# Patient Record
Sex: Female | Born: 1970
Health system: Southern US, Community
[De-identification: ages and names within clinical notes are randomized; demographics above are authoritative.]

## PROBLEM LIST (undated history)

## (undated) ENCOUNTER — Emergency Department (HOSPITAL_COMMUNITY): Payer: PRIVATE HEALTH INSURANCE

## (undated) DIAGNOSIS — Z8489 Family history of other specified conditions: Secondary | ICD-10-CM

## (undated) DIAGNOSIS — M545 Low back pain, unspecified: Secondary | ICD-10-CM

## (undated) DIAGNOSIS — Z9889 Other specified postprocedural states: Secondary | ICD-10-CM

## (undated) DIAGNOSIS — N3010 Interstitial cystitis (chronic) without hematuria: Secondary | ICD-10-CM

## (undated) DIAGNOSIS — G8929 Other chronic pain: Secondary | ICD-10-CM

## (undated) DIAGNOSIS — R002 Palpitations: Secondary | ICD-10-CM

## (undated) DIAGNOSIS — Z5189 Encounter for other specified aftercare: Secondary | ICD-10-CM

## (undated) DIAGNOSIS — L0390 Cellulitis, unspecified: Secondary | ICD-10-CM

## (undated) DIAGNOSIS — T8859XA Other complications of anesthesia, initial encounter: Secondary | ICD-10-CM

## (undated) DIAGNOSIS — B958 Unspecified staphylococcus as the cause of diseases classified elsewhere: Secondary | ICD-10-CM

## (undated) DIAGNOSIS — D649 Anemia, unspecified: Secondary | ICD-10-CM

## (undated) DIAGNOSIS — D689 Coagulation defect, unspecified: Secondary | ICD-10-CM

## (undated) DIAGNOSIS — F909 Attention-deficit hyperactivity disorder, unspecified type: Secondary | ICD-10-CM

## (undated) DIAGNOSIS — R112 Nausea with vomiting, unspecified: Secondary | ICD-10-CM

## (undated) DIAGNOSIS — A419 Sepsis, unspecified organism: Secondary | ICD-10-CM

## (undated) DIAGNOSIS — R51 Headache: Secondary | ICD-10-CM

## (undated) DIAGNOSIS — I2699 Other pulmonary embolism without acute cor pulmonale: Secondary | ICD-10-CM

## (undated) DIAGNOSIS — J189 Pneumonia, unspecified organism: Secondary | ICD-10-CM

## (undated) DIAGNOSIS — I499 Cardiac arrhythmia, unspecified: Secondary | ICD-10-CM

## (undated) DIAGNOSIS — R519 Headache, unspecified: Secondary | ICD-10-CM

## (undated) HISTORY — DX: Other pulmonary embolism without acute cor pulmonale: I26.99

## (undated) HISTORY — DX: Encounter for other specified aftercare: Z51.89

## (undated) HISTORY — DX: Interstitial cystitis (chronic) without hematuria: N30.10

## (undated) HISTORY — DX: Headache, unspecified: R51.9

## (undated) HISTORY — PX: REDUCTION MAMMAPLASTY: SUR839

## (undated) HISTORY — DX: Low back pain, unspecified: M54.50

## (undated) HISTORY — PX: EXPLORATORY LAPAROTOMY: SUR591

## (undated) HISTORY — DX: Anemia, unspecified: D64.9

## (undated) HISTORY — DX: Palpitations: R00.2

## (undated) HISTORY — DX: Headache: R51

## (undated) HISTORY — DX: Coagulation defect, unspecified: D68.9

## (undated) HISTORY — PX: DILATION AND CURETTAGE OF UTERUS: SHX78

## (undated) HISTORY — DX: Cellulitis, unspecified: L03.90

## (undated) HISTORY — PX: UPPER GASTROINTESTINAL ENDOSCOPY: SHX188

## (undated) HISTORY — DX: Other chronic pain: G89.29

## (undated) HISTORY — DX: Sepsis, unspecified organism: A41.9

## (undated) HISTORY — PX: TUBAL LIGATION: SHX77

## (undated) HISTORY — DX: Low back pain: M54.5

## (undated) HISTORY — DX: Headache, unspecified: G89.29

## (undated) HISTORY — DX: Unspecified staphylococcus as the cause of diseases classified elsewhere: B95.8

## (undated) HISTORY — PX: BREAST REDUCTION SURGERY: SHX8

## (undated) HISTORY — DX: Cardiac arrhythmia, unspecified: I49.9

## (undated) HISTORY — PX: RADIOFREQUENCY ABLATION NERVES: SUR1070

---

## 1998-03-06 DIAGNOSIS — I2699 Other pulmonary embolism without acute cor pulmonale: Secondary | ICD-10-CM

## 1998-03-06 HISTORY — DX: Other pulmonary embolism without acute cor pulmonale: I26.99

## 1998-07-20 ENCOUNTER — Inpatient Hospital Stay (HOSPITAL_COMMUNITY): Admission: EM | Admit: 1998-07-20 | Discharge: 1998-08-03 | Payer: Self-pay | Admitting: Emergency Medicine

## 1998-07-20 ENCOUNTER — Encounter: Payer: Self-pay | Admitting: Emergency Medicine

## 1998-07-21 ENCOUNTER — Encounter: Payer: Self-pay | Admitting: Pulmonary Disease

## 1998-07-23 ENCOUNTER — Encounter: Payer: Self-pay | Admitting: Infectious Diseases

## 1998-07-26 ENCOUNTER — Encounter: Payer: Self-pay | Admitting: Pulmonary Disease

## 1998-07-27 ENCOUNTER — Encounter: Payer: Self-pay | Admitting: Pulmonary Disease

## 1998-07-30 ENCOUNTER — Encounter: Payer: Self-pay | Admitting: Pulmonary Disease

## 1998-08-19 ENCOUNTER — Encounter: Payer: Self-pay | Admitting: Emergency Medicine

## 1998-08-19 ENCOUNTER — Emergency Department (HOSPITAL_COMMUNITY): Admission: EM | Admit: 1998-08-19 | Discharge: 1998-08-19 | Payer: Self-pay | Admitting: Emergency Medicine

## 1998-09-01 ENCOUNTER — Encounter: Admission: RE | Admit: 1998-09-01 | Discharge: 1998-09-01 | Payer: Self-pay | Admitting: Infectious Diseases

## 1998-09-29 ENCOUNTER — Encounter: Admission: RE | Admit: 1998-09-29 | Discharge: 1998-09-29 | Payer: Self-pay | Admitting: Infectious Diseases

## 1998-10-18 ENCOUNTER — Ambulatory Visit (HOSPITAL_COMMUNITY): Admission: RE | Admit: 1998-10-18 | Discharge: 1998-10-18 | Payer: Self-pay | Admitting: Pulmonary Disease

## 2001-08-02 ENCOUNTER — Encounter: Admission: RE | Admit: 2001-08-02 | Discharge: 2001-08-02 | Payer: Self-pay | Admitting: *Deleted

## 2002-02-17 ENCOUNTER — Observation Stay (HOSPITAL_COMMUNITY): Admission: AD | Admit: 2002-02-17 | Discharge: 2002-02-18 | Payer: Self-pay | Admitting: Obstetrics and Gynecology

## 2002-02-17 ENCOUNTER — Encounter: Payer: Self-pay | Admitting: Obstetrics and Gynecology

## 2002-07-03 ENCOUNTER — Inpatient Hospital Stay (HOSPITAL_COMMUNITY): Admission: AD | Admit: 2002-07-03 | Discharge: 2002-07-03 | Payer: Self-pay | Admitting: Obstetrics and Gynecology

## 2002-08-06 ENCOUNTER — Encounter (INDEPENDENT_AMBULATORY_CARE_PROVIDER_SITE_OTHER): Payer: Self-pay

## 2002-08-06 ENCOUNTER — Inpatient Hospital Stay (HOSPITAL_COMMUNITY): Admission: AD | Admit: 2002-08-06 | Discharge: 2002-08-08 | Payer: Self-pay | Admitting: Obstetrics & Gynecology

## 2002-08-09 ENCOUNTER — Encounter: Admission: RE | Admit: 2002-08-09 | Discharge: 2002-09-08 | Payer: Self-pay | Admitting: Obstetrics & Gynecology

## 2002-10-09 ENCOUNTER — Encounter: Admission: RE | Admit: 2002-10-09 | Discharge: 2002-11-08 | Payer: Self-pay | Admitting: Obstetrics & Gynecology

## 2002-10-23 ENCOUNTER — Other Ambulatory Visit: Admission: RE | Admit: 2002-10-23 | Discharge: 2002-10-23 | Payer: Self-pay | Admitting: Obstetrics & Gynecology

## 2002-11-09 ENCOUNTER — Encounter: Admission: RE | Admit: 2002-11-09 | Discharge: 2002-12-09 | Payer: Self-pay | Admitting: Obstetrics & Gynecology

## 2005-05-04 ENCOUNTER — Ambulatory Visit: Payer: Self-pay | Admitting: Family Medicine

## 2005-05-05 ENCOUNTER — Ambulatory Visit: Payer: Self-pay | Admitting: Family Medicine

## 2005-07-03 ENCOUNTER — Ambulatory Visit: Payer: Self-pay | Admitting: Family Medicine

## 2006-03-22 ENCOUNTER — Encounter (INDEPENDENT_AMBULATORY_CARE_PROVIDER_SITE_OTHER): Payer: Self-pay | Admitting: *Deleted

## 2006-03-22 ENCOUNTER — Ambulatory Visit (HOSPITAL_BASED_OUTPATIENT_CLINIC_OR_DEPARTMENT_OTHER): Admission: RE | Admit: 2006-03-22 | Discharge: 2006-03-22 | Payer: Self-pay | Admitting: Urology

## 2006-04-26 ENCOUNTER — Ambulatory Visit: Payer: Self-pay | Admitting: Family Medicine

## 2006-04-26 LAB — CONVERTED CEMR LAB
ALT: 11 units/L (ref 0–40)
Bilirubin, Direct: 0.1 mg/dL (ref 0.0–0.3)
CO2: 30 meq/L (ref 19–32)
Cholesterol: 164 mg/dL (ref 0–200)
GFR calc Af Amer: 124 mL/min
GFR calc non Af Amer: 102 mL/min
Glucose, Bld: 92 mg/dL (ref 70–99)
HDL: 49.3 mg/dL (ref >39.0)
LDL Cholesterol: 101 mg/dL — ABNORMAL HIGH (ref 0–99)
Sodium: 142 meq/L (ref 135–145)
TSH: 0.99 microintl units/mL (ref 0.35–5.50)
Total CHOL/HDL Ratio: 3.3
Total Protein: 7.1 g/dL (ref 6.0–8.3)
Triglycerides: 71 mg/dL (ref 0–149)

## 2006-05-08 ENCOUNTER — Ambulatory Visit: Payer: Self-pay | Admitting: Family Medicine

## 2006-08-01 ENCOUNTER — Ambulatory Visit: Payer: Self-pay | Admitting: Family Medicine

## 2006-08-02 LAB — CONVERTED CEMR LAB
AST: 18 units/L (ref 0–37)
Albumin: 3.8 g/dL (ref 3.5–5.2)
Alkaline Phosphatase: 58 units/L (ref 39–117)
BUN: 7 mg/dL (ref 6–23)
Basophils Absolute: 0 10*3/uL (ref 0.0–0.1)
Basophils Relative: 0.4 % (ref 0.0–1.0)
CO2: 29 meq/L (ref 19–32)
Chloride: 106 meq/L (ref 96–112)
Creatinine, Ser: 0.7 mg/dL (ref 0.4–1.2)
HCT: 42.5 % (ref 36.0–46.0)
Hemoglobin: 14.5 g/dL (ref 12.0–15.0)
MCHC: 34.1 g/dL (ref 30.0–36.0)
Monocytes Absolute: 0.4 10*3/uL (ref 0.2–0.7)
Monocytes Relative: 6.9 % (ref 3.0–11.0)
Neutrophils Relative %: 78.8 % — ABNORMAL HIGH (ref 43.0–77.0)
RBC: 4.79 M/uL (ref 3.87–5.11)
RDW: 12.2 % (ref 11.5–14.6)
Total Bilirubin: 0.6 mg/dL (ref 0.3–1.2)
Total Protein: 6.8 g/dL (ref 6.0–8.3)

## 2006-09-04 ENCOUNTER — Emergency Department (HOSPITAL_COMMUNITY): Admission: EM | Admit: 2006-09-04 | Discharge: 2006-09-04 | Payer: Self-pay | Admitting: Emergency Medicine

## 2006-09-05 ENCOUNTER — Ambulatory Visit: Payer: Self-pay | Admitting: Family Medicine

## 2006-10-19 DIAGNOSIS — F329 Major depressive disorder, single episode, unspecified: Secondary | ICD-10-CM

## 2006-10-19 DIAGNOSIS — I10 Essential (primary) hypertension: Secondary | ICD-10-CM

## 2006-10-19 DIAGNOSIS — R51 Headache: Secondary | ICD-10-CM

## 2006-10-19 DIAGNOSIS — R519 Headache, unspecified: Secondary | ICD-10-CM | POA: Insufficient documentation

## 2006-12-13 ENCOUNTER — Telehealth: Payer: Self-pay | Admitting: Family Medicine

## 2006-12-18 ENCOUNTER — Telehealth: Payer: Self-pay | Admitting: Family Medicine

## 2007-03-21 ENCOUNTER — Telehealth: Payer: Self-pay | Admitting: Family Medicine

## 2007-04-09 ENCOUNTER — Encounter: Admission: RE | Admit: 2007-04-09 | Discharge: 2007-04-09 | Payer: Self-pay | Admitting: Obstetrics & Gynecology

## 2007-07-25 ENCOUNTER — Ambulatory Visit: Payer: Self-pay | Admitting: Family Medicine

## 2007-07-25 DIAGNOSIS — H8110 Benign paroxysmal vertigo, unspecified ear: Secondary | ICD-10-CM

## 2007-09-04 ENCOUNTER — Ambulatory Visit: Payer: Self-pay | Admitting: Family Medicine

## 2007-09-05 LAB — CONVERTED CEMR LAB
AST: 15 units/L (ref 0–37)
Albumin: 4.1 g/dL (ref 3.5–5.2)
Alkaline Phosphatase: 46 units/L (ref 39–117)
BUN: 13 mg/dL (ref 6–23)
CO2: 32 meq/L (ref 19–32)
Chloride: 104 meq/L (ref 96–112)
Eosinophils Absolute: 0.1 10*3/uL (ref 0.0–0.7)
Eosinophils Relative: 1.9 % (ref 0.0–5.0)
GFR calc non Af Amer: 102 mL/min
Hemoglobin, Urine: NEGATIVE
LDL Cholesterol: 96 mg/dL (ref 0–99)
Leukocytes, UA: NEGATIVE
Lymphocytes Relative: 39.5 % (ref 12.0–46.0)
MCV: 91.8 fL (ref 78.0–100.0)
Neutrophils Relative %: 49.4 % (ref 43.0–77.0)
Nitrite: NEGATIVE
Platelets: 292 10*3/uL (ref 150–400)
Potassium: 4.1 meq/L (ref 3.5–5.1)
Specific Gravity, Urine: 1.015 (ref 1.000–1.03)
Total Bilirubin: 0.8 mg/dL (ref 0.3–1.2)
Total CHOL/HDL Ratio: 2.7
Urine Glucose: NEGATIVE mg/dL
Urobilinogen, UA: 0.2 (ref 0.0–1.0)
VLDL: 6 mg/dL (ref 0–40)
WBC: 4 10*3/uL — ABNORMAL LOW (ref 4.5–10.5)

## 2007-09-09 ENCOUNTER — Ambulatory Visit: Payer: Self-pay | Admitting: Family Medicine

## 2007-09-09 DIAGNOSIS — G4700 Insomnia, unspecified: Secondary | ICD-10-CM | POA: Insufficient documentation

## 2007-09-09 DIAGNOSIS — H5710 Ocular pain, unspecified eye: Secondary | ICD-10-CM

## 2007-09-10 ENCOUNTER — Encounter: Payer: Self-pay | Admitting: Family Medicine

## 2007-10-28 ENCOUNTER — Telehealth: Payer: Self-pay | Admitting: Family Medicine

## 2007-12-31 ENCOUNTER — Telehealth: Payer: Self-pay | Admitting: Family Medicine

## 2008-01-01 ENCOUNTER — Ambulatory Visit: Payer: Self-pay | Admitting: Family Medicine

## 2008-01-01 DIAGNOSIS — R42 Dizziness and giddiness: Secondary | ICD-10-CM | POA: Insufficient documentation

## 2008-01-03 ENCOUNTER — Encounter: Admission: RE | Admit: 2008-01-03 | Discharge: 2008-01-03 | Payer: Self-pay | Admitting: Family Medicine

## 2008-01-07 ENCOUNTER — Telehealth: Payer: Self-pay | Admitting: Family Medicine

## 2008-01-08 ENCOUNTER — Ambulatory Visit: Payer: Self-pay | Admitting: Family Medicine

## 2008-01-08 DIAGNOSIS — J209 Acute bronchitis, unspecified: Secondary | ICD-10-CM | POA: Insufficient documentation

## 2008-02-05 ENCOUNTER — Encounter: Payer: Self-pay | Admitting: Family Medicine

## 2008-02-21 ENCOUNTER — Telehealth: Payer: Self-pay | Admitting: Family Medicine

## 2008-03-20 ENCOUNTER — Ambulatory Visit: Payer: Self-pay | Admitting: Family Medicine

## 2008-03-20 DIAGNOSIS — J029 Acute pharyngitis, unspecified: Secondary | ICD-10-CM | POA: Insufficient documentation

## 2008-04-22 ENCOUNTER — Ambulatory Visit: Payer: Self-pay | Admitting: Family Medicine

## 2008-07-10 ENCOUNTER — Telehealth: Payer: Self-pay | Admitting: Family Medicine

## 2009-05-14 ENCOUNTER — Ambulatory Visit: Payer: Self-pay | Admitting: Family Medicine

## 2009-05-14 DIAGNOSIS — F411 Generalized anxiety disorder: Secondary | ICD-10-CM | POA: Insufficient documentation

## 2009-05-14 DIAGNOSIS — F909 Attention-deficit hyperactivity disorder, unspecified type: Secondary | ICD-10-CM | POA: Insufficient documentation

## 2009-05-28 ENCOUNTER — Telehealth: Payer: Self-pay | Admitting: Family Medicine

## 2009-08-04 ENCOUNTER — Telehealth: Payer: Self-pay | Admitting: Family Medicine

## 2009-08-05 ENCOUNTER — Ambulatory Visit: Payer: Self-pay | Admitting: Family Medicine

## 2009-08-05 DIAGNOSIS — R609 Edema, unspecified: Secondary | ICD-10-CM

## 2010-04-05 NOTE — Assessment & Plan Note (Signed)
Summary: bilateral leg swelling/dm   Vital Signs:  Patient profile:   40 year old female Weight:      205 pounds Temp:     98 degrees F oral BP sitting:   130 / 82  (right arm) Cuff size:   large  Vitals Entered By: Sid Falcon LPN (August 05, 452 2:34 PM) CC: Bil leg swelling   History of Present Illness: Last Friday severe sunburn, esp legs.  2 days later edema legs.  No pain. Has tried ice and Celebrex without relief.  Denies dyspnea, fatigue, generalized edeama. Edema slightly worse late in day.     Allergies (verified): No Known Drug Allergies  Past History:  Past Medical History: Last updated: 09/09/2007 Hypertension Insomnia ADHD, hx of Sinusitis Depression Headache sees Dr. Seymour Bars for gyn exams interstitial cystitis, sees Dr. Heloise Purpura  Review of Systems  The patient denies anorexia, fever, weight loss, weight gain, chest pain, syncope, and dyspnea on exertion.    Physical Exam  General:  Well-developed,well-nourished,in no acute distress; alert,appropriate and cooperative throughout examination Neck:  No deformities, masses, or tenderness noted. Lungs:  Normal respiratory effort, chest expands symmetrically. Lungs are clear to auscultation, no crackles or wheezes. Heart:  normal rate, regular rhythm, and no murmur.  normal rate, regular rhythm, and no murmur.   Extremities:  sunburn lower legs bil.  No vesicles.  Trace pitting edema.  Good distal foot pulses. Neurologic:  strength normal in all extremities and sensation intact to light touch.  strength normal in all extremities and sensation intact to light touch.     Impression & Recommendations:  Problem # 1:  LEG EDEMA (ICD-782.3) secondary to sunburn.  Avoid Celebrex to avoid further fluid retention.  Elevate and f/u with primary 2 weeks if no better.  Complete Medication List: 1)  Concerta 54 Mg Tbcr (Methylphenidate hcl) .... Once daily 2)  Alprazolam 0.5 Mg Tabs (Alprazolam) .... Three  times a day as needed  for anxiety 3)  Flexeril 10 Mg Tabs (Cyclobenzaprine hcl) .... Three times a day as needed spasm  Patient Instructions: 1)  Elevate legs frequently. 2)  Stop Celebrex. 3)  Avoid further sun exposure until  this has healed. 4)  Follow up with Dr Clent Ridges if edema not resolved in 2 weeks.

## 2010-04-05 NOTE — Assessment & Plan Note (Signed)
Summary: fu on meds/njr   Vital Signs:  Patient profile:   40 year old female Height:      65.5 inches Weight:      206 pounds BMI:     33.88 Temp:     98.7 degrees F oral BP sitting:   122 / 82  (left arm) Cuff size:   large  Vitals Entered By: Alfred Levins, CMA (May 14, 2009 12:12 PM) CC: renew meds   History of Present Illness: Here for follow up on ADHD, insomnia, and anxiety. Lest year when she took Concerta and Seroquel every day, she felt the best she has felt for years. However she cannot afford to take both of these. Tried Temazepam, but still can't sleep all that well. Now tha tshe is off Concerta, she is very stressed all the time, can't get anything done at home or on the job.   Current Medications (verified): 1)  Concerta 54 Mg  Tbcr (Methylphenidate Hcl) .... Once Daily, May Fill On 09-09-08  Allergies (verified): No Known Drug Allergies  Past History:  Past Medical History: Reviewed history from 09/09/2007 and no changes required. Hypertension Insomnia ADHD, hx of Sinusitis Depression Headache sees Dr. Seymour Bars for gyn exams interstitial cystitis, sees Dr. Heloise Purpura  Review of Systems  The patient denies anorexia, fever, weight loss, weight gain, vision loss, decreased hearing, hoarseness, chest pain, syncope, dyspnea on exertion, peripheral edema, prolonged cough, headaches, hemoptysis, abdominal pain, melena, hematochezia, severe indigestion/heartburn, hematuria, incontinence, genital sores, muscle weakness, suspicious skin lesions, transient blindness, difficulty walking, depression, unusual weight change, abnormal bleeding, enlarged lymph nodes, angioedema, breast masses, and testicular masses.    Physical Exam  General:  Well-developed,well-nourished,in no acute distress; alert,appropriate and cooperative throughout examination Neurologic:  No cranial nerve deficits noted. Station and gait are normal. Plantar reflexes are down-going bilaterally.  DTRs are symmetrical throughout. Sensory, motor and coordinative functions appear intact. Psych:  Oriented X3, memory intact for recent and remote, normally interactive, good eye contact, and moderately anxious.     Impression & Recommendations:  Problem # 1:  INSOMNIA (ICD-780.52)  The following medications were removed from the medication list:    Temazepam 30 Mg Caps (Temazepam) .Marland Kitchen... At bedtime  Problem # 2:  HYPERTENSION (ICD-401.9)  Problem # 3:  ADHD (ICD-314.01)  Problem # 4:  ANXIETY (ICD-300.00)  Her updated medication list for this problem includes:    Alprazolam 0.5 Mg Tabs (Alprazolam) .Marland Kitchen... Three times a day as needed  for anxiety  Complete Medication List: 1)  Concerta 54 Mg Tbcr (Methylphenidate hcl) .... Once daily, may fill on 07-14-09 2)  Alprazolam 0.5 Mg Tabs (Alprazolam) .... Three times a day as needed  for anxiety 3)  Flexeril 10 Mg Tabs (Cyclobenzaprine hcl) .... Three times a day as needed spasm  Patient Instructions: 1)  Get back on Concerta. Try Flexeril for tension HAs. Try Xanax for sleep and for anxiety.  2)  Please schedule a follow-up appointment as needed .  Prescriptions: FLEXERIL 10 MG TABS (CYCLOBENZAPRINE HCL) three times a day as needed spasm  #90 x 5   Entered and Authorized by:   Nelwyn Salisbury MD   Signed by:   Nelwyn Salisbury MD on 05/14/2009   Method used:   Print then Give to Patient   RxID:   [id number] ALPRAZOLAM 0.5 MG TABS (ALPRAZOLAM) three times a day as needed  for anxiety  #90 x 5   Entered and Authorized by:   Tera Mater  Clent Ridges MD   Signed by:   Nelwyn Salisbury MD on 05/14/2009   Method used:   Print then Give to Patient   RxID:   [id number] CONCERTA 54 MG  TBCR (METHYLPHENIDATE HCL) once daily, may fill on 07-14-09  #30 x 0   Entered and Authorized by:   Nelwyn Salisbury MD   Signed by:   Nelwyn Salisbury MD on 05/14/2009   Method used:   Print then Give to Patient   RxID:   [id number] CONCERTA 54 MG  TBCR  (METHYLPHENIDATE HCL) once daily, may fill on 06-14-09  #30 x 0   Entered and Authorized by:   Nelwyn Salisbury MD   Signed by:   Nelwyn Salisbury MD on 05/14/2009   Method used:   Print then Give to Patient   RxID:   [id number] CONCERTA 54 MG  TBCR (METHYLPHENIDATE HCL) once daily  #30 x 0   Entered and Authorized by:   Nelwyn Salisbury MD   Signed by:   Nelwyn Salisbury MD on 05/14/2009   Method used:   Print then Give to Patient   RxID:   [id number]

## 2010-04-05 NOTE — Progress Notes (Signed)
Summary: REQ FOR MED / RX  Phone Note Call from Patient   Caller: Patient  [phone] Reason for Call: Refill Medication, Talk to Nurse, Talk to Doctor Summary of Call: Pt called to adv that she sent in her 3 prescriptions (for 3 mths / 90-days) for med:  Concerta 54 Mg Tbcr (Methylphenidate hcl)  to [facility].... [facility] filled the first script for 30-days but disregarded the other 2 prescriptions (pt was told that they would have been thrown away because they don't keep Rx's on file?) [facility] told pt that the prescription date was the same on all 3 scripts....[facility] sent her 1 bottle of 1 (30 days worth), w/ no refills..... Pt adv that [facility] told her that they will have to have a 90-day / 3 mth prescription for the med instead of 3 separate because they don't keep Rx that are being not being filled??.... Pt states that she tried to explain to them that the med Rx is given in 3 separate scripts for reasons of it being a controlled substance but [facility] would not acknowledge the pts statement.... Pt now needs a Rx for April / May before her medication runs out in 2 wks.  Pt can be reached [phone] with any questions or concerns.  Initial call taken by: Debbra Riding,  May 28, 2009 8:13 AM  Follow-up for Phone Call        no problem. Wrote a rx for the whole 90 on one script Follow-up by: Nelwyn Salisbury MD,  May 28, 2009 1:47 PM  Additional Follow-up for Phone Call Additional follow up Details #1::        Phone Call Completed Additional Follow-up by: Raechel Ache, RN,  May 28, 2009 1:51 PM    New/Updated Medications: CONCERTA 54 MG  TBCR (METHYLPHENIDATE HCL) once daily Prescriptions: CONCERTA 54 MG  TBCR (METHYLPHENIDATE HCL) once daily  #90 x 0   Entered and Authorized by:   Nelwyn Salisbury MD   Signed by:   Nelwyn Salisbury MD on 05/28/2009   Method used:   Print then Give to Patient   RxID:   [id number]

## 2010-04-05 NOTE — Progress Notes (Signed)
Summary: bilateral leg swelling  Phone Note Call from Patient   Caller: Patient Call For: Nelwyn Salisbury MD Summary of Call: Pt got sunburned this weekend,  but has swelling in calves and legs with no apparent bad burn.  No SOB.  Left ear has decreased hearing..... No chest pain.  Appt scheduled tomorrow. Initial call taken by: Lynann Beaver CMA,  August 04, 2009 4:43 PM  Follow-up for Phone Call        noted Follow-up by: Nelwyn Salisbury MD,  August 04, 2009 5:31 PM

## 2010-04-27 ENCOUNTER — Other Ambulatory Visit: Payer: Self-pay | Admitting: Family Medicine

## 2010-04-27 MED ORDER — METHYLPHENIDATE HCL ER (OSM) 54 MG PO TBCR: 54.0000 mg | EXTENDED_RELEASE_TABLET | Freq: Every day | ORAL | Status: AC

## 2010-04-27 MED ORDER — CYCLOBENZAPRINE HCL 10 MG PO TABS: 10.0000 mg | ORAL_TABLET | Freq: Three times a day (TID) | ORAL | Status: AC | PRN

## 2010-04-27 MED ORDER — ALPRAZOLAM 0.5 MG PO TABS
0.5000 mg | ORAL_TABLET | Freq: Three times a day (TID) | ORAL | Status: AC | PRN
Start: 2010-04-27 — End: 2011-04-27

## 2010-04-27 NOTE — Telephone Encounter (Signed)
Pt needs to pick up a written script for meds: flexeril, alprazolam, concerta..No data on file. Med for  Flexeril and alprazolam..... Please write for 90-day supply so she can send same to [facility].Marland KitchenMarland KitchenMarland Kitchen# [id number].

## 2010-04-27 NOTE — Telephone Encounter (Signed)
Last ov when you saw her was March 2011

## 2010-04-27 NOTE — Telephone Encounter (Signed)
Pt notified rx ready for pick up for flexeril concerta and alprazolam

## 2010-04-27 NOTE — Telephone Encounter (Signed)
All written and in your box

## 2010-07-22 NOTE — Op Note (Signed)
[name], [name]             ACCOUNT NO.:  [id number]   MEDICAL RECORD NO.:  [id number]          PATIENT TYPE:  HAMB   LOCATION:                               FACILITY:  [facility]   PHYSICIAN:  Heloise Purpura, MD      DATE OF BIRTH:  1971-02-04   DATE OF PROCEDURE:  03/22/2006  DATE OF DISCHARGE:                               OPERATIVE REPORT   PREOPERATIVE DIAGNOSIS:  Pelvic pain.   POSTOPERATIVE DIAGNOSIS:  Pelvic pain.   PROCEDURE:  1. Cystoscopy.  2. Saline bladder washing.  3. Hydrodistention.   SURGEON:  Crecencio Mc, M.D.   ANESTHESIA:  General.   COMPLICATIONS:  None.   INDICATION:  [name] is a 40 year old female with a long history of  pelvic and suprapubic pain.  She also has significant urinary urgency  and frequency.  The patient has been felt, in the past by other  physicians, to possibly have interstitial cystitis.  However, she has  not proceeded with further evaluation up until recently.  After  discussing options with the patient, the patient elected to proceed with  cystoscopy and hydrodistention for both therapeutic and diagnostic  purposes.  Potential risks and benefits were discussed with the patient  and she consented.   DESCRIPTION OF PROCEDURE:  The patient was taken to the operating room  and a general anesthetic was administered.  She was given preoperative  antibiotics, placed in the dorsal lithotomy position, and prepped and  draped in the usual sterile fashion.   Next cystourethroscopy was performed.  This demonstrated a normal  urethra.  The ureteral orifices were identified in their normal anatomic  position, and effluxing clear urine.  The bladder was carefully  examined.  There was no evidence of any bladder tumors, stones, or other  mucosal pathology.   At this point the bladder was then allowed to fill passively at 80 cm of  water pressure.  The bladder was then emptied; and the bladder capacity  was measured to be 800 mL.   Reinspection of the bladder revealed  multiple glomerulations throughout the bladder.  There was specifically,  no evidence of ulceration.  A saline bladder washing also had been  obtained on the initial cystoscopy.  At this point the bladder was  redistended; and left distended for 8  minutes.  Following this time, the bladder was emptied; and the  procedure was ended.  The patient appeared to tolerate the procedure  well and without complications.  She was able to be awakened and  transferred to the recovery unit in satisfactory condition.           ______________________________  Heloise Purpura, MD  Electronically Signed     LB/MEDQ  D:  03/22/2006  T:  03/22/2006  Job:  [id number]

## 2010-07-22 NOTE — Op Note (Signed)
   [name], [name]                       ACCOUNT NO.:  [id number]   MEDICAL RECORD NO.:  [id number]                   PATIENT TYPE:  INP   LOCATION:  9132                                 FACILITY:  [facility]   PHYSICIAN:  Genia Del, M.D.             DATE OF BIRTH:  Mar 28, 1970   DATE OF PROCEDURE:  08/06/2002  DATE OF DISCHARGE:                                 OPERATIVE REPORT   PREOPERATIVE DIAGNOSIS:  Desire for postpartum bilateral tubal  sterilization.   POSTOPERATIVE DIAGNOSIS:  Desire for postpartum bilateral tubal  sterilization.   PROCEDURE:  Postpartum bilateral tubal sterilization by mini-laparotomy with  modified Pomeroy technique.   SURGEON:  Genia Del, M.D.   ANESTHESIOLOGIST:  Belva Agee, M.D.   DESCRIPTION OF PROCEDURE:  Under epidural anesthesia, the patient was in the  dorsal position.  She was prepped with Hibiclens on the abdominal area and  draped as usual.  An infraumbilical incision is done with the scalpel over 2  cm.  The aponeurosis is opened transversely over 2 cm with the Mayo scissors  and the parietal peritoneum is opened with blunt dissection.  We put the  retractors in place and moved the incision toward the left tube.  The left  tube is grasped with a Babcock and we identified the tube by seeing the  fimbriae distally.  A window is opened at the level of the mesosalpinx.  We  then passed a plain 0 and attached the distal part of the tube at about 3 cm  from the cornua.  A second suture  is passed around the proximal cut of the  tube at about 2 cm from the cornua and attached.  We then transect the tube  between the two sutures with the Metzenbaum scissors and a portion of the  tube is sent to pathology.  Both cut extremities are cauterized with  electrocautery.  Hemostasis is adequate at both levels.  The sutures are  cut.  We proceeded exactly the same way on the right tube.  Hemostasis is  adequate.  We then closed the  aponeurosis with a running suture of 0 Vicryl.  We had complete hemostasis at the level of the subcutaneous tissue with the  electrocautery and we closed the skin with a subcuticular 4-0 Monocryl.  Hemostasis is adequate.  The estimated blood loss was minimal.  No  complications occurred.  The patient was transferred to the recovery room in  good condition.                                               Genia Del, M.D.    ML/MEDQ  D:  08/06/2002  T:  08/06/2002  Job:  [id number]

## 2010-07-22 NOTE — Assessment & Plan Note (Signed)
[facility]                            [facility] OFFICE NOTE   [name]                    MRN:          [id number]  DATE:04/26/2006                            DOB:          15-Jun-1970    This is a 40 year old woman here for a nongynecological physical  examination.  It has been several years since she has had a physical of  this nature.  She has quite a few questions today, and a lot of medical  issues to discuss.  First off, for 10 days she has had headaches, sinus  pressure, postnasal drainage, nonproductive cough, and a low-grade  fever.  She is using over-the-counter agents like Mucinex and drinking  fluids.  Secondly, she has chronic difficulties with sleep.  She has  used Ambien and Lunesta in the past.  Not only did these not work well,  but they made her feel strange with side effects during the day.  She is  interested in trying something else.  She has also tried Valium and  Xanax in the past, and they were not effective either.  Additionally,  she has history of adult ADHD.  We had given her Strattera for about 2  to 2-1/2 years.  She thought this helped slightly, but really did not  help her ADHD that much.  She thought is caused some sedation and made  her feel strange during the day as well.  It certainly did not help her  sleep.  Lastly, she has a history of interstitial cystitis.  She was  recently transferred to Dr. Heloise Purpura for urologic care.  About 3  weeks ago she had cystoscopy and hydrodistention performed on her  bladder.  She thinks this may have helped.  Also, she has a history of  hypertension and of some type of heart arrhythmia.  I am not exactly  clear what this entailed, but she was on medications for a while for  this. She has been off them for some time now.  Over the past 6 months,  she describes episodes occurring once or twice a month of a mild  squeezing type of pressure in her left chest.  This  is accompanied with  shortness of breath, sweats, and occasionally some nausea without  vomiting.  This is not associated with exertion.  It lasts anywhere from  5 minutes to 1 hour at a time, then it goes away.  She does not feel any  skipping, palpitations or racing of heart during these episodes.  She  denies any indigestion or heartburn.  No difficulty swallowing.  She  thinks anxiety may play a role in these episodes, but she is not sure.   For other details of her past medical history, family history, social  history, habits, etc., I refer you to our introductory note with her  dated September 14, 2003.   ALLERGIES:  NONE.   CURRENT MEDICATIONS:  None.   OBJECTIVE:  Height 5 feet 5 inches.  Weight 207.  BP 118/74.  Pulse 80  and regular.  Temperature 97.9 degrees.  GENERAL:  She is  in no acute distress, although she coughs occasionally,  and she is hoarse.  She is quite overweight.  SKIN:  Shows a large number of benign lesions including nevi, seborrheic  keratoses, and freckles.  She does have a suspicious macular lesion on  the medial side of her right knee, however.  It is about 3 cm across.  It is quite nonuniform in its colorations, ranging from light tan to  dark brown, almost black.  The margins are indistinct.  She says it has  been changing in color over the past several months.  Otherwise, eyes are clear.  Ears are clear.  Pharynx is clear.  NECK:  Supple without lymphadenopathy or masses.  LUNGS:  Clear.  CARDIAC:  Rate and rhythm regular without gallops, murmurs or rubs.  Distal pulses are full.  EKG is within normal limits.  ABDOMEN:  Soft.  Normal bowel sounds.  Non-tender.  No masses.  EXTREMITIES:  No clubbing, cyanosis or edema.  NEUROLOGIC EXAM:  Grossly intact.   ASSESSMENT AND PLAN:  1. Complete physical.  We talked about increasing exercise and losing      weight.  She is fasting, so we will check the usual laboratories.  2. History of attention deficit  hyperactivity disorder.  Apparently      stable off medications.  3. Insomnia.  I gave her samples to try Seroquel 50 mg 1 at bedtime      over the next 12 days.  She is to call me and let me know how this      is working.  4. Sinusitis.  I gave her a Z-Pak.  5. Chest pains.  Possibly related to esophageal spasms.  I gave her      samples of Protonix 40 mg.  She is to take this once a day over the      next month.  If this is not successful, we will continue our      workup.  6. Interstitial cystitis.  Per Urology.  7. Suspicious lesion on the right leg.  She will return soon to have      this excised.     Tera Mater. Clent Ridges, MD  Electronically Signed    SAF/MedQ  DD: 04/26/2006  DT: 04/26/2006  Job #: [id number]

## 2010-07-22 NOTE — H&P (Signed)
NAMEJABREE, [name]                       ACCOUNT NO.:  [id number]   MEDICAL RECORD NO.:  [id number]                   PATIENT TYPE:  INP   LOCATION:  9167                                 FACILITY:  [facility]   PHYSICIAN:  Genia Del, M.D.             DATE OF BIRTH:  Oct 04, 1970   DATE OF ADMISSION:  08/06/2002  DATE OF DISCHARGE:                                HISTORY & PHYSICAL   IDENTIFYING INFORMATION:  [name] is a 40 year old G4, P2, A1, expected  date of delivery on 08/12/2002, at 39 weeks and 1 day gestation.   REASON FOR ADMISSION:  Induction; history of pulmonary embolism on Lovenox.   HISTORY OF PRESENT ILLNESS:  Fetal movement positive, no regular uterine  contractions, no vaginal bleeding, no fluid leak, no PIH symptoms, no  abnormal bleeding except for easy bruising on Lovenox.   PAST OBSTETRICAL HISTORY:  G1 June 1991, 18 weeks, therapeutic abortion, no  complications.  February 1994, 40-week spontaneous vaginal delivery of baby  girl 8 pounds 7 ounces.  April 1996, 40-week spontaneous vaginal delivery of  baby boy 7 pounds 6 ounces.   PAST MEDICAL HISTORY:  1. Pulmonary embolism postop breast reduction.  2. History of asthma.  3. History of pregnancy-induced hypertension in 1994.  4. History of cystitis.   PAST SURGICAL HISTORY:  1. Therapeutic abortion June 1991.  2. Breast reduction in 2000 complicated by pulmonary embolism.  3. Unilateral ovarian laparotomy in 1998.  4. Pilonidal cyst in 1997.   FAMILY HISTORY:  Positive for cardiovascular disease, chronic hypertension,  diabetes mellitus.   ALLERGIES:  No known drug allergies   MEDICATIONS:  1. Prenatal vitamins.  2. Zoloft 100 daily.  3. Lovenox 40 mg subcutaneously daily.  4. Ativan 1 mg daily. p.r.n.   SOCIAL HISTORY:  Married, two children, nonsmoker.   HISTORY OF PRESENT PREGNANCY:  First trimester was normal.  The patient  presented at around 12 weeks.  There were difficulties  between patient and  husband who did not want a third baby at first.  Through counseling and with  time, the problems resolved, and the husband was supportive by the end of  pregnancy.  The patient did better on Zoloft.  Consultation with hematology  was done because of history of pulmonary embolism, and the patient was  started on Lovenox which she took throughout her pregnancy.  In the second  trimester, triple tests was within normal limits.  Ultrasound of uterine  anatomy was within normal limits at 20+ weeks.  Placenta was frontal and  normal.  Amniotic fluid normal.  Cervix 4.7 cm and closed.  In the third  trimester, one-hour GTT was normal.  Uterine height responded well  throughout pregnancy.  Blood pressure remained normal.  The patient had no  complication associated with Lovenox.  Her thrombocytic workup was negative.  Group B strep was positive at 35+ weeks.  Labs in the first  trimester showed  hemoglobin at 13.5, platelets 267, blood group O positive, Rh antibody  negative, toxoplasmosis negative, syphilis nonreactive, HBsAg negative, HIV  nonreactive, and rubella immune.   REVIEW OF SYSTEMS:  CONSTITUTIONAL:  Negative.  HEENT: Negative.  CARDIOVASCULAR AND RESPIRATORY: Negative.  GI: Negative.  DERMATOLOGIC,  ENDOCRINE, AND NEUROLOGIC:  Negative.   PHYSICAL EXAMINATION:  GENERAL:  No apparent distress.  VITAL SIGNS:  On admission, blood pressure in the 110s to 120s/70s.  Pulse  regular, normal.  Respiratory rate 20, temperature afebrile.  LUNGS:  Clear bilaterally.  HEART:  Regular cardiac rhythm.  ABDOMEN:  Gravid, cephalic presentation.  PELVIC:  Vaginal exam on admission was 3+, 80 to 90%, vertex, -1, membranes  intact.  Lower limbs normal.  Monitoring: Fetal heart rate reactive,  baseline 140s, no deceleration.  Irregular mild uterine contractions.   IMPRESSION:  Gravida 1, para 2, abortions 1 at 39+ weeks gestation, fetal  well being reassuring, group B  Streptococcus positive, admitted for  induction because of history of pulmonary embolism on Lovenox.  Last dose of  Lovenox was Aug 04, 2002.   PLAN:  Admit to labor and delivery, monitoring, start penicillin G protocol  for group B strep positive, expectant management for probable delivery.  Note that the patient also desires a postpartum tubal ligation; we will  attempt to organize that under epidural.  Note, informed consent was  obtained.                                               Genia Del, M.D.    ML/MEDQ  D:  08/06/2002  T:  08/06/2002  Job:  [id number]

## 2010-07-22 NOTE — H&P (Signed)
   [name], [name]                       ACCOUNT NO.:  [id number]   MEDICAL RECORD NO.:  [id number]                   PATIENT TYPE:  MAT   LOCATION:  [facility]                                 FACILITY:  [facility]   PHYSICIAN:  Lenoard Aden, M.D.             DATE OF BIRTH:  1970-04-30   DATE OF ADMISSION:  02/17/2002  DATE OF DISCHARGE:                                HISTORY & PHYSICAL   CHIEF COMPLAINT:  Nausea and vomiting.   HISTORY OF PRESENT ILLNESS:  The patient is a 40 year old white female G5,  P2 at [redacted] weeks gestation with nausea and vomiting over the last three to  four days and persistent diarrhea.   MEDICATIONS:  Phenergan and prenatal vitamins, poorly tolerated, and Zoloft  and Ambien as needed.   PAST MEDICAL HISTORY:  Remarkable for depression, ovarian cyst surgery,  pilonidal cyst surgery, breast reduction x3 with septic shock and pulmonary  embolism, vaginal delivery x2, termination x2.   REVIEW OF SYSTEMS:  Profound diarrhea, nausea, and vomiting.  No vaginal  bleeding.  No abdominal pain.  The patient's work-up to include coagulopathy  work-up pending by Dr. Myna Hidalgo.  The patient to start Lovenox reportedly in  the near future.   PHYSICAL EXAMINATION:  GENERAL:  She is an ill-appearing white female in no  apparent distress.  VITAL SIGNS:  Temperature 98.6, pulse 84, respirations 16, blood pressure  108/46.  HEENT:  Normal.  LUNGS:  Clear.  HEART:  Regular rate and rhythm.  ABDOMEN:  Soft, nontender.  Uterus consistent with 14 weeks.  No rebound,  guarding.  Normal bowel sounds.  PELVIC:  Deferred.  EXTREMITIES:  No cords.  NEUROLOGIC:  Nonfocal.   LABORATORIES:  Abdominal ultrasound reveals a normal abdomen.  Normal  abdominal gas pattern.  Normal gallbladder ultrasound.  Chem-7 within normal  limits.  Liver function tests within normal limits.  CBC reveals a white  blood cell count of 5, hemoglobin 13.4, platelet count 225,000.   IMPRESSION:  1. A  14 week intrauterine pregnancy.  2. Acute viral gastroenteritis.    PLAN:  Admit for IV fluids, aggressive hydration, and antiemetic therapy.  Will administer diet as tolerated and continuous IV fluids this evening.  Zofran as needed.  Ambien for sleep.                                               Lenoard Aden, M.D.    RJT/MEDQ  D:  02/17/2002  T:  02/17/2002  Job:  [id number]   cc:   [name]

## 2010-12-20 LAB — POCT CARDIAC MARKERS: CKMB, poc: <1 — ABNORMAL LOW

## 2010-12-20 LAB — I-STAT 8, (EC8 V) (CONVERTED LAB)
Acid-base deficit: 1
Bicarbonate: 24
Chloride: 103
HCT: 41
Operator id: 146091
pCO2, Ven: 38.7 — ABNORMAL LOW

## 2010-12-20 LAB — CBC
Platelets: 280
RDW: 13.1

## 2010-12-20 LAB — COMPREHENSIVE METABOLIC PANEL
AST: 18
Albumin: 3.9
Chloride: 102
Creatinine, Ser: 0.71
GFR calc Af Amer: >60
Potassium: 3.7
Sodium: 135
Total Bilirubin: 0.7

## 2010-12-20 LAB — DIFFERENTIAL
Basophils Absolute: 0
Eosinophils Relative: 0
Lymphocytes Relative: 9 — ABNORMAL LOW
Neutro Abs: 6.5
Neutrophils Relative %: 81 — ABNORMAL HIGH

## 2010-12-20 LAB — POCT I-STAT CREATININE: Creatinine, Ser: 0.8

## 2014-01-19 ENCOUNTER — Telehealth: Payer: Self-pay | Admitting: Family Medicine

## 2014-01-19 NOTE — Telephone Encounter (Signed)
Yes I can see him  ?

## 2014-01-19 NOTE — Telephone Encounter (Signed)
Pt said her and husband are pt of your and there son is 2 and is asking if you will except him as a pt.

## 2014-01-20 NOTE — Telephone Encounter (Signed)
Pt has been notified Mom will c/b to schedule .Marland Kitchen

## 2015-01-01 ENCOUNTER — Telehealth: Payer: Self-pay | Admitting: Family Medicine

## 2015-01-01 NOTE — Telephone Encounter (Signed)
Pt not seen you in a long,long time. Would like to come in for a physical And labs.  Will you accept her back as a pt, and for this type of appt?

## 2015-01-01 NOTE — Telephone Encounter (Signed)
Yes I can see her, I see her husband also

## 2015-01-04 NOTE — Telephone Encounter (Signed)
Pt aware, but is driving and will call back in the am to schedule a CPE.

## 2015-01-06 NOTE — Telephone Encounter (Signed)
Pt has been schedule and she is aware md will accept her husband also

## 2015-01-18 ENCOUNTER — Other Ambulatory Visit (INDEPENDENT_AMBULATORY_CARE_PROVIDER_SITE_OTHER): Payer: 59

## 2015-01-18 DIAGNOSIS — Z0000 Encounter for general adult medical examination without abnormal findings: Secondary | ICD-10-CM | POA: Diagnosis not present

## 2015-01-18 LAB — CBC WITH DIFFERENTIAL/PLATELET
Basophils Absolute: 0.1 10*3/uL (ref 0.0–0.1)
Basophils Relative: 1 % (ref 0.0–3.0)
EOS PCT: 2.1 % (ref 0.0–5.0)
Eosinophils Absolute: 0.1 10*3/uL (ref 0.0–0.7)
HEMATOCRIT: 36.8 % (ref 36.0–46.0)
Hemoglobin: 11.9 g/dL — ABNORMAL LOW (ref 12.0–15.0)
LYMPHS ABS: 1.1 10*3/uL (ref 0.7–4.0)
Lymphocytes Relative: 22 % (ref 12.0–46.0)
MCHC: 32.4 g/dL (ref 30.0–36.0)
MCV: 79.8 fl (ref 78.0–100.0)
MONOS PCT: 11.9 % (ref 3.0–12.0)
Monocytes Absolute: 0.6 10*3/uL (ref 0.1–1.0)
Neutro Abs: 3.2 10*3/uL (ref 1.4–7.7)
Neutrophils Relative %: 63 % (ref 43.0–77.0)
PLATELETS: 323 10*3/uL (ref 150.0–400.0)
RBC: 4.61 Mil/uL (ref 3.87–5.11)
RDW: 14.7 % (ref 11.5–15.5)
WBC: 5.1 10*3/uL (ref 4.0–10.5)

## 2015-01-18 LAB — HEPATIC FUNCTION PANEL
ALK PHOS: 61 U/L (ref 39–117)
ALT: 10 U/L (ref 0–35)
AST: 11 U/L (ref 0–37)
Albumin: 4.1 g/dL (ref 3.5–5.2)
BILIRUBIN DIRECT: 0.1 mg/dL (ref 0.0–0.3)
BILIRUBIN TOTAL: 0.4 mg/dL (ref 0.2–1.2)
TOTAL PROTEIN: 7.2 g/dL (ref 6.0–8.3)

## 2015-01-18 LAB — TSH: TSH: 1.48 u[IU]/mL (ref 0.35–4.50)

## 2015-01-18 LAB — LIPID PANEL
CHOL/HDL RATIO: 3
Cholesterol: 141 mg/dL (ref 0–200)
HDL: 52 mg/dL (ref >39.00)
LDL CALC: 80 mg/dL (ref 0–99)
NONHDL: 89.17
Triglycerides: 45 mg/dL (ref 0.0–149.0)
VLDL: 9 mg/dL (ref 0.0–40.0)

## 2015-01-18 LAB — POCT URINALYSIS DIPSTICK
BILIRUBIN UA: NEGATIVE
Glucose, UA: NEGATIVE
KETONES UA: NEGATIVE
Leukocytes, UA: NEGATIVE
NITRITE UA: NEGATIVE
PH UA: 7
Protein, UA: NEGATIVE
RBC UA: NEGATIVE
SPEC GRAV UA: 1.02
Urobilinogen, UA: 0.2

## 2015-01-18 LAB — BASIC METABOLIC PANEL
BUN: 9 mg/dL (ref 6–23)
CO2: 23 meq/L (ref 19–32)
Calcium: 9.4 mg/dL (ref 8.4–10.5)
Chloride: 107 mEq/L (ref 96–112)
Creatinine, Ser: 0.79 mg/dL (ref 0.40–1.20)
GFR: 84.68 mL/min (ref >60.00)
GLUCOSE: 90 mg/dL (ref 70–99)
POTASSIUM: 3.3 meq/L — AB (ref 3.5–5.1)
SODIUM: 138 meq/L (ref 135–145)

## 2015-01-25 ENCOUNTER — Ambulatory Visit (INDEPENDENT_AMBULATORY_CARE_PROVIDER_SITE_OTHER): Payer: 59 | Admitting: Family Medicine

## 2015-01-25 ENCOUNTER — Encounter: Payer: Self-pay | Admitting: Family Medicine

## 2015-01-25 VITALS — BP 135/89 | HR 84 | Temp 98.7°F | Ht 65.5 in | Wt 215.0 lb

## 2015-01-25 DIAGNOSIS — R5382 Chronic fatigue, unspecified: Secondary | ICD-10-CM | POA: Diagnosis not present

## 2015-01-25 DIAGNOSIS — G8929 Other chronic pain: Secondary | ICD-10-CM

## 2015-01-25 DIAGNOSIS — Z0000 Encounter for general adult medical examination without abnormal findings: Secondary | ICD-10-CM

## 2015-01-25 DIAGNOSIS — M545 Low back pain: Secondary | ICD-10-CM

## 2015-01-25 MED ORDER — POTASSIUM CHLORIDE CRYS ER 20 MEQ PO TBCR: 20.0000 meq | EXTENDED_RELEASE_TABLET | Freq: Every day | ORAL | Status: DC

## 2015-01-25 NOTE — Progress Notes (Signed)
Pre visit review using our clinic review tool, if applicable. No additional management support is needed unless otherwise documented below in the visit note. 

## 2015-01-26 ENCOUNTER — Encounter: Payer: Self-pay | Admitting: Family Medicine

## 2015-01-26 DIAGNOSIS — G8929 Other chronic pain: Secondary | ICD-10-CM | POA: Insufficient documentation

## 2015-01-26 DIAGNOSIS — M545 Low back pain: Secondary | ICD-10-CM

## 2015-01-26 LAB — MAGNESIUM: Magnesium: 2.1 mg/dL (ref 1.5–2.5)

## 2015-01-26 LAB — VITAMIN B12: VITAMIN B 12: 170 pg/mL — AB (ref 211–911)

## 2015-01-26 LAB — VITAMIN D 25 HYDROXY (VIT D DEFICIENCY, FRACTURES): VITD: 15.32 ng/mL — AB (ref 30.00–100.00)

## 2015-01-26 MED ORDER — TOPIRAMATE 25 MG PO TABS: 25.0000 mg | ORAL_TABLET | Freq: Every day | ORAL | Status: DC

## 2015-01-26 NOTE — Progress Notes (Signed)
Subjective:    Patient ID: [name], female    DOB: Dec 27, 1970, 44 y.o.   MRN: [id number]  HPI 44 yr old female to re-establish with [place] and for a cpx. She was last seen here in June 2011. Her major health concern for the past 3 years has been lower back pain. She fell while on her job in 2013 and injured her lower back. 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 At first she saw Dr. Amedeo Plenty and Dr. Lanice Schwab at [facility], and then she was directed to see Dr. Arvilla Market, a [facility] doctor. She has been on multiple medications and had numerous rounds of PT with poor results. She has had epidural steroid injections and even radiofrequency ablation of lumbar nerves, but the pain persists. She has been taking Topamax for some time, and her dose is now 50 mg daily. This has not helped at all, and she feels it has caused a lot of side effects like weight gain, headaches, and sedation. She was taking Tramadol for pain but has weaned herself off of this by taking Ibuprofen instead. She is very frustrated by her lack of improvement and she has consulted with a lawyer to help her navigate the Workers Comp system so she can see a new back pain specialist. She gets very little exercise, and she admits to a poor diet.    Review of Systems  Constitutional: Positive for fatigue. Negative for fever, chills, diaphoresis, activity change, appetite change and unexpected weight change.  HENT: Negative.   Eyes: Negative.   Respiratory: Negative.   Cardiovascular: Negative.   Gastrointestinal: Negative.   Genitourinary: Negative for dysuria, urgency, frequency, hematuria, flank pain, decreased urine volume, enuresis, difficulty urinating, pelvic pain and dyspareunia.  Musculoskeletal: Positive for back pain. Negative for myalgias, joint swelling, arthralgias, gait problem, neck pain and neck stiffness.  Skin:  Negative.   Neurological: Negative.   Psychiatric/Behavioral: Negative.        Objective:   Physical Exam  Constitutional: She is oriented to person, place, and time. She appears well-developed and well-nourished. No distress.  HENT:  Head: Normocephalic and atraumatic.  Right Ear: External ear normal.  Left Ear: External ear normal.  Nose: Nose normal.  Mouth/Throat: Oropharynx is clear and moist. No oropharyngeal exudate.  Eyes: Conjunctivae and EOM are normal. Pupils are equal, round, and reactive to light. No scleral icterus.  Neck: Normal range of motion. Neck supple. No JVD present. No thyromegaly present.  Cardiovascular: Normal rate, regular rhythm, normal heart sounds and intact distal pulses.  Exam reveals no gallop and no friction rub.   No murmur heard. Pulmonary/Chest: Effort normal and breath sounds normal. No respiratory distress. She has no wheezes. She has no rales. She exhibits no tenderness.  Abdominal: Soft. Bowel sounds are normal. She exhibits no distension and no mass. There is no tenderness. There is no rebound and no guarding.  Musculoskeletal: Normal range of motion. She exhibits no edema or tenderness.  Lymphadenopathy:    She has no cervical adenopathy.  Neurological: She is alert and oriented to person, place, and time. She has normal reflexes. No cranial nerve deficit. She exhibits normal muscle tone. Coordination normal.  Skin: Skin is warm and dry. No rash noted. No erythema.  Psychiatric: She has a normal mood and affect. Her behavior is normal. Judgment and thought content normal.  Assessment & Plan:  Well exam. We discussed diet and exercise advice. I encouraged her to work with her lawyer to see a new doctor for a second opinion on her back pain. Her labs indicate a low potassium, so she is started on Klor-con 20 mEq daily. She will decrease the dose of Topamax to 25 mg daily. Recheck in one month

## 2015-02-24 ENCOUNTER — Ambulatory Visit (INDEPENDENT_AMBULATORY_CARE_PROVIDER_SITE_OTHER): Payer: 59 | Admitting: Family Medicine

## 2015-02-24 ENCOUNTER — Encounter: Payer: Self-pay | Admitting: Family Medicine

## 2015-02-24 VITALS — BP 113/68 | HR 70 | Temp 98.9°F | Ht 65.5 in | Wt 214.0 lb

## 2015-02-24 DIAGNOSIS — I10 Essential (primary) hypertension: Secondary | ICD-10-CM | POA: Diagnosis not present

## 2015-02-24 DIAGNOSIS — M545 Low back pain, unspecified: Secondary | ICD-10-CM

## 2015-02-24 DIAGNOSIS — G8929 Other chronic pain: Secondary | ICD-10-CM | POA: Diagnosis not present

## 2015-02-24 DIAGNOSIS — E876 Hypokalemia: Secondary | ICD-10-CM | POA: Diagnosis not present

## 2015-02-24 LAB — BASIC METABOLIC PANEL
BUN: 15 mg/dL (ref 6–23)
CHLORIDE: 107 meq/L (ref 96–112)
CO2: 25 mEq/L (ref 19–32)
Calcium: 9.5 mg/dL (ref 8.4–10.5)
Creatinine, Ser: 0.83 mg/dL (ref 0.40–1.20)
GFR: 79.95 mL/min (ref >60.00)
Glucose, Bld: 91 mg/dL (ref 70–99)
POTASSIUM: 4.2 meq/L (ref 3.5–5.1)
SODIUM: 140 meq/L (ref 135–145)

## 2015-02-24 NOTE — Progress Notes (Signed)
Pre visit review using our clinic review tool, if applicable. No additional management support is needed unless otherwise documented below in the visit note. 

## 2015-02-24 NOTE — Progress Notes (Signed)
   Subjective:    Patient ID: [name], female    DOB: March 22, 1970, 44 y.o.   MRN: [id number]  HPI Here to follow up on low potassium and weight issues. She has been on Klor-con for the past month and feels about the same. We ave been tapering off Topamax and she is now down to 25 mg a day. She saw Dr. Chalmers Cater this morning for an Endocrine evaluation and se drew blood samples for lots of tests. It sounds like she has some insulin resistance among other things. She has finally settled the Workers Comp case about her back injury so she can now pursue other treatments.   Review of Systems  Constitutional: Negative.   Respiratory: Negative.   Cardiovascular: Negative.   Endocrine: Negative.   Neurological: Negative.        Objective:   Physical Exam  Constitutional: She is oriented to person, place, and time. She appears well-developed and well-nourished.  Cardiovascular: Normal rate, regular rhythm, normal heart sounds and intact distal pulses.   Pulmonary/Chest: Effort normal and breath sounds normal.  Neurological: She is alert and oriented to person, place, and time.          Assessment & Plan:  We will recheck a BMET today for the potassium level. Stop the Topamax completely at this point. I asked her to have Dr. Almetta Lovely office to forward all her test results to [place].

## 2015-06-28 ENCOUNTER — Telehealth: Payer: Self-pay | Admitting: Family Medicine

## 2015-06-28 NOTE — Telephone Encounter (Signed)
Looks like FYI

## 2015-06-28 NOTE — Telephone Encounter (Signed)
FYI  - Appointment scheduled Wednesday 06/30/15 at 8:45am with Dr. Sarajane Jews

## 2015-06-28 NOTE — Telephone Encounter (Signed)
[facility] Day - Client [place] [facility] Patient Name: [name] DOB: [birth year] Initial Comment Caller states woke up with ABD pain , sweats, now she is having pain on left side and lower back. - has been having problems with BM as well Nurse Assessment Nurse: Dimas Chyle, RN, Dellis Filbert Date/Time Eilene Ghazi Time): 06/28/2015 10:56:45 AM Confirm and document reason for call. If symptomatic, describe symptoms. You must click the next button to save text entered. ---Caller states woke up with ABD pain , sweats, now she is having pain on left side and lower back. - has been having problems with BM as well. Last BM was on Friday. Has the patient traveled out of the country within the last 30 days? ---No Does the patient have any new or worsening symptoms? ---Yes Will a triage be completed? ---Yes Related visit to physician within the last 2 weeks? ---No Does the PT have any chronic conditions? (i.e. diabetes, asthma, etc.) ---No Is the patient pregnant or possibly pregnant? (Ask all females between the ages of 48-55) ---No Is this a behavioral health or substance abuse call? ---No Guidelines Guideline Title Affirmed Question Affirmed Notes Rectal Symptoms MODERATE-SEVERE rectal pain (i.e., interferes with school, work, or sleep) Final Disposition User See Physician within 24 Hours [place], RN, [employer] Referrals REFERRED TO PCP OFFICE Disagree/Comply: Leta Baptist

## 2015-06-29 ENCOUNTER — Encounter: Payer: Self-pay | Admitting: Family Medicine

## 2015-06-29 ENCOUNTER — Ambulatory Visit (INDEPENDENT_AMBULATORY_CARE_PROVIDER_SITE_OTHER): Payer: 59 | Admitting: Family Medicine

## 2015-06-29 VITALS — BP 123/87 | HR 73 | Temp 98.2°F | Ht 65.5 in | Wt 198.0 lb

## 2015-06-29 DIAGNOSIS — R109 Unspecified abdominal pain: Secondary | ICD-10-CM

## 2015-06-29 LAB — CBC WITH DIFFERENTIAL/PLATELET
BASOS PCT: 0.6 % (ref 0.0–3.0)
Basophils Absolute: 0 10*3/uL (ref 0.0–0.1)
EOS PCT: 1.9 % (ref 0.0–5.0)
Eosinophils Absolute: 0.1 10*3/uL (ref 0.0–0.7)
HCT: 35.4 % — ABNORMAL LOW (ref 36.0–46.0)
HEMOGLOBIN: 11.6 g/dL — AB (ref 12.0–15.0)
Lymphocytes Relative: 32.3 % (ref 12.0–46.0)
Lymphs Abs: 1.9 10*3/uL (ref 0.7–4.0)
MCHC: 32.8 g/dL (ref 30.0–36.0)
MCV: 79.9 fl (ref 78.0–100.0)
MONOS PCT: 7.4 % (ref 3.0–12.0)
Monocytes Absolute: 0.4 10*3/uL (ref 0.1–1.0)
Neutro Abs: 3.5 10*3/uL (ref 1.4–7.7)
Neutrophils Relative %: 57.8 % (ref 43.0–77.0)
Platelets: 368 10*3/uL (ref 150.0–400.0)
RBC: 4.43 Mil/uL (ref 3.87–5.11)
RDW: 16 % — AB (ref 11.5–15.5)
WBC: 6 10*3/uL (ref 4.0–10.5)

## 2015-06-29 LAB — BASIC METABOLIC PANEL
BUN: 13 mg/dL (ref 6–23)
CHLORIDE: 103 meq/L (ref 96–112)
CO2: 26 mEq/L (ref 19–32)
Calcium: 9.8 mg/dL (ref 8.4–10.5)
Creatinine, Ser: 0.73 mg/dL (ref 0.40–1.20)
GFR: 92.57 mL/min (ref >60.00)
GLUCOSE: 88 mg/dL (ref 70–99)
POTASSIUM: 3.6 meq/L (ref 3.5–5.1)
SODIUM: 138 meq/L (ref 135–145)

## 2015-06-29 LAB — POC URINALSYSI DIPSTICK (AUTOMATED)
BILIRUBIN UA: NEGATIVE
Blood, UA: NEGATIVE
GLUCOSE UA: NEGATIVE
KETONES UA: NEGATIVE
LEUKOCYTES UA: NEGATIVE
Nitrite, UA: NEGATIVE
Protein, UA: NEGATIVE
Urobilinogen, UA: 0.2
pH, UA: 5.5

## 2015-06-29 NOTE — Progress Notes (Signed)
Pre visit review using our clinic review tool, if applicable. No additional management support is needed unless otherwise documented below in the visit note. 

## 2015-06-29 NOTE — Progress Notes (Signed)
   Subjective:    Patient ID: [name], female    DOB: [birth year], 46 y.o.   MRN: [id number]  HPI Here for 3 days of intermittent left flank and LLQ pain. This is uncomfortable but not severe. No nausea or fever. No urinary urgency or burning. No blood is the urine or stool. BMs are normal.    Review of Systems  Constitutional: Negative.   Respiratory: Negative.   Cardiovascular: Negative.   Gastrointestinal: Positive for abdominal pain. Negative for nausea, vomiting, diarrhea, constipation, blood in stool, abdominal distention, anal bleeding and rectal pain.  Genitourinary: Positive for flank pain. Negative for dysuria, urgency, frequency, hematuria and pelvic pain.       Objective:   Physical Exam  Constitutional: She appears well-developed and well-nourished. No distress.  Cardiovascular: Normal rate, regular rhythm, normal heart sounds and intact distal pulses.   Pulmonary/Chest: Effort normal and breath sounds normal.  Abdominal: Soft. Bowel sounds are normal. She exhibits no distension and no mass. There is no rebound and no guarding.  Mildly tender in the left flank and LLQ          Assessment & Plan:  Left flank pain. Her UA today is clear. Get labs and set up a contrasted CT scan soon. Possible etiologies include diverticulitis or a kidney stone or an ovarian cyst.

## 2015-06-29 NOTE — Addendum Note (Signed)
Addended by: Aggie Hacker A on: 06/29/2015 03:09 PM   Modules accepted: Orders

## 2015-06-30 ENCOUNTER — Ambulatory Visit: Payer: Self-pay | Admitting: Family Medicine

## 2015-07-02 ENCOUNTER — Ambulatory Visit (INDEPENDENT_AMBULATORY_CARE_PROVIDER_SITE_OTHER)
Admission: RE | Admit: 2015-07-02 | Discharge: 2015-07-02 | Disposition: A | Discharge status: Home / Self Care | Payer: 59 | Source: Ambulatory Visit | Attending: Family Medicine | Admitting: Family Medicine

## 2015-07-02 DIAGNOSIS — R109 Unspecified abdominal pain: Secondary | ICD-10-CM

## 2015-07-02 MED ORDER — IOPAMIDOL (ISOVUE-300) INJECTION 61%
100.0000 mL | Freq: Once | INTRAVENOUS | Status: AC | PRN
  Administered 2015-07-02: 100 mL via INTRAVENOUS

## 2015-07-05 NOTE — Addendum Note (Signed)
Addended by: Alysia Penna A on: 07/05/2015 05:02 PM   Modules accepted: Orders

## 2015-07-06 ENCOUNTER — Encounter: Payer: Self-pay | Admitting: Gastroenterology

## 2015-09-02 ENCOUNTER — Encounter: Payer: Self-pay | Admitting: Gastroenterology

## 2015-09-02 ENCOUNTER — Ambulatory Visit (INDEPENDENT_AMBULATORY_CARE_PROVIDER_SITE_OTHER): Payer: 59 | Admitting: Gastroenterology

## 2015-09-02 VITALS — BP 118/76 | HR 76 | Ht 65.75 in | Wt 194.4 lb

## 2015-09-02 DIAGNOSIS — R109 Unspecified abdominal pain: Secondary | ICD-10-CM

## 2015-09-02 DIAGNOSIS — R634 Abnormal weight loss: Secondary | ICD-10-CM | POA: Diagnosis not present

## 2015-09-02 DIAGNOSIS — R194 Change in bowel habit: Secondary | ICD-10-CM

## 2015-09-02 DIAGNOSIS — K625 Hemorrhage of anus and rectum: Secondary | ICD-10-CM

## 2015-09-02 DIAGNOSIS — R6881 Early satiety: Secondary | ICD-10-CM

## 2015-09-02 MED ORDER — NA SULFATE-K SULFATE-MG SULF 17.5-3.13-1.6 GM/177ML PO SOLN: 1.0000 | Freq: Once | ORAL | Status: DC

## 2015-09-02 NOTE — Progress Notes (Signed)
HPI :  45 y/o female here for a new patient visit for complaints of abdominal pain and change in bowel habits  She report abdominal pain located in the left mid abdomen, lateral to the umbilicus.  Pain ongoing for the past 3 months or so. She has some baseline pain present at the site "all the time" which is mild, rated 2/10, but has episodes of severe pain at the site. Pain can flare to 10/10 and be "excrutiating" when she gets. Pain to this level is about once per week. There is no precipitant to it. Not related to eating. No associated nausea or vomiting. It can wake her from sleep, associated with sweats. Severe pain lasts about upwards of 4-5 hours at a time before abating on its own. No trauma to the area. She had a CT scan of the abdomen which did not show a clear source.  She does endorse change in bowel habits. Having a bowel movement can sometimes make her pain worse, but pain is not relieved with a BM. She thinks since January her bowels have changed. She has a BM every 3 days, she has ongoing constipation, and hard to evacuate her stools. Stool form has changed to passing thin stools / ribbons. She sees blood occasionally in the stools. No FH of colon cancer known. No prior colonoscopy. She does not think she has had an upper endoscopy. She has tried miralax in the past which she did not think helped too much when taking it daily but only tried once daily dosing.   She is not interested in food. She has lost 22 lbs over several months, not trying to do so. She is trying to eat high fiber foods. She does feel easily when she eats normal volumes. She has a history of DM, eating smaller portions more frequently which can help. She denies reflux symptoms. She denies heartburn.   She has taken NSAIDs in the past, indocin and ibuprofen, she takes an NSAID once every 3 days or so.     Past Medical History  Diagnosis Date  . Chronic lumbar pain since 2013    sees Dr. Arvilla Market   . Cardiac  arrhythmia   . Chronic headaches   . PE (pulmonary embolism)     due to surgery  . Interstitial cystitis   . Sepsis ([place])     due to surgery  . Staph infection   . Cellulitis      Past Surgical History  Procedure Laterality Date  . Radiofrequency ablation nerves      lumbar spine, per Dr. Ace Gins   . Breast reduction surgery    . Exploratory laparotomy      with left ovary and mass removed   Family History  Problem Relation Age of Onset  . Colon polyps Mother   . Colon polyps Paternal Grandmother   . Colon polyps Maternal Grandmother   . Diabetes Paternal Grandfather   . Diabetes Maternal Grandfather   . Diabetes Sister   . Heart disease Maternal Grandfather   . Heart disease Paternal Grandfather   . Heart Problems Mother   . Heart Problems Sister   . Irritable bowel syndrome Mother   . Irritable bowel syndrome Paternal Aunt    Social History  Substance Use Topics  . Smoking status: Former Smoker -- 1 years    Types: Cigarettes  . Smokeless tobacco: Never Used     Comment: age 60  . Alcohol Use: 0.0 oz/week  0 Standard drinks or equivalent per week     Comment: glass of wine    Current Outpatient Prescriptions  Medication Sig Dispense Refill  . B Complex Vitamins (VITAMIN B COMPLEX PO) Take 1 tablet by mouth daily.    . Cholecalciferol (VITAMIN D) 2000 units tablet Take 2,000 Units by mouth daily.    Marland Kitchen ibuprofen (ADVIL,MOTRIN) 200 MG tablet Take 200 mg by mouth as needed.    . Indomethacin 40 MG CAPS Take 1 capsule by mouth as needed.    . potassium chloride SA (KLOR-CON M20) 20 MEQ tablet Take 1 tablet (20 mEq total) by mouth daily. 90 tablet 3   No current facility-administered medications for this visit.   No Known Allergies   Review of Systems: All systems reviewed and negative except where noted in HPI.   Lab Results  Component Value Date   WBC 6.0 06/29/2015   HGB 11.6* 06/29/2015   HCT 35.4* 06/29/2015   MCV 79.9 06/29/2015   PLT 368.0  06/29/2015    Lab Results  Component Value Date   CREATININE 0.73 06/29/2015   BUN 13 06/29/2015   NA 138 06/29/2015   K 3.6 06/29/2015   CL 103 06/29/2015   CO2 26 06/29/2015    Lab Results  Component Value Date   ALT 10 01/18/2015   AST 11 01/18/2015   ALKPHOS 61 01/18/2015   BILITOT 0.4 01/18/2015     Physical Exam: BP 118/76 mmHg  Pulse 76  Ht 5' 5.75" (1.67 m)  Wt 194 lb 6 oz (88.168 kg)  BMI 31.61 kg/m2  LMP 08/20/2015 Constitutional: Pleasant,well-developed, female in no acute distress. HEENT: Normocephalic and atraumatic. Conjunctivae are normal. No scleral icterus. Neck supple.  Cardiovascular: Normal rate, regular rhythm.  Pulmonary/chest: Effort normal and breath sounds normal. No wheezing, rales or rhonchi. Abdominal: Soft, nondistended, left mid abdomen with mild TTP with negative [place]. Bowel sounds active throughout. There are no masses palpable. No hepatomegaly. Extremities: no edema Lymphadenopathy: No cervical adenopathy noted. Neurological: Alert and oriented to person place and time. Skin: Skin is warm and dry. No rashes noted. Psychiatric: Normal mood and affect. Behavior is normal.   ASSESSMENT AND PLAN: 45 y/o female presenting with left mid abdomen pain as outlined above, also noted to have changes in bowel habits, early satiety, and reported weight loss. Labs show mild microcytic anemia.   CT is reassuring in regards to no significant pathology noted but she has rather dramatic symptoms. She has chronic pain at all times which is mild, but then severe worsening without clear triggers. Abdominal wall pain can present like this, however given her change in bowel habits (to include rectal bleeding), early satiety, and weight loss, I offered her an upper endoscopy and colonoscopy to rule out pathology which could cause her symptoms. After discussing the risks / benefits of endoscopy with her she wished to proceed. In the interim, recommend she take  higher dose miralax, double dose daily to BID and see if this helps constipation. Recommend she otherwise avoid NSAID use in light of her symptoms. Further recommendations pending endoscopy. She agreed.   Donnellson Cellar, MD [facility] Pager [phone]  CC: Laurey Morale, MD

## 2015-09-02 NOTE — Patient Instructions (Signed)
You have been scheduled for an endoscopy and colonoscopy. Please follow the written instructions given to you at your visit today. Please pick up your prep supplies at the pharmacy within the next 1-3 days. If you use inhalers (even only as needed), please bring them with you on the day of your procedure. Your physician has requested that you go to www.startemmi.com and enter the access code given to you at your visit today. This web site gives a general overview about your procedure. However, you should still follow specific instructions given to you by our office regarding your preparation for the procedure.  Increase Miralax

## 2015-09-27 ENCOUNTER — Telehealth: Payer: Self-pay | Admitting: Family Medicine

## 2015-09-27 NOTE — Telephone Encounter (Signed)
Yes I can see him thanks  

## 2015-09-27 NOTE — Telephone Encounter (Signed)
Called and let mom know.  

## 2015-09-27 NOTE — Telephone Encounter (Signed)
Pt would like to know if you would accept her son Labrea Warsaw age 43?  Mom state that the whole family is pts of yours.

## 2015-09-28 ENCOUNTER — Encounter: Payer: Self-pay | Admitting: Gastroenterology

## 2015-10-12 ENCOUNTER — Encounter: Payer: Self-pay | Admitting: Gastroenterology

## 2015-10-12 ENCOUNTER — Ambulatory Visit (AMBULATORY_SURGERY_CENTER): Payer: 59 | Admitting: Gastroenterology

## 2015-10-12 VITALS — BP 117/69 | HR 52 | Temp 98.6°F | Resp 15 | Ht 65.75 in | Wt 194.0 lb

## 2015-10-12 DIAGNOSIS — D122 Benign neoplasm of ascending colon: Secondary | ICD-10-CM | POA: Diagnosis not present

## 2015-10-12 DIAGNOSIS — K2970 Gastritis, unspecified, without bleeding: Secondary | ICD-10-CM

## 2015-10-12 DIAGNOSIS — D125 Benign neoplasm of sigmoid colon: Secondary | ICD-10-CM | POA: Diagnosis not present

## 2015-10-12 DIAGNOSIS — R194 Change in bowel habit: Secondary | ICD-10-CM | POA: Diagnosis not present

## 2015-10-12 DIAGNOSIS — D120 Benign neoplasm of cecum: Secondary | ICD-10-CM | POA: Diagnosis not present

## 2015-10-12 DIAGNOSIS — R109 Unspecified abdominal pain: Secondary | ICD-10-CM | POA: Diagnosis present

## 2015-10-12 DIAGNOSIS — D123 Benign neoplasm of transverse colon: Secondary | ICD-10-CM

## 2015-10-12 DIAGNOSIS — D128 Benign neoplasm of rectum: Secondary | ICD-10-CM

## 2015-10-12 DIAGNOSIS — D127 Benign neoplasm of rectosigmoid junction: Secondary | ICD-10-CM | POA: Diagnosis not present

## 2015-10-12 MED ORDER — OMEPRAZOLE 40 MG PO CPDR: 40.0000 mg | DELAYED_RELEASE_CAPSULE | Freq: Every day | ORAL | 3 refills | Status: DC

## 2015-10-12 MED ORDER — SODIUM CHLORIDE 0.9 % IV SOLN: 500.0000 mL | INTRAVENOUS | Status: DC

## 2015-10-12 MED ORDER — LINACLOTIDE 145 MCG PO CAPS: ORAL_CAPSULE | ORAL | 0 refills | Status: DC

## 2015-10-12 NOTE — Patient Instructions (Signed)
Discharge instructions given. Handouts on polyps and Gastritis. No aspirin,ibuprofen,naproxen, or other non-steroidal anti-inflammatory drugs. Resume previous medications. Prescription sent into pharmacy. YOU HAD AN ENDOSCOPIC PROCEDURE TODAY AT [facility]:   Refer to the procedure report that was given to you for any specific questions about what was found during the examination.  If the procedure report does not answer your questions, please call your gastroenterologist to clarify.  If you requested that your care partner not be given the details of your procedure findings, then the procedure report has been included in a sealed envelope for you to review at your convenience later.  YOU SHOULD EXPECT: Some feelings of bloating in the abdomen. Passage of more gas than usual.  Walking can help get rid of the air that was put into your GI tract during the procedure and reduce the bloating. If you had a lower endoscopy (such as a colonoscopy or flexible sigmoidoscopy) you may notice spotting of blood in your stool or on the toilet paper. If you underwent a bowel prep for your procedure, you may not have a normal bowel movement for a few days.  Please Note:  You might notice some irritation and congestion in your nose or some drainage.  This is from the oxygen used during your procedure.  There is no need for concern and it should clear up in a day or so.  SYMPTOMS TO REPORT IMMEDIATELY:   Following lower endoscopy (colonoscopy or flexible sigmoidoscopy):  Excessive amounts of blood in the stool  Significant tenderness or worsening of abdominal pains  Swelling of the abdomen that is new, acute  Fever of 100F or higher   Following upper endoscopy (EGD)  Vomiting of blood or coffee ground material  New chest pain or pain under the shoulder blades  Painful or persistently difficult swallowing  New shortness of breath  Fever of 100F or higher  Black, tarry-looking  stools  For urgent or emergent issues, a gastroenterologist can be reached at any hour by calling [phone].   DIET: Your first meal following the procedure should be a small meal and then it is ok to progress to your normal diet. Heavy or fried foods are harder to digest and may make you feel nauseous or bloated.  Likewise, meals heavy in dairy and vegetables can increase bloating.  Drink plenty of fluids but you should avoid alcoholic beverages for 24 hours.  ACTIVITY:  You should plan to take it easy for the rest of today and you should NOT DRIVE or use heavy machinery until tomorrow (because of the sedation medicines used during the test).    FOLLOW UP: Our staff will call the number listed on your records the next business day following your procedure to check on you and address any questions or concerns that you may have regarding the information given to you following your procedure. If we do not reach you, we will leave a message.  However, if you are feeling well and you are not experiencing any problems, there is no need to return our call.  We will assume that you have returned to your regular daily activities without incident.  If any biopsies were taken you will be contacted by phone or by letter within the next 1-3 weeks.  Please call us at [phone] if you have not heard about the biopsies in 3 weeks.    SIGNATURES/CONFIDENTIALITY: You and/or your care partner have signed paperwork which will be entered into your electronic  medical record.  These signatures attest to the fact that that the information above on your After Visit Summary has been reviewed and is understood.  Full responsibility of the confidentiality of this discharge information lies with you and/or your care-partner.  

## 2015-10-12 NOTE — Op Note (Signed)
[place] Patient Name: [name] Procedure Date: 10/12/2015 2:05 PM MRN: [id number] Endoscopist: Remo Lipps P. Havery Moros , MD Age: 45 Referring MD:  Date of Birth: 16-Mar-1970 Gender: Female Account #: [id number] Procedure:                Upper GI endoscopy Indications:              Abdominal pain Medicines:                Monitored Anesthesia Care Procedure:                Pre-Anesthesia Assessment:                           - Prior to the procedure, a History and Physical                            was performed, and patient medications and                            allergies were reviewed. The patient's tolerance of                            previous anesthesia was also reviewed. The risks                            and benefits of the procedure and the sedation                            options and risks were discussed with the patient.                            All questions were answered, and informed consent                            was obtained. Prior Anticoagulants: The patient has                            taken no previous anticoagulant or antiplatelet                            agents. ASA Grade Assessment: II - A patient with                            mild systemic disease. After reviewing the risks                            and benefits, the patient was deemed in                            satisfactory condition to undergo the procedure.                           After obtaining informed consent, the endoscope was  passed under direct vision. Throughout the                            procedure, the patient's blood pressure, pulse, and                            oxygen saturations were monitored continuously. The                            Model GIF-HQ190 [id number]) scope was introduced                            through the mouth, and advanced to the second part                            of duodenum. The upper GI  endoscopy was                            accomplished without difficulty. The patient                            tolerated the procedure well. Scope In: Scope Out: Findings:                 Esophagogastric landmarks were identified: the                            Z-line was found at 38 cm, the gastroesophageal                            junction was found at 38 cm and the upper extent of                            the gastric folds was found at 38 cm from the                            incisors.                           The exam of the esophagus was otherwise normal.                           Patchy moderate inflammation characterized by                            erythema was found in the gastric antrum. Biopsies                            were taken with a cold forceps for Helicobacter                            pylori testing.                           The exam of the stomach was  otherwise normal.                           The duodenal bulb and second portion of the                            duodenum were normal. Complications:            No immediate complications. Estimated blood loss:                            Minimal. Estimated Blood Loss:     Estimated blood loss was minimal. Impression:               - Esophagogastric landmarks identified.                           - Gastritis. Biopsied.                           - Normal duodenal bulb and second portion of the                            duodenum. Recommendation:           - Patient has a contact number available for                            emergencies. The signs and symptoms of potential                            delayed complications were discussed with the                            patient. Return to normal activities tomorrow.                            Written discharge instructions were provided to the                            patient.                           - Resume previous diet.                            - Continue present medications.                           - No aspirin, ibuprofen, naproxen, or other                            non-steroidal anti-inflammatory drugs.                           - Trial of omeprazole 40mg  once daily                           -  Await pathology results.                           - No repeat upper endoscopy. Remo Lipps P. Armbruster, MD 10/12/2015 3:11:23 PM This report has been signed electronically.

## 2015-10-12 NOTE — Progress Notes (Signed)
To recovery, report to McCoy, RN, VSS 

## 2015-10-12 NOTE — Progress Notes (Signed)
Called to room to assist during endoscopic procedure.  Patient ID and intended procedure confirmed with present staff. Received instructions for my participation in the procedure from the performing physician.  

## 2015-10-12 NOTE — Op Note (Signed)
[place] Patient Name: [name] Procedure Date: 10/12/2015 2:06 PM MRN: [id number] Endoscopist: Remo Lipps P. Havery Moros , MD Age: 45 Referring MD:  Date of Birth: 1970/04/03 Gender: Female Account #: [id number] Procedure:                Colonoscopy Indications:              Evaluation of unexplained GI bleeding, Abdominal                            pain, Change in bowel habits Medicines:                Monitored Anesthesia Care Procedure:                Pre-Anesthesia Assessment:                           - Prior to the procedure, a History and Physical                            was performed, and patient medications and                            allergies were reviewed. The patient's tolerance of                            previous anesthesia was also reviewed. The risks                            and benefits of the procedure and the sedation                            options and risks were discussed with the patient.                            All questions were answered, and informed consent                            was obtained. Prior Anticoagulants: The patient has                            taken no previous anticoagulant or antiplatelet                            agents. ASA Grade Assessment: II - A patient with                            mild systemic disease. After reviewing the risks                            and benefits, the patient was deemed in                            satisfactory condition to undergo the procedure.  After obtaining informed consent, the colonoscope                            was passed under direct vision. Throughout the                            procedure, the patient's blood pressure, pulse, and                            oxygen saturations were monitored continuously. The                            Model CF-HQ190L [id number]) scope was introduced                            through the anus and  advanced to the the terminal                            ileum, with identification of the appendiceal                            orifice and IC valve. The colonoscopy was performed                            without difficulty. The patient tolerated the                            procedure well. The quality of the bowel                            preparation was adequate. The terminal ileum,                            ileocecal valve, appendiceal orifice, and rectum                            were photographed. Scope In: 2:20:56 PM Scope Out: 2:53:31 PM Scope Withdrawal Time: 0 hours 20 minutes 42 seconds  Total Procedure Duration: 0 hours 32 minutes 35 seconds  Findings:                 The perianal and digital rectal examinations were                            normal.                           A 4 mm polyp was found in the cecum. The polyp was                            sessile. The polyp was removed with a cold snare.                            Resection and retrieval were complete.  A 5 mm polyp was found in the ascending colon. The                            polyp was sessile. The polyp was removed with a                            cold snare. Resection and retrieval were complete.                           Two sessile polyps were found in the transverse                            colon. The polyps were 8 to 10 mm in size. These                            polyps were removed with a cold snare. Resection                            and retrieval were complete.                           A 7 mm polyp was found in the sigmoid colon. The                            polyp was pedunculated. The polyp was removed with                            a hot snare. Resection and retrieval were complete.                           A 6 mm polyp was found in the recto-sigmoid colon.                            The polyp was pedunculated. The polyp was removed                             with a hot snare. Resection and retrieval were                            complete.                           A 6 mm polyp was found in the rectum. The polyp was                            pedunculated. The polyp was removed with a hot                            snare. Resection and retrieval were complete.                           The terminal ileum appeared normal.  The exam was otherwise without abnormality on                            direct and retroflexion views. Complications:            No immediate complications. Estimated blood loss:                            Minimal. Estimated Blood Loss:     Estimated blood loss was minimal. Impression:               - One 4 mm polyp in the cecum, removed with a cold                            snare. Resected and retrieved.                           - One 5 mm polyp in the ascending colon, removed                            with a cold snare. Resected and retrieved.                           - Two 8 to 10 mm polyps in the transverse colon,                            removed with a cold snare. Resected and retrieved.                           - One 7 mm polyp in the sigmoid colon, removed with                            a hot snare. Resected and retrieved.                           - One 6 mm polyp at the recto-sigmoid colon,                            removed with a hot snare. Resected and retrieved.                           - One 6 mm polyp in the rectum, removed with a hot                            snare. Resected and retrieved.                           - The examined portion of the ileum was normal.                           - The examination was otherwise normal on direct  and retroflexion views. Recommendation:           - Patient has a contact number available for                            emergencies. The signs and symptoms of potential                            delayed  complications were discussed with the                            patient. Return to normal activities tomorrow.                            Written discharge instructions were provided to the                            patient.                           - Resume previous diet.                           - Continue present medications.                           - No aspirin, ibuprofen, naproxen, or other                            non-steroidal anti-inflammatory drugs for 2 weeks                            after polyp removal.                           - Await pathology results.                           - Repeat colonoscopy is recommended for                            surveillance. The colonoscopy date will be                            determined after pathology results from today's                            exam become available for review. Remo Lipps P. Armbruster, MD 10/12/2015 3:07:41 PM This report has been signed electronically.

## 2015-10-13 ENCOUNTER — Telehealth: Payer: Self-pay | Admitting: *Deleted

## 2015-10-13 NOTE — Telephone Encounter (Signed)
Message left

## 2015-10-19 ENCOUNTER — Encounter: Payer: Self-pay | Admitting: Gastroenterology

## 2015-10-22 ENCOUNTER — Telehealth: Payer: Self-pay | Admitting: Gastroenterology

## 2015-10-25 NOTE — Telephone Encounter (Signed)
Prior authorization request was given by phone to [employer]. PA was sent for clinical review. We are awaiting response.

## 2015-10-27 ENCOUNTER — Telehealth: Payer: Self-pay | Admitting: *Deleted

## 2015-10-27 NOTE — Telephone Encounter (Signed)
[employer] (phone [phone]) has denied patient's Linzess 145 mcg, 1-2 tablets twice daily. Plan allows for only 1 capsule daily. (PA# [id number]). Would you like for me to try for Linzess 145 mcg one capsule daily, Linzess 290 1 capsule daily (I could have her open capsule and take 1/2 if the 290 is too much) or something else?

## 2015-10-28 MED ORDER — LINACLOTIDE 145 MCG PO CAPS: 145.0000 ug | ORAL_CAPSULE | Freq: Every day | ORAL | 3 refills | Status: DC

## 2015-10-28 NOTE — Telephone Encounter (Signed)
Left voicemail for patient to call back. 

## 2015-10-28 NOTE — Addendum Note (Signed)
Addended by: Larina Bras on: 10/28/2015 08:52 AM   Modules accepted: Orders

## 2015-10-28 NOTE — Telephone Encounter (Signed)
I would order the linzess 151mcg daily and see how this goes, we can order the higher dose if needed moving forward. Thanks

## 2015-10-29 NOTE — Telephone Encounter (Signed)
Left a message for patient to return my call. 

## 2015-11-01 NOTE — Telephone Encounter (Signed)
I have left another voicemail for patient to call back. We will await a call from patient before proceeding.

## 2015-12-03 ENCOUNTER — Ambulatory Visit (INDEPENDENT_AMBULATORY_CARE_PROVIDER_SITE_OTHER): Payer: 59 | Admitting: Family Medicine

## 2015-12-03 ENCOUNTER — Encounter: Payer: Self-pay | Admitting: Family Medicine

## 2015-12-03 VITALS — BP 122/82 | HR 59 | Temp 98.1°F | Ht 65.75 in | Wt 193.0 lb

## 2015-12-03 DIAGNOSIS — M609 Myositis, unspecified: Secondary | ICD-10-CM | POA: Diagnosis not present

## 2015-12-03 DIAGNOSIS — M791 Myalgia: Secondary | ICD-10-CM

## 2015-12-03 DIAGNOSIS — IMO0001 Reserved for inherently not codable concepts without codable children: Secondary | ICD-10-CM

## 2015-12-03 NOTE — Progress Notes (Signed)
Pre visit review using our clinic review tool, if applicable. No additional management support is needed unless otherwise documented below in the visit note. 

## 2015-12-03 NOTE — Progress Notes (Signed)
   Subjective:    Patient ID: [name], female    DOB: 10/05/1970, 45 y.o.   MRN: [id number]  HPI Here for several issues. First she has had daily stiffness and aching pains all over her body for the past few months. She wakes up this way in the mornings and then it loosens up a bit the first hours. Then she starts to get worse again as the day goes on. No joint swelling. She feels like both her joints and her muscles are involved. No weakness of the muscles. No fevers or rashes. She takes a lot of Aleve or Ibuprofen for these pains, even though she was advised by Dr. Havery Moros to avoid any NSAIDs due to her gastritis.    Review of Systems  Constitutional: Negative.   Respiratory: Negative.   Cardiovascular: Negative.   Musculoskeletal: Positive for arthralgias, back pain, myalgias, neck pain and neck stiffness. Negative for gait problem and joint swelling.  Skin: Negative.        Objective:   Physical Exam  Constitutional: She is oriented to person, place, and time. She appears well-developed and well-nourished.  Neck: No thyromegaly present.  Cardiovascular: Normal rate, regular rhythm, normal heart sounds and intact distal pulses.   Pulmonary/Chest: Effort normal and breath sounds normal.  Musculoskeletal: Normal range of motion. She exhibits no edema, tenderness or deformity.  Lymphadenopathy:    She has no cervical adenopathy.  Neurological: She is alert and oriented to person, place, and time.  Skin: No rash noted. No erythema.          Assessment & Plan:  She has widespread pain in the body and it is difficult to tell if this is some type of inflammatory arthritis or it she may have fibromyalgia. I advised her to avoid NSAIDs as much as possible, use Tylenol prn. We will refer her to Rheumatology to evaluate further.  Laurey Morale, MD

## 2017-01-31 ENCOUNTER — Encounter: Payer: 59 | Admitting: Family Medicine

## 2017-02-15 ENCOUNTER — Ambulatory Visit (INDEPENDENT_AMBULATORY_CARE_PROVIDER_SITE_OTHER): Payer: 59 | Admitting: Family Medicine

## 2017-02-15 ENCOUNTER — Encounter: Payer: Self-pay | Admitting: Family Medicine

## 2017-02-15 VITALS — BP 98/70 | HR 76 | Temp 98.0°F | Ht 65.5 in | Wt 159.6 lb

## 2017-02-15 DIAGNOSIS — Z0000 Encounter for general adult medical examination without abnormal findings: Secondary | ICD-10-CM | POA: Diagnosis not present

## 2017-02-15 DIAGNOSIS — R252 Cramp and spasm: Secondary | ICD-10-CM

## 2017-02-15 DIAGNOSIS — D649 Anemia, unspecified: Secondary | ICD-10-CM

## 2017-02-15 LAB — POC URINALSYSI DIPSTICK (AUTOMATED)
Bilirubin, UA: NEGATIVE
Glucose, UA: NEGATIVE
KETONES UA: NEGATIVE
Leukocytes, UA: NEGATIVE
Nitrite, UA: NEGATIVE
PROTEIN UA: NEGATIVE
RBC UA: NEGATIVE
SPEC GRAV UA: 1.015 (ref 1.010–1.025)
Urobilinogen, UA: 0.2 E.U./dL
pH, UA: 6 (ref 5.0–8.0)

## 2017-02-15 LAB — BASIC METABOLIC PANEL
BUN: 11 mg/dL (ref 6–23)
CHLORIDE: 105 meq/L (ref 96–112)
CO2: 27 meq/L (ref 19–32)
CREATININE: 0.64 mg/dL (ref 0.40–1.20)
Calcium: 9.4 mg/dL (ref 8.4–10.5)
GFR: 106.93 mL/min (ref >60.00)
GLUCOSE: 85 mg/dL (ref 70–99)
Potassium: 4.3 mEq/L (ref 3.5–5.1)
Sodium: 138 mEq/L (ref 135–145)

## 2017-02-15 LAB — HEPATIC FUNCTION PANEL
ALBUMIN: 4.2 g/dL (ref 3.5–5.2)
ALT: 12 U/L (ref 0–35)
AST: 10 U/L (ref 0–37)
Alkaline Phosphatase: 41 U/L (ref 39–117)
Bilirubin, Direct: 0.1 mg/dL (ref 0.0–0.3)
TOTAL PROTEIN: 6.8 g/dL (ref 6.0–8.3)
Total Bilirubin: 0.4 mg/dL (ref 0.2–1.2)

## 2017-02-15 LAB — CBC WITH DIFFERENTIAL/PLATELET
Basophils Absolute: 0.1 10*3/uL (ref 0.0–0.1)
Basophils Relative: 2.1 % (ref 0.0–3.0)
EOS PCT: 2.1 % (ref 0.0–5.0)
Eosinophils Absolute: 0.1 10*3/uL (ref 0.0–0.7)
HCT: 29.4 % — ABNORMAL LOW (ref 36.0–46.0)
Lymphocytes Relative: 30.3 % (ref 12.0–46.0)
Lymphs Abs: 1.3 10*3/uL (ref 0.7–4.0)
MCHC: 29.7 g/dL — AB (ref 30.0–36.0)
MCV: 66 fl — AB (ref 78.0–100.0)
MONOS PCT: 7.4 % (ref 3.0–12.0)
Monocytes Absolute: 0.3 10*3/uL (ref 0.1–1.0)
NEUTROS PCT: 58.1 % (ref 43.0–77.0)
Neutro Abs: 2.6 10*3/uL (ref 1.4–7.7)
Platelets: 462 10*3/uL — ABNORMAL HIGH (ref 150.0–400.0)
RBC: 4.46 Mil/uL (ref 3.87–5.11)
RDW: 18.9 % — ABNORMAL HIGH (ref 11.5–15.5)
WBC: 4.4 10*3/uL (ref 4.0–10.5)

## 2017-02-15 LAB — LIPID PANEL
Cholesterol: 183 mg/dL (ref 0–200)
HDL: 71.2 mg/dL (ref >39.00)
LDL Cholesterol: 100 mg/dL — ABNORMAL HIGH (ref 0–99)
NonHDL: 111.81
TRIGLYCERIDES: 60 mg/dL (ref 0.0–149.0)
Total CHOL/HDL Ratio: 3
VLDL: 12 mg/dL (ref 0.0–40.0)

## 2017-02-15 LAB — TSH: TSH: 1.49 u[IU]/mL (ref 0.35–4.50)

## 2017-02-15 LAB — MAGNESIUM: MAGNESIUM: 1.8 mg/dL (ref 1.5–2.5)

## 2017-02-15 NOTE — Progress Notes (Signed)
   Subjective:    Patient ID: [name], female    DOB: 04-30-1970, 46 y.o.   MRN: [id number]  HPI Here for a well exam. She feels well in general but she does get muscle cramps at times. She and her husband have been on a low carb, no sugar diet and she has lost a good deal of weight.    Review of Systems  Constitutional: Negative.   HENT: Negative.   Eyes: Negative.   Respiratory: Negative.   Cardiovascular: Negative.   Gastrointestinal: Negative.   Genitourinary: Negative for decreased urine volume, difficulty urinating, dyspareunia, dysuria, enuresis, flank pain, frequency, hematuria, pelvic pain and urgency.  Musculoskeletal: Positive for myalgias. Negative for arthralgias, back pain, gait problem, joint swelling, neck pain and neck stiffness.  Skin: Negative.   Neurological: Negative.   Psychiatric/Behavioral: Negative.        Objective:   Physical Exam  Constitutional: She is oriented to person, place, and time. She appears well-developed and well-nourished. No distress.  HENT:  Head: Normocephalic and atraumatic.  Right Ear: External ear normal.  Left Ear: External ear normal.  Nose: Nose normal.  Mouth/Throat: Oropharynx is clear and moist. No oropharyngeal exudate.  Eyes: Conjunctivae and EOM are normal. Pupils are equal, round, and reactive to light. No scleral icterus.  Neck: Normal range of motion. Neck supple. No JVD present. No thyromegaly present.  Cardiovascular: Normal rate, regular rhythm, normal heart sounds and intact distal pulses. Exam reveals no gallop and no friction rub.  No murmur heard. Pulmonary/Chest: Effort normal and breath sounds normal. No respiratory distress. She has no wheezes. She has no rales. She exhibits no tenderness.  Abdominal: Soft. Bowel sounds are normal. She exhibits no distension and no mass. There is no tenderness. There is no rebound and no guarding.  Musculoskeletal: Normal range of motion. She exhibits no edema or  tenderness.  Lymphadenopathy:    She has no cervical adenopathy.  Neurological: She is alert and oriented to person, place, and time. She has normal reflexes. No cranial nerve deficit. She exhibits normal muscle tone. Coordination normal.  Skin: Skin is warm and dry. No rash noted. No erythema.  Psychiatric: She has a normal mood and affect. Her behavior is normal. Judgment and thought content normal.          Assessment & Plan:  Well exam. We discussed diet and exercise. Get fasting labs. For the cramps I suggested she take 2-3 OTC magnesium tablets a day.  Alysia Penna, MD

## 2017-02-19 NOTE — Addendum Note (Signed)
Addended by: Alysia Penna A on: 02/19/2017 01:01 PM   Modules accepted: Orders

## 2017-02-23 ENCOUNTER — Other Ambulatory Visit: Payer: Self-pay

## 2017-02-23 MED ORDER — FERROUS SULFATE 325 (65 FE) MG PO TABS: 325.0000 mg | ORAL_TABLET | Freq: Two times a day (BID) | ORAL | 5 refills | Status: DC

## 2017-02-23 NOTE — Telephone Encounter (Signed)
Please send in Rx for Ferrous sulfate 325 mg to take bid, #[phone] with 5 rf per Dr. Sarajane Jews. Rx sent pt advised.

## 2017-04-02 ENCOUNTER — Other Ambulatory Visit: Payer: 59

## 2017-04-04 ENCOUNTER — Other Ambulatory Visit (INDEPENDENT_AMBULATORY_CARE_PROVIDER_SITE_OTHER): Payer: 59

## 2017-04-04 DIAGNOSIS — D649 Anemia, unspecified: Secondary | ICD-10-CM

## 2017-04-04 LAB — VITAMIN B12: Vitamin B-12: 232 pg/mL (ref 211–911)

## 2017-04-04 LAB — IRON: IRON: 20 ug/dL — AB (ref 42–145)

## 2017-04-04 LAB — FERRITIN: Ferritin: 1.4 ng/mL — ABNORMAL LOW (ref 10.0–291.0)

## 2017-04-04 LAB — FOLATE: Folate: 13.6 ng/mL (ref >5.9)

## 2017-04-09 DIAGNOSIS — D3132 Benign neoplasm of left choroid: Secondary | ICD-10-CM | POA: Diagnosis not present

## 2017-04-18 DIAGNOSIS — M9901 Segmental and somatic dysfunction of cervical region: Secondary | ICD-10-CM | POA: Diagnosis not present

## 2017-04-18 DIAGNOSIS — M5413 Radiculopathy, cervicothoracic region: Secondary | ICD-10-CM | POA: Diagnosis not present

## 2017-04-18 DIAGNOSIS — M62830 Muscle spasm of back: Secondary | ICD-10-CM | POA: Diagnosis not present

## 2018-01-21 ENCOUNTER — Encounter: Payer: Self-pay | Admitting: Obstetrics & Gynecology

## 2018-01-24 ENCOUNTER — Encounter: Payer: Self-pay | Admitting: Obstetrics & Gynecology

## 2018-01-24 ENCOUNTER — Ambulatory Visit (INDEPENDENT_AMBULATORY_CARE_PROVIDER_SITE_OTHER): Payer: 59 | Admitting: Obstetrics & Gynecology

## 2018-01-24 VITALS — BP 128/80 | Ht 65.0 in | Wt 162.0 lb

## 2018-01-24 DIAGNOSIS — N920 Excessive and frequent menstruation with regular cycle: Secondary | ICD-10-CM

## 2018-01-24 DIAGNOSIS — Z9851 Tubal ligation status: Secondary | ICD-10-CM

## 2018-01-24 DIAGNOSIS — Z01419 Encounter for gynecological examination (general) (routine) without abnormal findings: Secondary | ICD-10-CM

## 2018-01-24 DIAGNOSIS — D219 Benign neoplasm of connective and other soft tissue, unspecified: Secondary | ICD-10-CM | POA: Diagnosis not present

## 2018-01-24 DIAGNOSIS — I499 Cardiac arrhythmia, unspecified: Secondary | ICD-10-CM

## 2018-01-24 DIAGNOSIS — Z86711 Personal history of pulmonary embolism: Secondary | ICD-10-CM

## 2018-01-24 NOTE — Progress Notes (Signed)
[name] 03-21-1970 [id number]   History:    47 y.o. [id number] Married. S/P Tubal Ligation  RP:  Established patient presenting for annual gyn exam   HPI: Longstanding very heavy periods.  History of uterine fibroids.  No breakthrough bleeding.  No pelvic pain.  Secondary anemia.  H/O PE, contraindication to hormonal management of menorrhagia.  Patient now requesting hysterectomy.  No pain with intercourse.  Urine and bowel movements within normal currently.  History of interstitial cystitis.  Breast normal.  Body mass index 26.96.  Complains of frequent heart palpitations.  Especially at nighttime.  Not increased by physical activity and no chest pain.  No regular physical activity currently.  Will follow up here for fasting health labs.  Past medical history,surgical history, family history and social history were all reviewed and documented in the EPIC chart.  Gynecologic History Patient's last menstrual period was 01/03/2018. Contraception: Tubal Ligation Last Pap: 2017. Results were: Normal Last mammogram: 2017. Results were: Normal Bone Density: Never Colonoscopy: 3 yrs ago  Obstetric History OB History  Gravida Para Term Preterm AB Living  4 3     1 3   SAB TAB Ectopic Multiple Live Births               # Outcome Date GA Lbr Len/2nd Weight Sex Delivery Anes PTL Lv  4 AB           3 Para           2 Para           1 Para              ROS: A ROS was performed and pertinent positives and negatives are included in the history.  GENERAL: No fevers or chills. HEENT: No change in vision, no earache, sore throat or sinus congestion. NECK: No pain or stiffness. CARDIOVASCULAR: No chest pain or pressure. No palpitations. PULMONARY: No shortness of breath, cough or wheeze. GASTROINTESTINAL: No abdominal pain, nausea, vomiting or diarrhea, melena or bright red blood per rectum. GENITOURINARY: No urinary frequency, urgency, hesitancy or dysuria. MUSCULOSKELETAL: No joint or  muscle pain, no back pain, no recent trauma. DERMATOLOGIC: No rash, no itching, no lesions. ENDOCRINE: No polyuria, polydipsia, no heat or cold intolerance. No recent change in weight. HEMATOLOGICAL: No anemia or easy bruising or bleeding. NEUROLOGIC: No headache, seizures, numbness, tingling or weakness. PSYCHIATRIC: No depression, no loss of interest in normal activity or change in sleep pattern.     Exam:   BP 128/80   Ht 5' 5"  (1.651 m)   Wt 162 lb (73.5 kg)   LMP 01/03/2018   BMI 26.96 kg/m   Body mass index is 26.96 kg/m.  General appearance : Well developed well nourished female. No acute distress HEENT: Eyes: no retinal hemorrhage or exudates,  Neck supple, trachea midline, no carotid bruits, no thyroidmegaly Lungs: Clear to auscultation, no rhonchi or wheezes, or rib retractions  Heart: Regular rate and rhythm, no murmurs or gallops Breast:Examined in sitting and supine position were symmetrical in appearance, no palpable masses or tenderness,  no skin retraction, no nipple inversion, no nipple discharge, no skin discoloration, no axillary or supraclavicular lymphadenopathy Abdomen: no palpable masses or tenderness, no rebound or guarding Extremities: no edema or skin discoloration or tenderness  Pelvic: Vulva: Normal             Vagina: No gross lesions or discharge  Cervix: No gross lesions or discharge.  Pap reflex done  Uterus  AV, mild increase in size, nodular, non-tender and mobile  Adnexa  Without masses or tenderness  Anus:    Assessment/Plan:  47 y.o. female for annual exam   1. Encounter for routine gynecological examination with Papanicolaou smear of cervix Gynecologic exam with mild increase in uterine side secondary to fibroids.  Pap reflex done.  Breast exam normal.  Will schedule screening mammogram now.  Follow-up for fasting health labs.  Body mass index 26.96. - CBC; Future - Comp Met (CMET); Future - TSH; Future - Lipid panel; Future - VITAMIN D 25  Hydroxy (Vit-D Deficiency, Fractures); Future  2. S/P tubal ligation  3. Menorrhagia with regular cycle Menorrhagia for a long time probably secondary to uterine fibroids.  Given her history of pulmonary embolism.  Limited in terms of medical treatment, hormonal treatment contraindicated.  Will proceed with Total Hysterectomy/Bilateral Salpingectomy, LAVH vs Robotic TLH. - US Transvaginal Non-OB; Future  4. Fibroids Follow-up to evaluate by pelvic ultrasound. - US Transvaginal Non-OB; Future  5. Hx pulmonary embolism Not on anticoagulation currently.  6. Irregular heart rate Irregular heart rate especially at night without associated chest pain.  Decision to refer to Cardio, especially in the context of history of pulmonary embolism, probable anemia and the plan for a hysterectomy.Princess Bruins MD, 4:43 PM 01/24/2018

## 2018-01-28 ENCOUNTER — Other Ambulatory Visit: Payer: 59

## 2018-01-28 DIAGNOSIS — Z01419 Encounter for gynecological examination (general) (routine) without abnormal findings: Secondary | ICD-10-CM

## 2018-01-28 LAB — PAP IG W/ RFLX HPV ASCU

## 2018-01-29 ENCOUNTER — Other Ambulatory Visit: Payer: Self-pay | Admitting: Obstetrics & Gynecology

## 2018-01-29 DIAGNOSIS — Z1231 Encounter for screening mammogram for malignant neoplasm of breast: Secondary | ICD-10-CM

## 2018-01-30 ENCOUNTER — Encounter: Payer: Self-pay | Admitting: Obstetrics & Gynecology

## 2018-01-30 ENCOUNTER — Other Ambulatory Visit: Payer: Self-pay | Admitting: Obstetrics & Gynecology

## 2018-01-30 DIAGNOSIS — E559 Vitamin D deficiency, unspecified: Secondary | ICD-10-CM

## 2018-01-30 LAB — VITAMIN D 25 HYDROXY (VIT D DEFICIENCY, FRACTURES): Vit D, 25-Hydroxy: 17 ng/mL — ABNORMAL LOW (ref 30–100)

## 2018-01-30 LAB — COMPREHENSIVE METABOLIC PANEL
AG Ratio: 1.7 (calc) (ref 1.0–2.5)
ALBUMIN MSPROF: 4.2 g/dL (ref 3.6–5.1)
ALT: 9 U/L (ref 6–29)
AST: 10 U/L (ref 10–35)
Alkaline phosphatase (APISO): 46 U/L (ref 33–115)
BILIRUBIN TOTAL: 0.4 mg/dL (ref 0.2–1.2)
BUN: 15 mg/dL (ref 7–25)
CALCIUM: 9.5 mg/dL (ref 8.6–10.2)
CO2: 22 mmol/L (ref 20–32)
Chloride: 108 mmol/L (ref 98–110)
Creat: 0.62 mg/dL (ref 0.50–1.10)
GLOBULIN: 2.5 g/dL (ref 1.9–3.7)
GLUCOSE: 97 mg/dL (ref 65–99)
Potassium: 4.2 mmol/L (ref 3.5–5.3)
Sodium: 139 mmol/L (ref 135–146)
Total Protein: 6.7 g/dL (ref 6.1–8.1)

## 2018-01-30 LAB — TSH: TSH: 1.24 mIU/L

## 2018-01-30 LAB — LIPID PANEL
Cholesterol: 176 mg/dL (ref <200)
HDL: 78 mg/dL (ref >50)
LDL CHOLESTEROL (CALC): 86 mg/dL
Non-HDL Cholesterol (Calc): 98 mg/dL (calc) (ref <130)
TRIGLYCERIDES: 43 mg/dL (ref <150)
Total CHOL/HDL Ratio: 2.3 (calc) (ref <5.0)

## 2018-01-30 LAB — CBC
HCT: 31 % — ABNORMAL LOW (ref 35.0–45.0)
HEMOGLOBIN: 8.6 g/dL — AB (ref 11.7–15.5)
MCH: 18 pg — AB (ref 27.0–33.0)
MCHC: 27.7 g/dL — AB (ref 32.0–36.0)
MCV: 64.7 fL — AB (ref 80.0–100.0)
MPV: 9.3 fL (ref 7.5–12.5)
PLATELETS: 479 10*3/uL — AB (ref 140–400)
RBC: 4.79 10*6/uL (ref 3.80–5.10)
RDW: 16.6 % — AB (ref 11.0–15.0)
WBC: 4.1 10*3/uL (ref 3.8–10.8)

## 2018-01-30 MED ORDER — VITAMIN D (ERGOCALCIFEROL) 1.25 MG (50000 UNIT) PO CAPS: 50000.0000 [IU] | ORAL_CAPSULE | ORAL | 0 refills | Status: DC

## 2018-01-30 NOTE — Patient Instructions (Signed)
1. Encounter for routine gynecological examination with Papanicolaou smear of cervix Gynecologic exam with mild increase in uterine side secondary to fibroids.  Pap reflex done.  Breast exam normal.  Will schedule screening mammogram now.  Follow-up for fasting health labs.  Body mass index 26.96. - CBC; Future - Comp Met (CMET); Future - TSH; Future - Lipid panel; Future - VITAMIN D 25 Hydroxy (Vit-D Deficiency, Fractures); Future  2. S/P tubal ligation  3. Menorrhagia with regular cycle Menorrhagia for a long time probably secondary to uterine fibroids.  Given her history of pulmonary embolism.  Limited in terms of medical treatment, hormonal treatment contraindicated.  Will proceed with Total Hysterectomy/Bilateral Salpingectomy, LAVH vs Robotic TLH. - US Transvaginal Non-OB; Future  4. Fibroids Follow-up to evaluate by pelvic ultrasound. - US Transvaginal Non-OB; Future  5. Hx pulmonary embolism Not on anticoagulation currently.  6. Irregular heart rate Irregular heart rate especially at night without associated chest pain.  Decision to refer to Cardio, especially in the context of history of pulmonary embolism, probable anemia and the plan for a hysterectomy.[name], it was a pleasure seeing you today!  I will inform you of your results as soon as they are available.

## 2018-02-04 ENCOUNTER — Telehealth: Payer: Self-pay

## 2018-02-04 ENCOUNTER — Telehealth: Payer: Self-pay | Admitting: *Deleted

## 2018-02-04 DIAGNOSIS — I499 Cardiac arrhythmia, unspecified: Secondary | ICD-10-CM

## 2018-02-04 NOTE — Telephone Encounter (Signed)
Appointment on 02/12/18 @ 3:20pm with Dr. Oval Linsey.

## 2018-02-04 NOTE — Telephone Encounter (Signed)
-----   Message from Princess Bruins, MD sent at 01/24/2018  4:59 PM EST ----- Regarding: Refer to Cardiology Irregular HR.

## 2018-02-04 NOTE — Telephone Encounter (Signed)
Referral placed in [facility] at [facility], they will call to schedule.

## 2018-02-04 NOTE — Telephone Encounter (Signed)
Per Result note from Dr. Marguerita Merles "Organize Feraheme IV x 2".  I called patient and she answered in a doctor appt with son and asked me to call back and leave her a message. I left message. Patient was informed that first available appt for Feraheme IV infusion at [place] is Tuesday, Dec 10 AT 10:00am.  Order was faxed last week.

## 2018-02-05 ENCOUNTER — Ambulatory Visit
Admission: RE | Admit: 2018-02-05 | Discharge: 2018-02-05 | Disposition: A | Discharge status: Home / Self Care | Payer: 59 | Source: Ambulatory Visit | Attending: Obstetrics & Gynecology | Admitting: Obstetrics & Gynecology

## 2018-02-05 DIAGNOSIS — Z1231 Encounter for screening mammogram for malignant neoplasm of breast: Secondary | ICD-10-CM

## 2018-02-06 ENCOUNTER — Encounter: Payer: Self-pay | Admitting: Anesthesiology

## 2018-02-12 ENCOUNTER — Other Ambulatory Visit: Payer: Self-pay | Admitting: Obstetrics & Gynecology

## 2018-02-12 ENCOUNTER — Ambulatory Visit (INDEPENDENT_AMBULATORY_CARE_PROVIDER_SITE_OTHER): Payer: 59 | Admitting: Cardiovascular Disease

## 2018-02-12 ENCOUNTER — Encounter: Payer: Self-pay | Admitting: Cardiovascular Disease

## 2018-02-12 ENCOUNTER — Encounter (HOSPITAL_COMMUNITY): Payer: 59

## 2018-02-12 VITALS — BP 136/82 | HR 83 | Ht 66.0 in | Wt 163.0 lb

## 2018-02-12 DIAGNOSIS — R928 Other abnormal and inconclusive findings on diagnostic imaging of breast: Secondary | ICD-10-CM

## 2018-02-12 DIAGNOSIS — R002 Palpitations: Secondary | ICD-10-CM | POA: Diagnosis not present

## 2018-02-12 NOTE — Progress Notes (Signed)
Cardiology Office Note   Date:  02/12/2018   ID:  [name], DOB 1970/09/23, MRN [id number]  PCP:  Laurey Morale, MD  Cardiologist:   Skeet Latch, MD   No chief complaint on file.    History of Present Illness: [name] is a 47 y.o. female with prior PE and symptomatic anemia who is being seen today for the evaluation of palpitations at the request of Dr. Dellis Filbert.  [name] reports increasing episodes of palpitations.  She for started having palpitations when she was 21 or 22.  At that time she was treated with an unknown medication which helped it.  The symptoms improved and she stopped taking the medicine after a couple of years.  She think she had an echocardiogram and an EKG at that time that were relatively unremarkable.  Lately she is starting to notice increasing symptoms.  She now has palpitations that occur daily, sometimes multiple times per day.  She has a sensation of skipping heartbeats that can last up to 10 minutes.  She also has a sustained racing heartbeat that can last up to an hour.  The symptoms seem to be triggered by exertion.  Sometimes even minimal exertion such as reaching over her head or bending over can cause her to have these arrhythmias.  They are associated with shortness of breath, lightheadedness, and a weakness in her legs.   [name] has not been feeling anxious or depressed.  She drinks one cup of coffee daly.  She doesn't use any over the counter cold or cough medication or drink alcohol heavily.  Of note she is anemic and is being treated for menorrhagia.  She doesn't think that her palpitations are related to her menstrual cycle.   Past Medical History:  Diagnosis Date  . Cardiac arrhythmia   . Cellulitis   . Chronic headaches   . Chronic lumbar pain since 2013   sees Dr. Arvilla Market   . Clotting disorder ([place])   . Interstitial cystitis   . PE (pulmonary embolism)    due to surgery  . Sepsis ([place])    due to surgery   . Staph infection     Past Surgical History:  Procedure Laterality Date  . BREAST REDUCTION SURGERY    . EXPLORATORY LAPAROTOMY     with left ovary and mass removed  . RADIOFREQUENCY ABLATION NERVES     lumbar spine, per Dr. Ace Gins      Current Outpatient Medications  Medication Sig Dispense Refill  . B Complex Vitamins (VITAMIN B COMPLEX PO) Take 1 tablet by mouth daily.    . Cholecalciferol (VITAMIN D) 2000 units tablet Take 2,000 Units by mouth daily.    Marland Kitchen ibuprofen (ADVIL,MOTRIN) 200 MG tablet Take 200 mg by mouth as needed.    . Vitamin D, Ergocalciferol, (DRISDOL) 1.25 MG (50000 UT) CAPS capsule Take 1 capsule (50,000 Units total) by mouth every 7 (seven) days. 12 capsule 0   Current Facility-Administered Medications  Medication Dose Route Frequency Provider Last Rate Last Dose  . 0.9 %  sodium chloride infusion  500 mL Intravenous Continuous Armbruster, Carlota Raspberry, MD        Allergies:   Patient has no known allergies.    Social History:  The patient  reports that she has quit smoking. Her smoking use included cigarettes. She quit after 1.00 year of use. She has never used smokeless tobacco. She reports that she drinks alcohol. She reports that she  does not use drugs.   Family History:  The patient's family history includes Cancer in her paternal aunt; Colon polyps in her maternal grandmother, mother, and paternal grandmother; Diabetes in her maternal grandfather, paternal grandfather, and sister; Heart Problems in her mother and sister; Heart defect in her paternal grandmother; Heart disease in her maternal grandfather and paternal grandfather; Hypertension in her father, maternal grandfather, maternal grandmother, paternal grandfather, paternal grandmother, and sister; Irritable bowel syndrome in her mother and paternal aunt; Liver disease in her father.    ROS:  Please see the history of present illness.   Otherwise, review of systems are positive for none.   All other  systems are reviewed and negative.    PHYSICAL EXAM: VS:  BP 136/82   Pulse 83   Ht 5\' 6"  (1.676 m)   Wt 163 lb (73.9 kg)   BMI 26.31 kg/m  , BMI Body mass index is 26.31 kg/m. GENERAL:  Well appearing HEENT:  Pupils equal round and reactive, fundi not visualized, oral mucosa unremarkable NECK:  No jugular venous distention, waveform within normal limits, carotid upstroke brisk and symmetric, no bruits, no thyromegaly LYMPHATICS:  No cervical adenopathy LUNGS:  Clear to auscultation bilaterally HEART:  RRR.  PMI not displaced or sustained,S1 and S2 within normal limits, no S3, no S4, no clicks, no rubs, no murmurs ABD:  Flat, positive bowel sounds normal in frequency in pitch, no bruits, no rebound, no guarding, no midline pulsatile mass, no hepatomegaly, no splenomegaly EXT:  2 plus pulses throughout, no edema, no cyanosis no clubbing SKIN:  No rashes no nodules NEURO:  Cranial nerves II through XII grossly intact, motor grossly intact throughout PSYCH:  Cognitively intact, oriented to person place and time    EKG:  EKG is ordered today. The ekg ordered today demonstrates sinus rhythm.  Rate 83 bpm.  Short PR interval. ?delta wave   Recent Labs: 02/15/2017: Magnesium 1.8 01/28/2018: ALT 9; BUN 15; Creat 0.62; Hemoglobin 8.6; Platelets 479; Potassium 4.2; Sodium 139; TSH 1.24    Lipid Panel    Component Value Date/Time   CHOL 176 01/28/2018 0849   TRIG 43 01/28/2018 0849   HDL 78 01/28/2018 0849   CHOLHDL 2.3 01/28/2018 0849   VLDL 12.0 02/15/2017 1036   LDLCALC 86 01/28/2018 0849      Wt Readings from Last 3 Encounters:  02/12/18 163 lb (73.9 kg)  01/24/18 162 lb (73.5 kg)  02/15/17 159 lb 9.6 oz (72.4 kg)      ASSESSMENT AND PLAN:  # Palpitations: It sounds like [name] may have episodes of SVT.  Her PR interval is a little short and there is a hint of a delta wave on her EKG, though there is no obvious WPW.  We will get a 7 day event monitor.  Anemia  could be contributing and this is being addressed by her OB/GYN.  Labs, including thyroid function, have been otherwise unremarkable.     Current medicines are reviewed at length with the patient today.  The patient does not have concerns regarding medicines.  The following changes have been made:  no change  Labs/ tests ordered today include:  No orders of the defined types were placed in this encounter.    Disposition:   FU with Kerolos Nehme C. Oval Linsey, MD, [facility] or APP in 3 weeks.     Signed, Skarleth Delmonico C. Oval Linsey, MD, [facility]  02/12/2018 3:34 PM    [place]

## 2018-02-12 NOTE — Patient Instructions (Addendum)
Medication Instructions:  Your physician recommends that you continue on your current medications as directed. Please refer to the Current Medication list given to you today.  If you need a refill on your cardiac medications before your next appointment, please call your pharmacy.   Lab work: NONE  Testing/Procedures: Your physician has recommended that you wear an event monitor. Event monitors are medical devices that record the heart's electrical activity. Doctors most often [place] these monitors to diagnose arrhythmias. Arrhythmias are problems with the speed or rhythm of the heartbeat. The monitor is a small, portable device. You can wear one while you do your normal daily activities. This is usually used to diagnose what is causing palpitations/syncope (passing out). [place] [address]   Follow-Up: At [facility], you and your health needs are our priority.  As part of our continuing mission to provide you with exceptional heart care, we have created designated Provider Care Teams.  These Care Teams include your primary Cardiologist (physician) and Advanced Practice Providers (APPs -  Physician Assistants and Nurse Practitioners) who all work together to provide you with the care you need, when you need it. You will need a follow up appointment in 3 weeks WITH PA/NP    Cardiac Event Monitoring A cardiac event monitor is a small recording device that is used to detect abnormal heart rhythms (arrhythmias). The monitor is used to record your heart rhythm when you have symptoms, such as:  Fast heartbeats (palpitations), such as heart racing or fluttering.  Dizziness.  Fainting or light-headedness.  Unexplained weakness.  Some monitors are wired to electrodes placed on your chest. Electrodes are flat, sticky disks that attach to your skin. Other monitors may be hand-held or worn on the wrist. The monitor can be worn for up to 30 days. If the monitor is  attached to your chest, a technician will prepare your chest for the electrode placement and show you how to work the monitor. Take time to practice using the monitor before you leave the office. Make sure you understand how to send the information from the monitor to your health care provider. In some cases, you may need to use a landline telephone instead of a cell phone. What are the risks? Generally, this device is safe to use, but it possible that the skin under the electrodes will become irritated. How to use your cardiac event monitor  Wear your monitor at all times, except when you are in water: ? Do not let the monitor get wet. ? Take the monitor off when you bathe. Do not swim or use a hot tub with it on.  Keep your skin clean. Do not put body lotion or moisturizer on your chest.  Change the electrodes as told by your health care provider or any time they stop sticking to your skin. You may need to use medical tape to keep them on.  Try to put the electrodes in slightly different places on your chest to help prevent skin irritation. They must remain in the area under your left breast and in the upper right section of your chest.  Make sure the monitor is safely clipped to your clothing or in a location close to your body that your health care provider recommends.  Press the button to record as soon as you feel heart-related symptoms, such as: ? Dizziness. ? Weakness. ? Light-headedness. ? Palpitations. ? Thumping or pounding in your chest. ? Shortness of breath. ?  Unexplained weakness.  Keep a diary of your activities, such as walking, doing chores, and taking medicine. It is very important to note what you were doing when you pushed the button to record your symptoms. This will help your health care provider determine what might be contributing to your symptoms.  Send the recorded information as recommended by your health care provider. It may take some time for your health  care provider to process the results.  Change the batteries as told by your health care provider.  Keep electronic devices away from your monitor. This includes: ? Tablets. ? MP3 players. ? Cell phones.  While wearing your monitor you should avoid: ? Electric blankets. ? Armed forces operational officer. ? Electric toothbrushes. ? Microwave ovens. ? Magnets. ? Metal detectors. Get help right away if:  You have chest pain.  You have extreme difficulty breathing or shortness of breath.  You develop a very fast heartbeat that persists.  You develop dizziness that does not go away.  You faint or constantly feel like you are about to faint. Summary  A cardiac event monitor is a small recording device that is used to help detect abnormal heart rhythms (arrhythmias).  The monitor is used to record your heart rhythm when you have heart-related symptoms.  Make sure you understand how to send the information from the monitor to your health care provider.  It is important to press the button on the monitor when you have any heart-related symptoms.  Keep a diary of your activities, such as walking, doing chores, and taking medicine. It is very important to note what you were doing when you pushed the button to record your symptoms. This will help your health care provider learn what might be causing your symptoms. This information is not intended to replace advice given to you by your health care provider. Make sure you discuss any questions you have with your health care provider. Document Released: 11/30/2007 Document Revised: 02/05/2016 Document Reviewed: 02/05/2016 Elsevier Interactive Patient Education  2017 [employer].

## 2018-02-14 ENCOUNTER — Ambulatory Visit: Payer: 59

## 2018-02-14 ENCOUNTER — Ambulatory Visit
Admission: RE | Admit: 2018-02-14 | Discharge: 2018-02-14 | Disposition: A | Discharge status: Home / Self Care | Payer: 59 | Source: Ambulatory Visit | Attending: Obstetrics & Gynecology | Admitting: Obstetrics & Gynecology

## 2018-02-14 DIAGNOSIS — R928 Other abnormal and inconclusive findings on diagnostic imaging of breast: Secondary | ICD-10-CM

## 2018-02-18 ENCOUNTER — Other Ambulatory Visit (HOSPITAL_COMMUNITY): Payer: Self-pay | Admitting: *Deleted

## 2018-02-19 ENCOUNTER — Encounter: Payer: Self-pay | Admitting: Cardiovascular Disease

## 2018-02-19 ENCOUNTER — Ambulatory Visit (HOSPITAL_COMMUNITY)
Admission: RE | Admit: 2018-02-19 | Discharge: 2018-02-19 | Disposition: A | Discharge status: Home / Self Care | Payer: 59 | Source: Ambulatory Visit | Attending: Obstetrics & Gynecology | Admitting: Obstetrics & Gynecology

## 2018-02-19 DIAGNOSIS — R002 Palpitations: Secondary | ICD-10-CM | POA: Insufficient documentation

## 2018-02-19 DIAGNOSIS — N920 Excessive and frequent menstruation with regular cycle: Secondary | ICD-10-CM | POA: Diagnosis not present

## 2018-02-19 DIAGNOSIS — D649 Anemia, unspecified: Secondary | ICD-10-CM | POA: Diagnosis not present

## 2018-02-19 DIAGNOSIS — Z86711 Personal history of pulmonary embolism: Secondary | ICD-10-CM | POA: Diagnosis not present

## 2018-02-19 DIAGNOSIS — D259 Leiomyoma of uterus, unspecified: Secondary | ICD-10-CM | POA: Diagnosis present

## 2018-02-19 HISTORY — DX: Palpitations: R00.2

## 2018-02-19 MED ORDER — SODIUM CHLORIDE 0.9 % IV SOLN
510.0000 mg | INTRAVENOUS | Status: DC
  Administered 2018-02-19: 510 mg via INTRAVENOUS
  Filled 2018-02-19: qty 17

## 2018-02-19 NOTE — Discharge Instructions (Signed)

## 2018-02-21 ENCOUNTER — Ambulatory Visit (INDEPENDENT_AMBULATORY_CARE_PROVIDER_SITE_OTHER): Payer: 59

## 2018-02-21 DIAGNOSIS — R002 Palpitations: Secondary | ICD-10-CM | POA: Diagnosis not present

## 2018-02-26 ENCOUNTER — Encounter (HOSPITAL_COMMUNITY): Payer: 59

## 2018-03-04 ENCOUNTER — Ambulatory Visit (HOSPITAL_COMMUNITY)
Admission: RE | Admit: 2018-03-04 | Discharge: 2018-03-04 | Disposition: A | Discharge status: Home / Self Care | Payer: 59 | Source: Ambulatory Visit | Attending: Obstetrics & Gynecology | Admitting: Obstetrics & Gynecology

## 2018-03-04 ENCOUNTER — Ambulatory Visit (INDEPENDENT_AMBULATORY_CARE_PROVIDER_SITE_OTHER): Payer: 59 | Admitting: Obstetrics & Gynecology

## 2018-03-04 ENCOUNTER — Encounter: Payer: Self-pay | Admitting: Obstetrics & Gynecology

## 2018-03-04 ENCOUNTER — Ambulatory Visit (INDEPENDENT_AMBULATORY_CARE_PROVIDER_SITE_OTHER): Payer: 59

## 2018-03-04 VITALS — BP 130/78

## 2018-03-04 DIAGNOSIS — D219 Benign neoplasm of connective and other soft tissue, unspecified: Secondary | ICD-10-CM

## 2018-03-04 DIAGNOSIS — Z86711 Personal history of pulmonary embolism: Secondary | ICD-10-CM

## 2018-03-04 DIAGNOSIS — N920 Excessive and frequent menstruation with regular cycle: Secondary | ICD-10-CM

## 2018-03-04 DIAGNOSIS — D649 Anemia, unspecified: Secondary | ICD-10-CM

## 2018-03-04 DIAGNOSIS — D500 Iron deficiency anemia secondary to blood loss (chronic): Secondary | ICD-10-CM | POA: Diagnosis not present

## 2018-03-04 DIAGNOSIS — D259 Leiomyoma of uterus, unspecified: Secondary | ICD-10-CM | POA: Insufficient documentation

## 2018-03-04 DIAGNOSIS — R002 Palpitations: Secondary | ICD-10-CM

## 2018-03-04 MED ORDER — SODIUM CHLORIDE 0.9 % IV SOLN
510.0000 mg | INTRAVENOUS | Status: DC
  Administered 2018-03-04: 510 mg via INTRAVENOUS
  Filled 2018-03-04: qty 17

## 2018-03-04 NOTE — Progress Notes (Signed)
[name] Aug 15, 1970 [id number]        47 y.o.  [id number] Married  RP: Fibroids/Menorrhagia for Pelvic US  HPI: Menorrhagia with uterine fibroids causing secondary anemia.  2nd Iron IV transfusion coming up.  Seen by Cardio for heart palpitations, Holter done, f/u visit 03/15/2018.  History of pulmonary embolism limiting the hormonal therapies for menorrhagia.   OB History  Gravida Para Term Preterm AB Living  4 3     1 3   SAB TAB Ectopic Multiple Live Births               # Outcome Date GA Lbr Len/2nd Weight Sex Delivery Anes PTL Lv  4 AB           3 Para           2 Para           1 Para             Past medical history,surgical history, problem list, medications, allergies, family history and social history were all reviewed and documented in the EPIC chart.   Directed ROS with pertinent positives and negatives documented in the history of present illness/assessment and plan.  Exam:  There were no vitals filed for this visit. General appearance:  Normal  Pelvic US today: T/V and T/a images.  Anteverted uterus measuring 11.57 x 8.04 x 7.27 cm.  Intramural fibroids measuring 4.4 x 3.7 cm, 3.0 x 3.6 cm, 1.7 x 2.7 cm, 2.6 x 1.8 cm, 2.2 x 2.4 cm.  Endometrial lining is thin and normal at 4.1 mm.  Right and left ovaries normal.  No free fluid in the posterior cul-de-sac.   Assessment/Plan:  47 y.o. [id number]   1. Fibroids Intramural fibroids with the largest at 4.4 cm.  Overall uterine size at 11.57 x 8.04 x 7.27 cm.  Endometrial lining thin and normal at 4.1 mm.  Given the severe menorrhagia with secondary anemia, in the context of a history of pulmonary embolism limiting hormonal treatment, decision to proceed with a robotic total laparoscopic hysterectomy and bilateral salpingectomy.  Per patient history, history of an adnexal benign lesion removed by laparotomy many years ago, which probably left adhesions.  Patient consented for possible TAH with bilateral salpingectomy  for that reason.  Surgery and risks thoroughly reviewed with patient including risks of trauma, risk of trauma to the blood vessels with blood transfusion, risk of infection, risk of pulmonary embolism and anesthesiology risks.  Patient informed that she will be put on heparin prophylaxis during the surgery and during the postop time.  An antibiotic will be given IV prior to starting the surgery.  Patient will also do a bowel prep the day prior to surgery.  Will obtain the cardiology clearance before surgery.  Patient will receive a second IV infusion of iron in the near future.  We will see the patient again for a repeat CBC closer to the time of surgery and review of the cardiology consultation.  2. Menorrhagia with regular cycle As above  3. Secondary anemia Receiving second iron IV infusion.  4. Hx pulmonary embolism Will protect with heparin prophylaxis.  5. Heart palpitations Cardiology consult and clearance in process.                        Patient was counseled as to the risk of surgery to include the following:  1. Infection (prohylactic antibiotics will be administered)  2. DVT/Pulmonary Embolism (prophylactic  pneumo compression stockings will be used)  3.Trauma to internal organs requiring additional surgical procedure to repair any injury to internal organs requiring perhaps additional hospitalization days.  4.Hemmorhage requiring transfusion and blood products which carry risks such as anaphylactic reaction, hepatitis and AIDS  Patient had received literature information on the procedure scheduled and all her questions were answered and fully accepts all risk.   Counseling on above issues and coordination of care more than 50% for 25 minutes.  Princess Bruins MD, 11:29 AM 03/04/2018

## 2018-03-04 NOTE — Patient Instructions (Signed)
1. Fibroids Intramural fibroids with the largest at 4.4 cm.  Overall uterine size at 11.57 x 8.04 x 7.27 cm.  Endometrial lining thin and normal at 4.1 mm.  Given the severe menorrhagia with secondary anemia, in the context of a history of pulmonary embolism limiting hormonal treatment, decision to proceed with a robotic total laparoscopic hysterectomy and bilateral salpingectomy.  Per patient history, history of an adnexal benign lesion removed by laparotomy many years ago, which probably left adhesions.  Patient consented for possible TAH with bilateral salpingectomy for that reason.  Surgery and risks thoroughly reviewed with patient including risks of trauma, risk of trauma to the blood vessels with blood transfusion, risk of infection, risk of pulmonary embolism and anesthesiology risks.  Patient informed that she will be put on heparin prophylaxis during the surgery and during the postop time.  An antibiotic will be given IV prior to starting the surgery.  Patient will also do a bowel prep the day prior to surgery.  Will obtain the cardiology clearance before surgery.  Patient will receive a second IV infusion of iron in the near future.  We will see the patient again for a repeat CBC closer to the time of surgery and review of the cardiology consultation.  2. Menorrhagia with regular cycle As above  3. Secondary anemia Receiving second iron IV infusion.  4. Hx pulmonary embolism Will protect with heparin prophylaxis.  5. Heart palpitations Cardiology consult and clearance in process.                        Patient was counseled as to the risk of surgery to include the following:  1. Infection (prohylactic antibiotics will be administered)  2. DVT/Pulmonary Embolism (prophylactic pneumo compression stockings will be used)  3.Trauma to internal organs requiring additional surgical procedure to repair any injury to internal organs requiring perhaps additional hospitalization  days.  4.Hemmorhage requiring transfusion and blood products which carry risks such as anaphylactic reaction, hepatitis and AIDS  Patient had received literature information on the procedure scheduled and all her questions were answered and fully accepts all risk.  Kathlee, it was a pleasure seeing you today!

## 2018-03-15 ENCOUNTER — Encounter: Payer: Self-pay | Admitting: Cardiology

## 2018-03-15 ENCOUNTER — Ambulatory Visit (INDEPENDENT_AMBULATORY_CARE_PROVIDER_SITE_OTHER): Payer: 59 | Admitting: Cardiology

## 2018-03-15 VITALS — BP 128/78 | HR 65 | Ht 66.0 in | Wt 163.2 lb

## 2018-03-15 DIAGNOSIS — R002 Palpitations: Secondary | ICD-10-CM | POA: Diagnosis not present

## 2018-03-15 DIAGNOSIS — D509 Iron deficiency anemia, unspecified: Secondary | ICD-10-CM | POA: Diagnosis not present

## 2018-03-15 DIAGNOSIS — Z86711 Personal history of pulmonary embolism: Secondary | ICD-10-CM | POA: Diagnosis not present

## 2018-03-15 MED ORDER — METOPROLOL TARTRATE 25 MG PO TABS: 12.5000 mg | ORAL_TABLET | Freq: Two times a day (BID) | ORAL | 5 refills | Status: DC | PRN

## 2018-03-15 NOTE — Progress Notes (Signed)
03/15/2018 [name]   [birth year]  [id number]  Primary Physician Laurey Morale, MD Primary Cardiologist: Dr Oval Linsey  HPI: The patient is a pleasant 48 year old female with a history of palpitations.  She also had a history of a pulmonary embolism in 2000.  She was admitted to [facility] then for breast reduction surgery which was complicated by sepsis.  She ended up being transferred to [facility].  She did have a what sounds like a follow-up CT scan at [facility] in May 2000 that did not show a PE.  She was kept on anticoagulation for a couple years.  In 2003 when she was pregnant she used Lovenox.  After this her anticoagulation was stopped.  She was referred to Dr. Oval Linsey for palpitations on 02/12/2018.  She admits she has had these for years, she says she was on medication for a time for palpitations when she was younger but stopped taking it in her 14s.  She says for the past 6 months she has had increasing episodes.  She describes a skipped beat followed by a heartbeat.  She says when she gets these she is symptomatic, she becomes dizzy and weak.  She is afraid to exercise because she thinks it will get worse.  She is not had syncope.  Does not sound like she is describing any sustained tachycardia.  In addition of this she is also significantly anemic.  She has a hemoglobin of 8.6, she is receiving some iron injections and this is being followed by her OB/GYN and her primary care doctor.    Dr. Oval Linsey ordered TSH and a monitor.  7-day monitor showed sinus rhythm sinus arrhythmia and a few PACs and PVCs.  She is in the office today for follow-up.  Her symptoms are about the same.   Current Outpatient Medications  Medication Sig Dispense Refill  . B Complex Vitamins (VITAMIN B COMPLEX PO) Take 1 tablet by mouth daily.    . Cholecalciferol (VITAMIN D) 2000 units tablet Take 2,000 Units by mouth daily.    Marland Kitchen ibuprofen (ADVIL,MOTRIN) 200 MG tablet Take 200 mg by mouth as  needed.    . Vitamin D, Ergocalciferol, (DRISDOL) 1.25 MG (50000 UT) CAPS capsule Take 1 capsule (50,000 Units total) by mouth every 7 (seven) days. 12 capsule 0  . metoprolol tartrate (LOPRESSOR) 25 MG tablet Take 0.5 tablets (12.5 mg total) by mouth 2 (two) times daily as needed (for palpitations). 30 tablet 5   Current Facility-Administered Medications  Medication Dose Route Frequency Provider Last Rate Last Dose  . 0.9 %  sodium chloride infusion  500 mL Intravenous Continuous Armbruster, Carlota Raspberry, MD        No Known Allergies  Past Medical History:  Diagnosis Date  . Cardiac arrhythmia   . Cellulitis   . Chronic headaches   . Chronic lumbar pain since 2013   sees Dr. Arvilla Market   . Clotting disorder ([place])   . Interstitial cystitis   . Palpitations 02/19/2018  . PE (pulmonary embolism)    due to surgery  . Sepsis ([place])    due to surgery  . Staph infection     Social History   Socioeconomic History  . Marital status: Married    Spouse name: Not on file  . Number of children: 3  . Years of education: Not on file  . Highest education level: Not on file  Occupational History  . Occupation: Glass blower/designer  Social Needs  . Financial  resource strain: Not on file  . Food insecurity:    Worry: Not on file    Inability: Not on file  . Transportation needs:    Medical: Not on file    Non-medical: Not on file  Tobacco Use  . Smoking status: Former Smoker    Years: 1.00    Types: Cigarettes  . Smokeless tobacco: Never Used  . Tobacco comment: age 40  Substance and Sexual Activity  . Alcohol use: Yes    Alcohol/week: 0.0 standard drinks    Comment: glass of wine occasionally  . Drug use: No  . Sexual activity: Yes    Partners: Male  Lifestyle  . Physical activity:    Days per week: Not on file    Minutes per session: Not on file  . Stress: Not on file  Relationships  . Social connections:    Talks on phone: Not on file    Gets together: Not on file     Attends religious service: Not on file    Active member of club or organization: Not on file    Attends meetings of clubs or organizations: Not on file    Relationship status: Not on file  . Intimate partner violence:    Fear of current or ex partner: Not on file    Emotionally abused: Not on file    Physically abused: Not on file    Forced sexual activity: Not on file  Other Topics Concern  . Not on file  Social History Narrative  . Not on file     Family History  Problem Relation Age of Onset  . Colon polyps Mother   . Irritable bowel syndrome Mother   . Heart defect Mother        tachycardia  . Colon polyps Paternal Grandmother   . Hypertension Paternal Grandmother   . Heart defect Paternal Grandmother   . Heart attack Paternal Grandmother   . Colon polyps Maternal Grandmother   . Hypertension Maternal Grandmother   . Diabetes Paternal Grandfather   . Heart disease Paternal Grandfather   . Hypertension Paternal Grandfather   . Heart attack Paternal Grandfather   . Diabetes Maternal Grandfather   . Heart disease Maternal Grandfather   . Hypertension Maternal Grandfather   . Diabetes Sister   . Hypertension Sister   . Heart Problems Sister   . Irritable bowel syndrome Paternal Aunt   . Cancer Paternal Aunt        melanoma  . Liver disease Father   . Hypertension Father   . COPD Father   . Colon cancer Neg Hx   . Esophageal cancer Neg Hx   . Rectal cancer Neg Hx   . Stomach cancer Neg Hx      Review of Systems: General: negative for chills, fever, night sweats or weight changes.  Cardiovascular: negative for chest pain, dyspnea on exertion, edema, orthopnea, paroxysmal nocturnal dyspnea or shortness of breath Dermatological: negative for rash Respiratory: negative for cough or wheezing Urologic: negative for hematuria Abdominal: negative for nausea, vomiting, diarrhea, bright red blood per rectum, melena, or hematemesis Neurologic: negative for visual changes,  syncope, or dizziness All other systems reviewed and are otherwise negative except as noted above.    Blood pressure 128/78, pulse 65, height 5\' 6"  (1.676 m), weight 163 lb 3.2 oz (74 kg), last menstrual period 02/15/2018, SpO2 99 %.  General appearance: alert, cooperative and no distress Lungs: clear to auscultation bilaterally Heart: regular rate and rhythm  Extremities: no edema Skin: Skin color, texture, turgor normal. No rashes or lesions Neurologic: Grossly normal   ASSESSMENT AND PLAN:   Palpitations Sounds like PACs, PVCs, and sinus arrhythmia. & day monitor did not demonstrate arrhythmia   Iron deficiency anemia She has received "iron injections" recently  History of pulmonary embolus (PE) H/O PE after breast reduction surgery in 0175 complicated by sepsis and PE. CTA May 2000 and in 2008 were negative for PE.   PLAN  Reassurance. Try Lopressor 12.5 mg PRN.  I wonder if her symptoms aren't exacerbated by her anemia. F/U with Dr Oval Linsey in 6 weeks to determine if further testing (echo or stress) needed.   [name] PA-C 03/15/2018 2:01 PM

## 2018-03-15 NOTE — Assessment & Plan Note (Signed)
She has received "iron injections" recently

## 2018-03-15 NOTE — Patient Instructions (Addendum)
Medication Instructions:  Your physician has recommended you make the following change in your medication: START Metoprolol 12.5mg  twice daily as needed for palpitations  If you need a refill on your cardiac medications before your next appointment, please call your pharmacy.   Lab work: None ordered If you have labs (blood work) drawn today and your tests are completely normal, you will receive your results only by: Marland Kitchen MyChart Message (if you have MyChart) OR . A paper copy in the mail If you have any lab test that is abnormal or we need to change your treatment, we will call you to review the results.  Testing/Procedures: None ordered  Follow-Up: At [facility], you and your health needs are our priority.  As part of our continuing mission to provide you with exceptional heart care, we have created designated Provider Care Teams.  These Care Teams include your primary Cardiologist (physician) and Advanced Practice Providers (APPs -  Physician Assistants and Nurse Practitioners) who all work together to provide you with the care you need, when you need it. You will need a follow up appointment in 6 weeks with Dr. Oval Linsey.

## 2018-03-15 NOTE — Assessment & Plan Note (Signed)
Sounds like PACs, PVCs, and sinus arrhythmia. & day monitor did not demonstrate arrhythmia

## 2018-03-15 NOTE — Assessment & Plan Note (Signed)
H/O PE after breast reduction surgery in 7034 complicated by sepsis and PE. CTA May 2000 and in 2008 were negative for PE.

## 2018-03-21 ENCOUNTER — Ambulatory Visit: Payer: 59 | Admitting: Obstetrics & Gynecology

## 2018-03-21 ENCOUNTER — Other Ambulatory Visit: Payer: 59

## 2018-03-21 ENCOUNTER — Telehealth: Payer: Self-pay

## 2018-03-21 NOTE — Telephone Encounter (Signed)
I called patient and left voice mail (per DPR access note on file) that March schedule is open. I told her 3/1 and 3/4 are available dates for robotic surgery.  I told her that I had checked her ins and had the financial info as well. Asked her to call me when she can.

## 2018-04-08 ENCOUNTER — Telehealth: Payer: Self-pay

## 2018-04-08 NOTE — Telephone Encounter (Signed)
I left message for patient to call me to let me know if she would like the only March date I  Have left for Robotic surgery.

## 2018-04-25 ENCOUNTER — Telehealth: Payer: Self-pay

## 2018-04-25 NOTE — Telephone Encounter (Signed)
Patient called to tell me that she received my messages about scheduling surgery but she is simply not in a place to be able to do that at this time. Her child has been very sick this past year and has left them with some financial struggles and big hospital bills with [facility]. She said she is not financially able at this time.  She acknowledges that she know she needs to do this surgery but cannot now.   I called her back and left voice mail that I received her message. No pressure. Just call me when she is ready and keep in mind that we run a couple of mos ahead scheduling.

## 2018-05-06 ENCOUNTER — Ambulatory Visit: Payer: 59 | Admitting: Cardiovascular Disease

## 2018-09-30 ENCOUNTER — Encounter: Payer: Self-pay | Admitting: Gastroenterology

## 2018-12-11 ENCOUNTER — Other Ambulatory Visit: Payer: Self-pay

## 2018-12-11 ENCOUNTER — Telehealth (INDEPENDENT_AMBULATORY_CARE_PROVIDER_SITE_OTHER): Payer: 59 | Admitting: Family Medicine

## 2018-12-11 DIAGNOSIS — R5383 Other fatigue: Secondary | ICD-10-CM | POA: Diagnosis not present

## 2018-12-11 DIAGNOSIS — M791 Myalgia, unspecified site: Secondary | ICD-10-CM

## 2018-12-11 DIAGNOSIS — R05 Cough: Secondary | ICD-10-CM

## 2018-12-11 DIAGNOSIS — Z20828 Contact with and (suspected) exposure to other viral communicable diseases: Secondary | ICD-10-CM | POA: Diagnosis not present

## 2018-12-11 DIAGNOSIS — R059 Cough, unspecified: Secondary | ICD-10-CM

## 2018-12-11 NOTE — Progress Notes (Signed)
This visit type was conducted due to national recommendations for restrictions regarding the COVID-19 pandemic in an effort to limit this patient's exposure and mitigate transmission in our community.   Virtual Visit via Video Note  I connected with [name] on 12/11/18 at  5:15 PM EDT by a video enabled telemedicine application and verified that I am speaking with the correct person using two identifiers.  Location patient: home Location provider:work or home office Persons participating in the virtual visit: patient, provider  I discussed the limitations of evaluation and management by telemedicine and the availability of in person appointments. The patient expressed understanding and agreed to proceed.   HPI: [name] is seen with onset Monday of cough.  By Tuesday she had increasing fatigue and myalgias and severe body aches with temperature 100.4.  She has had some lightheadedness.  Mild dyspnea with activity.  She has had some mild diarrhea today.  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  Also, late last week she was around her son's girlfriend who had some nasal congestion.  Son's girlfriend had a friend that reportedly tested positive for COVID.  No loss of taste or smell.  No chronic heart or lung problems.   ROS: See pertinent positives and negatives per HPI.  Past Medical History:  Diagnosis Date  . Cardiac arrhythmia   . Cellulitis   . Chronic headaches   . Chronic lumbar pain since 2013   sees Dr. Arvilla Market   . Clotting disorder ([place])   . Interstitial cystitis   . Palpitations 02/19/2018  . PE (pulmonary embolism)    due to surgery  . Sepsis ([place])    due to surgery  . Staph infection     Past Surgical History:  Procedure Laterality Date  . BREAST REDUCTION SURGERY    . EXPLORATORY LAPAROTOMY     with left ovary and mass removed  . RADIOFREQUENCY ABLATION NERVES     lumbar spine, per Dr. Ace Gins     Family History  Problem  Relation Age of Onset  . Colon polyps Mother   . Irritable bowel syndrome Mother   . Heart defect Mother        tachycardia  . Colon polyps Paternal Grandmother   . Hypertension Paternal Grandmother   . Heart defect Paternal Grandmother   . Heart attack Paternal Grandmother   . Colon polyps Maternal Grandmother   . Hypertension Maternal Grandmother   . Diabetes Paternal Grandfather   . Heart disease Paternal Grandfather   . Hypertension Paternal Grandfather   . Heart attack Paternal Grandfather   . Diabetes Maternal Grandfather   . Heart disease Maternal Grandfather   . Hypertension Maternal Grandfather   . Diabetes Sister   . Hypertension Sister   . Heart Problems Sister   . Irritable bowel syndrome Paternal Aunt   . Cancer Paternal Aunt        melanoma  . Liver disease Father   . Hypertension Father   . COPD Father   . Colon cancer Neg Hx   . Esophageal cancer Neg Hx   . Rectal cancer Neg Hx   . Stomach cancer Neg Hx     SOCIAL HX: Non-smoker   Current Outpatient Medications:  .  B Complex Vitamins (VITAMIN B COMPLEX PO), Take 1 tablet by mouth daily., Disp: , Rfl:  .  Cholecalciferol (VITAMIN D) 2000 units tablet, Take 2,000 Units by mouth daily., Disp: , Rfl:  .  ibuprofen (  ADVIL,MOTRIN) 200 MG tablet, Take 200 mg by mouth as needed., Disp: , Rfl:  .  metoprolol tartrate (LOPRESSOR) 25 MG tablet, Take 0.5 tablets (12.5 mg total) by mouth 2 (two) times daily as needed (for palpitations)., Disp: 30 tablet, Rfl: 5 .  Vitamin D, Ergocalciferol, (DRISDOL) 1.25 MG (50000 UT) CAPS capsule, Take 1 capsule (50,000 Units total) by mouth every 7 (seven) days., Disp: 12 capsule, Rfl: 0  Current Facility-Administered Medications:  .  0.9 %  sodium chloride infusion, 500 mL, Intravenous, Continuous, Armbruster, Carlota Raspberry, MD  EXAM:  VITALS per patient if applicable:  GENERAL: alert, oriented, appears well and in no acute distress  HEENT: atraumatic, conjunttiva clear, no  obvious abnormalities on inspection of external nose and ears  NECK: normal movements of the head and neck  LUNGS: on inspection no signs of respiratory distress, breathing rate appears normal, no obvious gross SOB, gasping or wheezing  CV: no obvious cyanosis  MS: moves all visible extremities without noticeable abnormality  PSYCH/NEURO: pleasant and cooperative, no obvious depression or anxiety, speech and thought processing grossly intact  ASSESSMENT AND PLAN:  Discussed the following assessment and plan:  Cough with other symptoms including myalgias, fatigue, dyspnea, low-grade fever, mild diarrhea.  Symptoms suspicious for possible COVID infection  -We recommended that she stay quarantined until further evaluated -She will go tomorrow to drive up site for testing -Otherwise plenty of fluids and rest and follow-up immediately for any progressive dyspnea or other concerns    I discussed the assessment and treatment plan with the patient. The patient was provided an opportunity to ask questions and all were answered. The patient agreed with the plan and demonstrated an understanding of the instructions.   The patient was advised to call back or seek an in-person evaluation if the symptoms worsen or if the condition fails to improve as anticipated.     Carolann Littler, MD

## 2018-12-12 ENCOUNTER — Other Ambulatory Visit: Payer: Self-pay

## 2018-12-12 DIAGNOSIS — Z20822 Contact with and (suspected) exposure to covid-19: Secondary | ICD-10-CM

## 2018-12-13 LAB — NOVEL CORONAVIRUS, NAA: SARS-CoV-2, NAA: DETECTED — AB

## 2018-12-15 ENCOUNTER — Emergency Department (HOSPITAL_COMMUNITY)
Admission: EM | Admit: 2018-12-15 | Discharge: 2018-12-15 | Disposition: A | Discharge status: Home / Self Care | Payer: 59 | Attending: Emergency Medicine | Admitting: Emergency Medicine

## 2018-12-15 ENCOUNTER — Encounter (HOSPITAL_COMMUNITY): Payer: Self-pay

## 2018-12-15 ENCOUNTER — Emergency Department (HOSPITAL_COMMUNITY): Payer: 59

## 2018-12-15 ENCOUNTER — Other Ambulatory Visit: Payer: Self-pay

## 2018-12-15 DIAGNOSIS — R5383 Other fatigue: Secondary | ICD-10-CM | POA: Insufficient documentation

## 2018-12-15 DIAGNOSIS — R2243 Localized swelling, mass and lump, lower limb, bilateral: Secondary | ICD-10-CM | POA: Insufficient documentation

## 2018-12-15 DIAGNOSIS — Z79899 Other long term (current) drug therapy: Secondary | ICD-10-CM | POA: Diagnosis not present

## 2018-12-15 DIAGNOSIS — M7918 Myalgia, other site: Secondary | ICD-10-CM | POA: Diagnosis not present

## 2018-12-15 DIAGNOSIS — I10 Essential (primary) hypertension: Secondary | ICD-10-CM | POA: Insufficient documentation

## 2018-12-15 DIAGNOSIS — R519 Headache, unspecified: Secondary | ICD-10-CM | POA: Diagnosis not present

## 2018-12-15 DIAGNOSIS — Z87891 Personal history of nicotine dependence: Secondary | ICD-10-CM | POA: Diagnosis not present

## 2018-12-15 DIAGNOSIS — R509 Fever, unspecified: Secondary | ICD-10-CM | POA: Insufficient documentation

## 2018-12-15 DIAGNOSIS — R0602 Shortness of breath: Secondary | ICD-10-CM | POA: Diagnosis present

## 2018-12-15 DIAGNOSIS — U071 COVID-19: Secondary | ICD-10-CM | POA: Insufficient documentation

## 2018-12-15 DIAGNOSIS — R002 Palpitations: Secondary | ICD-10-CM | POA: Diagnosis not present

## 2018-12-15 LAB — COMPREHENSIVE METABOLIC PANEL
ALT: 11 U/L (ref 0–44)
AST: 36 U/L (ref 15–41)
Albumin: 3.6 g/dL (ref 3.5–5.0)
Alkaline Phosphatase: 46 U/L (ref 38–126)
Anion gap: 9 (ref 5–15)
BUN: 10 mg/dL (ref 6–20)
CO2: 25 mmol/L (ref 22–32)
Calcium: 9.7 mg/dL (ref 8.9–10.3)
Chloride: 104 mmol/L (ref 98–111)
Creatinine, Ser: 0.62 mg/dL (ref 0.44–1.00)
GFR calc Af Amer: >60 mL/min (ref >60)
GFR calc non Af Amer: >60 mL/min (ref >60)
Glucose, Bld: 98 mg/dL (ref 70–99)
Potassium: 5 mmol/L (ref 3.5–5.1)
Sodium: 138 mmol/L (ref 135–145)
Total Bilirubin: 0.3 mg/dL (ref 0.3–1.2)
Total Protein: 6.4 g/dL — ABNORMAL LOW (ref 6.5–8.1)

## 2018-12-15 LAB — CBC
HCT: 34.3 % — ABNORMAL LOW (ref 36.0–46.0)
Hemoglobin: 9.9 g/dL — ABNORMAL LOW (ref 12.0–15.0)
MCH: 21.6 pg — ABNORMAL LOW (ref 26.0–34.0)
MCHC: 28.9 g/dL — ABNORMAL LOW (ref 30.0–36.0)
MCV: 74.9 fL — ABNORMAL LOW (ref 80.0–100.0)
Platelets: 358 10*3/uL (ref 150–400)
RBC: 4.58 MIL/uL (ref 3.87–5.11)
RDW: 16.1 % — ABNORMAL HIGH (ref 11.5–15.5)
WBC: 3.1 10*3/uL — ABNORMAL LOW (ref 4.0–10.5)
nRBC: 0 % (ref 0.0–0.2)

## 2018-12-15 LAB — I-STAT BETA HCG BLOOD, ED (MC, WL, AP ONLY): I-stat hCG, quantitative: <5 m[IU]/mL (ref <5)

## 2018-12-15 MED ORDER — METOCLOPRAMIDE HCL 5 MG/ML IJ SOLN
10.0000 mg | Freq: Once | INTRAMUSCULAR | Status: AC
  Administered 2018-12-15: 20:00:00 10 mg via INTRAVENOUS
  Filled 2018-12-15: qty 2

## 2018-12-15 MED ORDER — SODIUM CHLORIDE 0.9 % IV BOLUS
1000.0000 mL | Freq: Once | INTRAVENOUS | Status: AC
  Administered 2018-12-15: 1000 mL via INTRAVENOUS

## 2018-12-15 MED ORDER — KETOROLAC TROMETHAMINE 30 MG/ML IJ SOLN
15.0000 mg | Freq: Once | INTRAMUSCULAR | Status: AC
  Administered 2018-12-15: 15 mg via INTRAVENOUS
  Filled 2018-12-15: qty 1

## 2018-12-15 NOTE — Discharge Instructions (Signed)
1. Medications: continue home medications 2. Treatment: Continue to stay well-hydrated. Gargle warm salt water and spit it out for sore throat. Take benadryl to decrease secretions. Continue to alternate between Tylenol and ibuprofen for pain and fever control. 3. Follow Up: Follow up with your primary care doctor in 5-7 days for recheck of ongoing symptoms.  Return to emergency department for emergent changing or worsening of symptoms.

## 2018-12-15 NOTE — ED Provider Notes (Signed)
[facility] EMERGENCY DEPARTMENT Provider Note   CSN: [id number] Arrival date & time: 12/15/18  1731     History   Chief Complaint Chief Complaint  Patient presents with  . Shortness of Breath    HPI [name] is a 48 y.o. female history of PE, anemia and sinus tachycardia thought to be 2/2 her anemia.  Patient presents for fever, fatigue, body aches, headache tested positive for appropriate.  Today has exertional dyspnea shortness of breath states that she has had constant symptoms that she got respiratory for physical limitations today.  States that she has had palpitations which typical for her but they have been more frequent.  She feels her heart is racing as well as irregular.  Has taken Tylenol and ibuprofen without relief.  Patient had bilateral lower extremity edema.  States that she has been less active and takes hormone pills has no cough no hemoptysis.  No shortness of breath at rest.  Is on no anticoagulation currently.  Notably patient has history of pulmonary embolism after breast reduction surgery thousand.  Patient had negative CTA in May 2009 2008 were negative for PE     HPI  Past Medical History:  Diagnosis Date  . Cardiac arrhythmia   . Cellulitis   . Chronic headaches   . Chronic lumbar pain since 2013   sees Dr. Arvilla Market   . Clotting disorder ([place])   . Interstitial cystitis   . Palpitations 02/19/2018  . PE (pulmonary embolism)    due to surgery  . Sepsis ([place])    due to surgery  . Staph infection     Patient Active Problem List   Diagnosis Date Noted  . History of pulmonary embolus (PE) 03/15/2018  . Iron deficiency anemia 03/15/2018  . Palpitations 02/19/2018  . Hypokalemia 02/24/2015  . Chronic lumbar pain 01/26/2015  . LEG EDEMA 08/05/2009  . ANXIETY 05/14/2009  . ADHD 05/14/2009  . PHARYNGITIS 03/20/2008  . ACUTE BRONCHITIS 01/08/2008  . DIZZINESS 01/01/2008  . EYE PAIN 09/09/2007  . INSOMNIA  09/09/2007  . BENIGN POSITIONAL VERTIGO 07/25/2007  . DEPRESSION 10/19/2006  . Essential hypertension 10/19/2006  . HEADACHE 10/19/2006    Past Surgical History:  Procedure Laterality Date  . BREAST REDUCTION SURGERY    . EXPLORATORY LAPAROTOMY     with left ovary and mass removed  . RADIOFREQUENCY ABLATION NERVES     lumbar spine, per Dr. Ace Gins      OB History    Gravida  4   Para  3   Term      Preterm      AB  1   Living  3     SAB      TAB      Ectopic      Multiple      Live Births               Home Medications    Prior to Admission medications   Medication Sig Start Date End Date Taking? Authorizing Provider  B Complex Vitamins (VITAMIN B COMPLEX PO) Take 1 tablet by mouth daily.    [provider]  Cholecalciferol (VITAMIN D) 2000 units tablet Take 2,000 Units by mouth daily.    [provider]  ibuprofen (ADVIL,MOTRIN) 200 MG tablet Take 200 mg by mouth as needed.    [provider]  metoprolol tartrate (LOPRESSOR) 25 MG tablet Take 0.5 tablets (12.5 mg total) by mouth 2 (two) times  daily as needed (for palpitations). 03/15/18 06/13/18  Erlene Quan, PA-C  Vitamin D, Ergocalciferol, (DRISDOL) 1.25 MG (50000 UT) CAPS capsule Take 1 capsule (50,000 Units total) by mouth every 7 (seven) days. 01/30/18   Princess Bruins, MD    Family History Family History  Problem Relation Age of Onset  . Colon polyps Mother   . Irritable bowel syndrome Mother   . Heart defect Mother        tachycardia  . Colon polyps Paternal Grandmother   . Hypertension Paternal Grandmother   . Heart defect Paternal Grandmother   . Heart attack Paternal Grandmother   . Colon polyps Maternal Grandmother   . Hypertension Maternal Grandmother   . Diabetes Paternal Grandfather   . Heart disease Paternal Grandfather   . Hypertension Paternal Grandfather   . Heart attack Paternal Grandfather   . Diabetes Maternal Grandfather   . Heart disease  Maternal Grandfather   . Hypertension Maternal Grandfather   . Diabetes Sister   . Hypertension Sister   . Heart Problems Sister   . Irritable bowel syndrome Paternal Aunt   . Cancer Paternal Aunt        melanoma  . Liver disease Father   . Hypertension Father   . COPD Father   . Colon cancer Neg Hx   . Esophageal cancer Neg Hx   . Rectal cancer Neg Hx   . Stomach cancer Neg Hx     Social History Social History   Tobacco Use  . Smoking status: Former Smoker    Years: 1.00    Types: Cigarettes  . Smokeless tobacco: Never Used  . Tobacco comment: age 21  Substance Use Topics  . Alcohol use: Yes    Alcohol/week: 0.0 standard drinks    Comment: glass of wine occasionally  . Drug use: No     Allergies   Patient has no known allergies.   Review of Systems Review of Systems   Physical Exam Updated Vital Signs BP (!) 139/91   Pulse 89   Temp 98.8 F (37.1 C) (Oral)   Resp (!) 27   Ht 5\' 6"  (1.676 m)   Wt 73.9 kg   LMP  (Approximate)   SpO2 100%   BMI 26.31 kg/m   Physical Exam Vitals signs and nursing note reviewed.  Constitutional:      General: She is not in acute distress.    Comments: Patient is well-appearing in no acute distress no accessory muscle usage talking in complete sentences does not appear winded.  Patient is satting 100% on room air during exam  HENT:     Head: Normocephalic and atraumatic.     Nose: Nose normal. No congestion.     Mouth/Throat:     Mouth: Mucous membranes are moist.  Eyes:     General: No scleral icterus. Neck:     Musculoskeletal: Normal range of motion. No neck rigidity or muscular tenderness.  Cardiovascular:     Rate and Rhythm: Normal rate and regular rhythm.     Pulses: Normal pulses.     Heart sounds: Normal heart sounds.  Pulmonary:     Effort: Pulmonary effort is normal. No respiratory distress.     Breath sounds: Normal breath sounds. No wheezing.  Abdominal:     Palpations: Abdomen is soft.      Tenderness: There is no abdominal tenderness. There is no guarding or rebound.  Musculoskeletal:     Right lower leg: No edema.  Left lower leg: No edema.     Comments: No lower extremity edema.  No calf tenderness negative Homans sign bilaterally.  No palpable chest wall tenderness.  Skin:    General: Skin is warm and dry.     Capillary Refill: Capillary refill takes less than 2 seconds.  Neurological:     Mental Status: She is alert and oriented to person, place, and time. Mental status is at baseline.     Motor: No weakness.  Psychiatric:        Mood and Affect: Mood normal.        Behavior: Behavior normal.      ED Treatments / Results  Labs (all labs ordered are listed, but only abnormal results are displayed) Labs Reviewed  COMPREHENSIVE METABOLIC PANEL  CBC  I-STAT BETA HCG BLOOD, ED ([facility], [facility], [facility] ONLY)    EKG None  Radiology No results found.  Procedures Procedures (including critical care time)  Medications Ordered in ED Medications - No data to display   Initial Impression / Assessment and Plan / ED Course  I have reviewed the triage vital signs and the nursing notes.  Pertinent labs & imaging results that were available during my care of the patient were reviewed by me and considered in my medical decision making (see chart for details).        Patient is a 48 year old female with history of pulmonary embolism and positive Corky Sox test this week presenting for shortness of breath with exertion not at rest, headache, fatigue and mild congestion.  Patient has symptoms consistent with COVID or other viral upper respiratory tract infection in general malaise, fatigue, exertional shortness of breath.  States that she feels some chest pressure but denies pain.  Patient states that she is normally quite active at home and is frustrated with her decreased ability to tolerate exercise.  Doubt ACS.  Patient has no history of DM, HLD, is not obese or overweight,  is a former smoker but no longer smokes.  Father had heart attack in his 33s. Chest pain began along with other general body aches.  Patient denies sternal pain or crushing chest pressure.  Denies nausea.  Discussed risk of ACS patient feels comfortable with conservative management and will return if concerned.   Discussed case with Dr. Ronnald Nian considering patient's prior PE was deemed due to surgery and patient has explanation for pleuritic chest pain with positive COVID test, is not tachycardic at presentation and satting 100 and on room air patient is low risk Wells criteria.  Shared decision making discussion with patient patient understands concern for PE and states that she will return to ED if she has worsening symptoms or worsening chest pain.    During ED visit patient has reassuring blood work with mild decreased WBC at 3.1 likely due to viral infection.  Hemoglobin is low at 9.9 however this is higher than her baseline anemia.  Patient has required transfusion 1 time in the past in January this year.  States that her hemoglobin is frequently 8 or so.  X-ray shows no pneumonia or focal consolidation.  EKG is normal sinus rhythm no signs of right heart strain no abnormality on my review.  Patient states she feels less achy after Toradol and headache is improved after Reglan and 1 L IV fluids.  8:55 PM patient reassessed and discussed plan to discharge home with conservative treatment patient states she is understanding of plan feels better at this time feels less short of breath  headache is improved and feels less achy in general.  States that she will return if any new or concerning symptoms.        The patient appears reasonably screened and/or stabilized for discharge and I doubt any other medical condition or other [facility] requiring further screening, evaluation, or treatment in the ED at this time prior to discharge.  Patient is hemodynamically stable, in NAD, and able to ambulate in the  ED. Pain has been managed or a plan has been made for home management and has no complaints prior to discharge. Patient is comfortable with above plan and is stable for discharge at this time. All questions were answered prior to disposition. Results from the ER workup discussed with the patient face to face and all questions answered to the best of my ability. The patient is safe for discharge with strict return precautions. Patient appears safe for discharge with appropriate follow-up.  Conveyed my impression with the patient and he voiced understanding and is agreeable to plan.   An After Visit Summary was printed and given to the patient.  Portions of this note were generated with [employer]. Dictation errors may occur despite best attempts at proofreading.     Final Clinical Impressions(s) / ED Diagnoses   Final diagnoses:  None    ED Discharge Orders    None       Tedd Sias, [place] 12/15/18 2105    Lennice Sites, DO 12/16/18 0008

## 2018-12-15 NOTE — ED Triage Notes (Signed)
Pt tested positive for COVID yesterday, states that she "hit a wall and I can't catch my breath or take a deep breath."

## 2018-12-17 ENCOUNTER — Ambulatory Visit: Payer: Self-pay | Admitting: Family Medicine

## 2018-12-17 NOTE — Telephone Encounter (Signed)
Pt tested positive for Covid 01/12/2019. States she called triage Sunday for increased SOB and was advised ED. States "CXR all test, labs normal, they did give me IV fluids."  Calling today to question if Dr. Sarajane Jews does prescribe hydroxychloroquine or any antibiotics. Discussed role of antibiotics with bacterial infections, verbalizes understanding. States she is taking zinc, Vit D and C. SOB has not worsened since Sunday. Declines appt, requesting F/U call regarding hydroxychloroquine mentioned. Advised ED if SOB worsens. Pt verbalizes understanding. CB# [phone]  Reason for Disposition . [1] Caller requesting NON-URGENT health information AND [2] PCP's office is the best resource  Answer Assessment - Initial Assessment Questions 1. REASON FOR CALL or QUESTION: "What is your reason for calling today?" or "How can I best help you?" or "What question do you have that I can help answer?"     See summary  Protocols used: INFORMATION ONLY CALL-A-AH

## 2018-12-19 NOTE — Telephone Encounter (Signed)
Please advise 

## 2018-12-20 NOTE — Telephone Encounter (Signed)
No we do not prescribe hydroxychloroquine for this because there is no data to support that. If she wants to have a virtual OV with me to discuss this further she is welcome to set it up

## 2018-12-23 MED ORDER — AZITHROMYCIN 250 MG PO TABS: ORAL_TABLET | ORAL | 0 refills | Status: DC

## 2018-12-23 NOTE — Telephone Encounter (Signed)
Left message to return phone call.

## 2018-12-23 NOTE — Telephone Encounter (Signed)
Could all be residual from recent Covid infection, but if still having productive cough OK to send in Z-pack- take as directed for 5 days.

## 2018-12-23 NOTE — Telephone Encounter (Signed)
Patient notified of update  and verbalized understanding. 

## 2018-12-23 NOTE — Telephone Encounter (Signed)
Spoke to pt and she stated that she has a lot of congestion and wants to know if she can have something called in. Pt stated that Dr.Burchette has seen her for the issue and wants to know if he has any recommendation. Pt stated that she thinks she may need a Zpak. Pt stated she wanted to avoid a VV but if she needs to she will do one.    Please advise

## 2018-12-23 NOTE — Addendum Note (Signed)
Addended by: Gwenyth Ober R on: 12/23/2018 12:42 PM   Modules accepted: Orders

## 2019-02-08 ENCOUNTER — Emergency Department (HOSPITAL_COMMUNITY)
Admission: EM | Admit: 2019-02-08 | Discharge: 2019-02-08 | Disposition: A | Discharge status: Home / Self Care | Payer: 59 | Attending: Emergency Medicine | Admitting: Emergency Medicine

## 2019-02-08 ENCOUNTER — Emergency Department (HOSPITAL_COMMUNITY): Payer: 59

## 2019-02-08 ENCOUNTER — Other Ambulatory Visit: Payer: Self-pay

## 2019-02-08 ENCOUNTER — Encounter (HOSPITAL_COMMUNITY): Payer: Self-pay | Admitting: Emergency Medicine

## 2019-02-08 DIAGNOSIS — Y999 Unspecified external cause status: Secondary | ICD-10-CM | POA: Diagnosis not present

## 2019-02-08 DIAGNOSIS — S46911A Strain of unspecified muscle, fascia and tendon at shoulder and upper arm level, right arm, initial encounter: Secondary | ICD-10-CM | POA: Diagnosis not present

## 2019-02-08 DIAGNOSIS — S161XXA Strain of muscle, fascia and tendon at neck level, initial encounter: Secondary | ICD-10-CM | POA: Diagnosis not present

## 2019-02-08 DIAGNOSIS — I10 Essential (primary) hypertension: Secondary | ICD-10-CM | POA: Insufficient documentation

## 2019-02-08 DIAGNOSIS — Z87891 Personal history of nicotine dependence: Secondary | ICD-10-CM | POA: Diagnosis not present

## 2019-02-08 DIAGNOSIS — Y92410 Unspecified street and highway as the place of occurrence of the external cause: Secondary | ICD-10-CM | POA: Insufficient documentation

## 2019-02-08 DIAGNOSIS — Z79899 Other long term (current) drug therapy: Secondary | ICD-10-CM | POA: Insufficient documentation

## 2019-02-08 DIAGNOSIS — Y9389 Activity, other specified: Secondary | ICD-10-CM | POA: Insufficient documentation

## 2019-02-08 DIAGNOSIS — S1980XA Other specified injuries of unspecified part of neck, initial encounter: Secondary | ICD-10-CM | POA: Diagnosis present

## 2019-02-08 MED ORDER — HYDROCODONE-ACETAMINOPHEN 5-325 MG PO TABS
2.0000 | ORAL_TABLET | Freq: Once | ORAL | Status: AC
  Administered 2019-02-08: 2 via ORAL
  Filled 2019-02-08: qty 2
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
## 2019-02-08 NOTE — Discharge Instructions (Addendum)
Use Tylenol, ibuprofen and ice regularly for pain.  Follow-up with your physician as needed. Return for new or worsening conditions.
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
## 2019-02-10 ENCOUNTER — Other Ambulatory Visit: Payer: Self-pay

## 2019-02-11 ENCOUNTER — Encounter: Payer: Self-pay | Admitting: Family Medicine

## 2019-02-11 ENCOUNTER — Ambulatory Visit (INDEPENDENT_AMBULATORY_CARE_PROVIDER_SITE_OTHER): Payer: 59 | Admitting: Family Medicine

## 2019-02-11 VITALS — BP 122/80 | HR 99 | Temp 98.2°F | Ht 66.0 in | Wt 168.0 lb

## 2019-02-11 DIAGNOSIS — S335XXD Sprain of ligaments of lumbar spine, subsequent encounter: Secondary | ICD-10-CM

## 2019-02-11 DIAGNOSIS — S139XXD Sprain of joints and ligaments of unspecified parts of neck, subsequent encounter: Secondary | ICD-10-CM | POA: Diagnosis not present

## 2019-02-11 DIAGNOSIS — S43401D Unspecified sprain of right shoulder joint, subsequent encounter: Secondary | ICD-10-CM

## 2019-02-11 DIAGNOSIS — S0340XD Sprain of jaw, unspecified side, subsequent encounter: Secondary | ICD-10-CM

## 2019-02-11 MED ORDER — CYCLOBENZAPRINE HCL 10 MG PO TABS: 10.0000 mg | ORAL_TABLET | Freq: Three times a day (TID) | ORAL | 2 refills | Status: DC | PRN

## 2019-02-11 MED ORDER — METHYLPREDNISOLONE 4 MG PO TBPK: ORAL_TABLET | ORAL | 0 refills | Status: DC
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
## 2019-02-11 NOTE — Patient Instructions (Signed)
Health Maintenance Due  Topic Date Due  . TETANUS/TDAP  09/09/1991  . INFLUENZA VACCINE  10/05/2018  . COLONOSCOPY  10/12/2018    No flowsheet data found.

## 2019-02-13 ENCOUNTER — Telehealth: Payer: Self-pay | Admitting: Family Medicine

## 2019-02-13 DIAGNOSIS — S139XXD Sprain of joints and ligaments of unspecified parts of neck, subsequent encounter: Secondary | ICD-10-CM

## 2019-02-13 NOTE — Telephone Encounter (Signed)
Is it okay to place referral? 

## 2019-02-13 NOTE — Telephone Encounter (Signed)
Patient called and would like for Dr. Sarajane Jews to put in a referral for a MRI for shoulder and neck due to its not getting better. Please call patient back, thanks.

## 2019-02-14 NOTE — Telephone Encounter (Signed)
The MRI was ordered

## 2019-02-14 NOTE — Telephone Encounter (Signed)
Pt called in and stated she is have really bad pain in her hip , shoulder and spine.  She feels like it should be order for more then just the cervical ?  She would like to get all this done at once.  Pt stated she is having some issues with taking the prednisone. She would like a nurse to give her a call best number [phone]

## 2019-02-14 NOTE — Telephone Encounter (Signed)
Noted  

## 2019-02-17 NOTE — Telephone Encounter (Signed)
Please advise 

## 2019-02-17 NOTE — Telephone Encounter (Signed)
I ordered the MRI of the cervical spine, but she has already had normal Xrays of the hip and shoulder. Insurance will not cover any other studies ordered by me. If she wishes I can refer her to an Orthopedist to evaluate further

## 2019-02-18 NOTE — Telephone Encounter (Signed)
Left message to return phone call. Pt notified of update via Mychart

## 2019-02-18 NOTE — Telephone Encounter (Signed)
Noted  

## 2019-03-18 ENCOUNTER — Encounter: Payer: Self-pay | Admitting: Neurology

## 2019-03-19 ENCOUNTER — Encounter: Payer: Self-pay | Admitting: Neurology

## 2019-03-19 ENCOUNTER — Ambulatory Visit (INDEPENDENT_AMBULATORY_CARE_PROVIDER_SITE_OTHER): Payer: 59 | Admitting: Neurology

## 2019-03-19 ENCOUNTER — Other Ambulatory Visit: Payer: Self-pay

## 2019-03-19 VITALS — BP 120/80

## 2019-03-19 VITALS — BP 132/96 | HR 83 | Temp 97.9°F | Ht 66.0 in | Wt 170.0 lb

## 2019-03-19 DIAGNOSIS — R29810 Facial weakness: Secondary | ICD-10-CM | POA: Diagnosis not present

## 2019-03-19 DIAGNOSIS — Z0289 Encounter for other administrative examinations: Secondary | ICD-10-CM

## 2019-03-19 DIAGNOSIS — F0781 Postconcussional syndrome: Secondary | ICD-10-CM | POA: Diagnosis not present

## 2019-03-19 DIAGNOSIS — H499 Unspecified paralytic strabismus: Secondary | ICD-10-CM

## 2019-03-19 DIAGNOSIS — G510 Bell's palsy: Secondary | ICD-10-CM | POA: Diagnosis not present

## 2019-03-19 MED ORDER — AMITRIPTYLINE HCL 25 MG PO TABS: 25.0000 mg | ORAL_TABLET | Freq: Every day | ORAL | 3 refills | Status: DC

## 2019-03-19 MED ORDER — METHYLPREDNISOLONE 4 MG PO TBPK: ORAL_TABLET | ORAL | 1 refills | Status: DC

## 2019-03-19 NOTE — Progress Notes (Signed)
From previous note today with Dr. Brett Fairy: BP 132/96   Pulse 83  Temp 97.9 F (36.6 C)  Ht 5\' 6"  (1.676 m)  Wt 170 lb (77.1 kg)  BMI 27.44 kg/m BSA 1.89 m
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
## 2019-03-23 ENCOUNTER — Encounter: Payer: Self-pay | Admitting: Neurology

## 2019-04-03 ENCOUNTER — Telehealth: Payer: Self-pay

## 2019-04-03 NOTE — Telephone Encounter (Signed)
[facility] referral. Patient was in [place] on 02/08/2019. No LOC. No history of head injury. Has been having headaches, word finding and concentration issues. Does intermittently have diplopia. Is in constant pain in the neck and right shoulder region. Has been in physical therapy for past 4 weeks. 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 On schedule next week.

## 2019-04-07 ENCOUNTER — Telehealth: Payer: Self-pay | Admitting: Neurology

## 2019-04-07 NOTE — Telephone Encounter (Signed)
Dr. Rolena Infante referring back for emg/ncs, please schedule thanks

## 2019-04-08 ENCOUNTER — Ambulatory Visit: Payer: 59 | Admitting: Family Medicine

## 2019-04-08 NOTE — Telephone Encounter (Signed)
I left a message on pt's VM req a call back to sched

## 2019-04-11 ENCOUNTER — Other Ambulatory Visit: Payer: Self-pay | Admitting: Neurology

## 2019-04-14 ENCOUNTER — Other Ambulatory Visit: Payer: Self-pay | Admitting: Neurology

## 2019-04-14 ENCOUNTER — Encounter: Payer: Self-pay | Admitting: *Deleted

## 2019-04-14 ENCOUNTER — Telehealth: Payer: Self-pay | Admitting: Neurology

## 2019-04-14 MED ORDER — ALPRAZOLAM 0.25 MG PO TABS: ORAL_TABLET | ORAL | 0 refills | Status: DC

## 2019-04-14 NOTE — Telephone Encounter (Signed)
Xanax sent in

## 2019-04-14 NOTE — Telephone Encounter (Signed)
Noted thanks. Mychart message with xanax info sent to pt.

## 2019-04-14 NOTE — Telephone Encounter (Signed)
MRI scheduled for 04/16/2019.

## 2019-04-14 NOTE — Telephone Encounter (Signed)
Pt called stating that she is needing something called in for her anxiety to get the MRI done. It will need to be called in to the [facility] in [place]

## 2019-04-16 ENCOUNTER — Ambulatory Visit: Payer: 59

## 2019-04-16 ENCOUNTER — Other Ambulatory Visit: Payer: Self-pay

## 2019-04-16 DIAGNOSIS — H499 Unspecified paralytic strabismus: Secondary | ICD-10-CM | POA: Diagnosis not present

## 2019-04-16 DIAGNOSIS — F0781 Postconcussional syndrome: Secondary | ICD-10-CM | POA: Diagnosis not present

## 2019-04-16 DIAGNOSIS — R29810 Facial weakness: Secondary | ICD-10-CM | POA: Diagnosis not present

## 2019-04-16 DIAGNOSIS — G510 Bell's palsy: Secondary | ICD-10-CM

## 2019-04-16 MED ORDER — GADOBENATE DIMEGLUMINE 529 MG/ML IV SOLN
15.0000 mL | Freq: Once | INTRAVENOUS | Status: AC | PRN
  Administered 2019-04-16: 15 mL via INTRAVENOUS

## 2019-04-17 NOTE — Progress Notes (Signed)
MRI of the brain normal, great news, have a wonderful weekend!

## 2019-05-13 ENCOUNTER — Other Ambulatory Visit: Payer: Self-pay | Admitting: Obstetrics & Gynecology

## 2019-05-13 ENCOUNTER — Encounter: Payer: Self-pay | Admitting: Gastroenterology

## 2019-05-13 DIAGNOSIS — Z1231 Encounter for screening mammogram for malignant neoplasm of breast: Secondary | ICD-10-CM

## 2019-05-29 ENCOUNTER — Encounter: Payer: 59 | Admitting: Neurology

## 2019-06-12 ENCOUNTER — Ambulatory Visit (AMBULATORY_SURGERY_CENTER): Payer: Self-pay

## 2019-06-12 ENCOUNTER — Other Ambulatory Visit: Payer: Self-pay

## 2019-06-12 VITALS — Temp 97.1°F | Ht 66.0 in | Wt 173.2 lb

## 2019-06-12 DIAGNOSIS — Z01818 Encounter for other preprocedural examination: Secondary | ICD-10-CM

## 2019-06-12 DIAGNOSIS — Z86010 Personal history of colonic polyps: Secondary | ICD-10-CM

## 2019-06-12 MED ORDER — NA SULFATE-K SULFATE-MG SULF 17.5-3.13-1.6 GM/177ML PO SOLN: 1.0000 | Freq: Once | ORAL | 0 refills | Status: AC

## 2019-06-12 NOTE — Progress Notes (Signed)
No allergies to soy or egg Pt is not on blood thinners or diet pills Denies issues with sedation/intubation Denies atrial flutter/fib Denies constipation   Pt is aware of Covid safety and care partner requirements.      

## 2019-06-16 ENCOUNTER — Ambulatory Visit
Admission: RE | Admit: 2019-06-16 | Discharge: 2019-06-16 | Disposition: A | Discharge status: Home / Self Care | Payer: 59 | Source: Ambulatory Visit | Attending: Obstetrics & Gynecology | Admitting: Obstetrics & Gynecology

## 2019-06-16 ENCOUNTER — Other Ambulatory Visit: Payer: Self-pay

## 2019-06-16 DIAGNOSIS — Z1231 Encounter for screening mammogram for malignant neoplasm of breast: Secondary | ICD-10-CM

## 2019-06-18 ENCOUNTER — Other Ambulatory Visit: Payer: Self-pay | Admitting: Obstetrics & Gynecology

## 2019-06-18 DIAGNOSIS — R928 Other abnormal and inconclusive findings on diagnostic imaging of breast: Secondary | ICD-10-CM

## 2019-06-19 ENCOUNTER — Ambulatory Visit (INDEPENDENT_AMBULATORY_CARE_PROVIDER_SITE_OTHER): Payer: 59 | Admitting: Neurology

## 2019-06-19 ENCOUNTER — Other Ambulatory Visit: Payer: Self-pay

## 2019-06-19 DIAGNOSIS — M545 Low back pain, unspecified: Secondary | ICD-10-CM

## 2019-06-19 DIAGNOSIS — M7918 Myalgia, other site: Secondary | ICD-10-CM

## 2019-06-19 DIAGNOSIS — M5412 Radiculopathy, cervical region: Secondary | ICD-10-CM | POA: Diagnosis not present

## 2019-06-19 DIAGNOSIS — R202 Paresthesia of skin: Secondary | ICD-10-CM

## 2019-06-19 DIAGNOSIS — M5416 Radiculopathy, lumbar region: Secondary | ICD-10-CM | POA: Diagnosis not present

## 2019-06-19 DIAGNOSIS — Z0289 Encounter for other administrative examinations: Secondary | ICD-10-CM

## 2019-06-19 DIAGNOSIS — M62838 Other muscle spasm: Secondary | ICD-10-CM

## 2019-06-19 DIAGNOSIS — M79601 Pain in right arm: Secondary | ICD-10-CM

## 2019-06-19 DIAGNOSIS — G8929 Other chronic pain: Secondary | ICD-10-CM

## 2019-06-19 DIAGNOSIS — R200 Anesthesia of skin: Secondary | ICD-10-CM

## 2019-06-19 DIAGNOSIS — M542 Cervicalgia: Secondary | ICD-10-CM | POA: Diagnosis not present

## 2019-06-19 DIAGNOSIS — M62830 Muscle spasm of back: Secondary | ICD-10-CM

## 2019-06-19 DIAGNOSIS — R519 Headache, unspecified: Secondary | ICD-10-CM

## 2019-06-19 DIAGNOSIS — M79602 Pain in left arm: Secondary | ICD-10-CM

## 2019-06-19 MED ORDER — METHYLPREDNISOLONE ACETATE 80 MG/ML IJ SUSP
160.0000 mg | Freq: Once | INTRAMUSCULAR | Status: AC
  Administered 2019-06-19: 160 mg via INTRAMUSCULAR

## 2019-06-19 NOTE — Progress Notes (Signed)
Injections by Dr. Jaynee Eagles: Bupivacaine 0.5% 5 mL (5 mg/mL) LOT [id number] EXP 04/2020  Lidocaine 2% LOT 26mL (20 mg/mL) 07-083-DK  EXP 09/04/2019  Depo-medrol 160 mg

## 2019-06-19 NOTE — Progress Notes (Signed)
Patient continues with headache mostly occipital. She has been to PT for neck pain and she has had injections, low back is better since the injections as well. Headaches are better as well but still with daily headaches, headaches start in the occipital area, we discussed occipital nerves and neuralgia she also gets tension headaches. Will increase amitriptyline, send to PT dry needling for both cervical and lumbar myofascial syndrome/muscle spasm, today did trigger point injections as well as bilateral occipital nerve blocks(will bill by time not procedure code). Also 4 limb emg was normal, discussed. NCS were normal. Discussed with patient. If not improving we may try a trial of botox with migraine protocol. Discussed RFA on occipital nerves  Orders Placed This Encounter  Procedures  . Ambulatory referral to Physical Therapy  . NCV with EMG(electromyography)   Meds ordered this encounter  Medications  . methylPREDNISolone acetate (DEPO-MEDROL) injection 160 mg  . amitriptyline (ELAVIL) 25 MG tablet    Sig: Take 3 tablets (75 mg total) by mouth at bedtime.    Dispense:  270 tablet    Refill:  1     I spent 30 minutes of face-to-face and non-face-to-face time with patient on the  1. Neck pain   2. Cervical radiculopathy   3. Lumbar radiculopathy   4. Chronic bilateral low back pain, unspecified whether sciatica present   5. Occipital headache   6. Pain in both upper extremities   7. Bilateral numbness and tingling of arms and legs   8. Myofascial pain syndrome of lumbar spine   9. Cervical myofascial pain syndrome   10. Spasm of muscle of lower back   11. Neck muscle spasm    diagnosis.  This included previsit chart review, lab review, study review, order entry, electronic health record documentation, patient education on the different diagnostic and therapeutic options, counseling and coordination of care, risks and benefits of management, compliance, or risk factor reduction. This does  not include time spent on emg/ncs.

## 2019-06-19 NOTE — Progress Notes (Signed)
See procedure note.

## 2019-06-20 ENCOUNTER — Other Ambulatory Visit (HOSPITAL_COMMUNITY)
Admission: RE | Admit: 2019-06-20 | Discharge: 2019-06-20 | Disposition: A | Discharge status: Home / Self Care | Payer: 59 | Source: Ambulatory Visit | Attending: Gastroenterology | Admitting: Gastroenterology

## 2019-06-20 DIAGNOSIS — Z01812 Encounter for preprocedural laboratory examination: Secondary | ICD-10-CM | POA: Diagnosis not present

## 2019-06-20 DIAGNOSIS — Z20822 Contact with and (suspected) exposure to covid-19: Secondary | ICD-10-CM | POA: Diagnosis not present

## 2019-06-20 LAB — SARS CORONAVIRUS 2 (TAT 6-24 HRS): SARS Coronavirus 2: NEGATIVE

## 2019-06-20 MED ORDER — AMITRIPTYLINE HCL 25 MG PO TABS: 75.0000 mg | ORAL_TABLET | Freq: Every day | ORAL | 1 refills | Status: DC
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
## 2019-06-25 ENCOUNTER — Ambulatory Visit (AMBULATORY_SURGERY_CENTER): Payer: 59 | Admitting: Gastroenterology

## 2019-06-25 ENCOUNTER — Encounter: Payer: Self-pay | Admitting: Gastroenterology

## 2019-06-25 ENCOUNTER — Other Ambulatory Visit: Payer: Self-pay

## 2019-06-25 VITALS — BP 116/68 | HR 73 | Temp 97.1°F | Resp 15 | Ht 66.0 in | Wt 173.0 lb

## 2019-06-25 DIAGNOSIS — D123 Benign neoplasm of transverse colon: Secondary | ICD-10-CM | POA: Diagnosis not present

## 2019-06-25 DIAGNOSIS — D125 Benign neoplasm of sigmoid colon: Secondary | ICD-10-CM | POA: Diagnosis not present

## 2019-06-25 DIAGNOSIS — Z86010 Personal history of colonic polyps: Secondary | ICD-10-CM | POA: Diagnosis not present

## 2019-06-25 HISTORY — PX: COLONOSCOPY: SHX174

## 2019-06-25 MED ORDER — SODIUM CHLORIDE 0.9 % IV SOLN: 500.0000 mL | Freq: Once | INTRAVENOUS | Status: DC

## 2019-06-25 NOTE — Progress Notes (Signed)
Pt's states no medical or surgical changes since previsit or office visit.  LC - temp DT - vitals 

## 2019-06-25 NOTE — Op Note (Signed)
[place] Patient Name: [name] Procedure Date: 06/25/2019 11:25 AM MRN: [id number] Endoscopist: Remo Lipps P. Havery Moros , MD Age: 49 Referring MD:  Date of Birth: 1971/02/24 Gender: Female Account #: [id number] Procedure:                Colonoscopy Indications:              High risk colon cancer surveillance: Personal                            history of colonic polyps - 7 polyps removed in 2017 Medicines:                Monitored Anesthesia Care Procedure:                Pre-Anesthesia Assessment:                           - Prior to the procedure, a History and Physical                            was performed, and patient medications and                            allergies were reviewed. The patient's tolerance of                            previous anesthesia was also reviewed. The risks                            and benefits of the procedure and the sedation                            options and risks were discussed with the patient.                            All questions were answered, and informed consent                            was obtained. Prior Anticoagulants: The patient has                            taken no previous anticoagulant or antiplatelet                            agents. ASA Grade Assessment: II - A patient with                            mild systemic disease. After reviewing the risks                            and benefits, the patient was deemed in                            satisfactory condition to undergo the procedure.  After obtaining informed consent, the colonoscope                            was passed under direct vision. Throughout the                            procedure, the patient's blood pressure, pulse, and                            oxygen saturations were monitored continuously. The                            Colonoscope was introduced through the anus and   advanced to the the cecum, identified by                            appendiceal orifice and ileocecal valve. The                            colonoscopy was technically difficult and complex                            due to significant looping. The patient tolerated                            the procedure well. The quality of the bowel                            preparation was good. The ileocecal valve,                            appendiceal orifice, and rectum were photographed. Scope In: 11:29:27 AM Scope Out: 12:01:27 PM Scope Withdrawal Time: 0 hours 22 minutes 27 seconds  Total Procedure Duration: 0 hours 32 minutes 0 seconds  Findings:                 The perianal and digital rectal examinations were                            normal.                           A single small angiodysplastic lesion was found in                            the cecum.                           A 3 mm polyp was found in the hepatic flexure. The                            polyp was sessile. The polyp was removed with a                            cold snare. Resection and retrieval were  complete.                           Two sessile polyps were found in the sigmoid colon.                            The polyps were 3 to 4 mm in size. These polyps                            were removed with a cold snare. Resection and                            retrieval were complete.                           A few small-mouthed diverticula were found in the                            sigmoid colon.                           The colon was extremely tortuous, cecal intubation                            was challenging.                           The exam was otherwise without abnormality. Complications:            No immediate complications. Estimated blood loss:                            Minimal. Estimated Blood Loss:     Estimated blood loss was minimal. Impression:               - A single colonic angiodysplastic  lesion.                           - One 3 mm polyp at the hepatic flexure, removed                            with a cold snare. Resected and retrieved.                           - Two 3 to 4 mm polyps in the sigmoid colon,                            removed with a cold snare. Resected and retrieved.                           - Diverticulosis in the sigmoid colon.                           - Tortuous colon.                           -  The examination was otherwise normal. Recommendation:           - Patient has a contact number available for                            emergencies. The signs and symptoms of potential                            delayed complications were discussed with the                            patient. Return to normal activities tomorrow.                            Written discharge instructions were provided to the                            patient.                           - Resume previous diet.                           - Continue present medications.                           - Await pathology results. Remo Lipps P. Chenelle Benning, MD 06/25/2019 [id number] PM This report has been signed electronically.

## 2019-06-25 NOTE — Patient Instructions (Addendum)
Information on polyps and diverticulosis given to you today.  Await pathology results.    YOU HAD AN ENDOSCOPIC PROCEDURE TODAY AT [facility]:   Refer to the procedure report that was given to you for any specific questions about what was found during the examination.  If the procedure report does not answer your questions, please call your gastroenterologist to clarify.  If you requested that your care partner not be given the details of your procedure findings, then the procedure report has been included in a sealed envelope for you to review at your convenience later.  YOU SHOULD EXPECT: Some feelings of bloating in the abdomen. Passage of more gas than usual.  Walking can help get rid of the air that was put into your GI tract during the procedure and reduce the bloating. If you had a lower endoscopy (such as a colonoscopy or flexible sigmoidoscopy) you may notice spotting of blood in your stool or on the toilet paper. If you underwent a bowel prep for your procedure, you may not have a normal bowel movement for a few days.  Please Note:  You might notice some irritation and congestion in your nose or some drainage.  This is from the oxygen used during your procedure.  There is no need for concern and it should clear up in a day or so.  SYMPTOMS TO REPORT IMMEDIATELY:   Following lower endoscopy (colonoscopy or flexible sigmoidoscopy):  Excessive amounts of blood in the stool  Significant tenderness or worsening of abdominal pains  Swelling of the abdomen that is new, acute  Fever of 100F or higher   For urgent or emergent issues, a gastroenterologist can be reached at any hour by calling [phone]. Do not use MyChart messaging for urgent concerns.    DIET:  We do recommend a small meal at first, but then you may proceed to your regular diet.  Drink plenty of fluids but you should avoid alcoholic beverages for 24 hours.  ACTIVITY:  You should plan to take it  easy for the rest of today and you should NOT DRIVE or use heavy machinery until tomorrow (because of the sedation medicines used during the test).    FOLLOW UP: Our staff will call the number listed on your records 48-72 hours following your procedure to check on you and address any questions or concerns that you may have regarding the information given to you following your procedure. If we do not reach you, we will leave a message.  We will attempt to reach you two times.  During this call, we will ask if you have developed any symptoms of COVID 19. If you develop any symptoms (ie: fever, flu-like symptoms, shortness of breath, cough etc.) before then, please call [phone].  If you test positive for Covid 19 in the 2 weeks post procedure, please call and report this information to [place].    If any biopsies were taken you will be contacted by phone or by letter within the next 1-3 weeks.  Please call us at [phone] if you have not heard about the biopsies in 3 weeks.    SIGNATURES/CONFIDENTIALITY: You and/or your care partner have signed paperwork which will be entered into your electronic medical record.  These signatures attest to the fact that that the information above on your After Visit Summary has been reviewed and is understood.  Full responsibility of the confidentiality of this discharge information lies with you and/or your care-partner.

## 2019-06-25 NOTE — Progress Notes (Signed)
Called to room to assist during endoscopic procedure.  Patient ID and intended procedure confirmed with present staff. Received instructions for my participation in the procedure from the performing physician.  

## 2019-06-25 NOTE — Progress Notes (Signed)
PT taken to PACU. Monitors in place. VSS. Report given to RN. 

## 2019-06-27 ENCOUNTER — Telehealth: Payer: Self-pay

## 2019-06-27 NOTE — Telephone Encounter (Signed)
  Follow up Call-  Call back number 06/25/2019  Post procedure Call Back phone  # [phone]  Permission to leave phone message Yes  Some recent data might be hidden     Patient questions:  Do you have a fever, pain , or abdominal swelling? No. Pain Score  0 *  Have you tolerated food without any problems? Yes.    Have you been able to return to your normal activities? Yes.    Do you have any questions about your discharge instructions: Diet   No. Medications  No. Follow up visit  No.  Do you have questions or concerns about your Care? No.  Actions: * If pain score is 4 or above: No action needed, pain <4. 1. Have you developed a fever since your procedure? no  2.   Have you had an respiratory symptoms (SOB or cough) since your procedure? no  3.   Have you tested positive for COVID 19 since your procedure no  4.   Have you had any family members/close contacts diagnosed with the COVID 19 since your procedure?  no   If yes to any of these questions please route to Joylene John, RN and Erenest Rasher, RN

## 2019-06-27 NOTE — Telephone Encounter (Signed)
First attempt follow up call to pt, lm on vm 

## 2019-07-01 ENCOUNTER — Ambulatory Visit: Payer: 59

## 2019-07-01 ENCOUNTER — Ambulatory Visit
Admission: RE | Admit: 2019-07-01 | Discharge: 2019-07-01 | Disposition: A | Discharge status: Home / Self Care | Payer: 59 | Source: Ambulatory Visit | Attending: Obstetrics & Gynecology | Admitting: Obstetrics & Gynecology

## 2019-07-01 ENCOUNTER — Other Ambulatory Visit: Payer: Self-pay

## 2019-07-01 DIAGNOSIS — R928 Other abnormal and inconclusive findings on diagnostic imaging of breast: Secondary | ICD-10-CM

## 2019-07-02 ENCOUNTER — Other Ambulatory Visit: Payer: Self-pay

## 2019-07-03 ENCOUNTER — Encounter: Payer: Self-pay | Admitting: Obstetrics & Gynecology

## 2019-07-03 ENCOUNTER — Ambulatory Visit (INDEPENDENT_AMBULATORY_CARE_PROVIDER_SITE_OTHER): Payer: 59 | Admitting: Obstetrics & Gynecology

## 2019-07-03 VITALS — BP 126/80 | Ht 65.0 in | Wt 173.0 lb

## 2019-07-03 DIAGNOSIS — D219 Benign neoplasm of connective and other soft tissue, unspecified: Secondary | ICD-10-CM | POA: Diagnosis not present

## 2019-07-03 DIAGNOSIS — D649 Anemia, unspecified: Secondary | ICD-10-CM

## 2019-07-03 DIAGNOSIS — Z86711 Personal history of pulmonary embolism: Secondary | ICD-10-CM

## 2019-07-03 DIAGNOSIS — Z01419 Encounter for gynecological examination (general) (routine) without abnormal findings: Secondary | ICD-10-CM | POA: Diagnosis not present

## 2019-07-03 DIAGNOSIS — Z9851 Tubal ligation status: Secondary | ICD-10-CM

## 2019-07-03 DIAGNOSIS — N920 Excessive and frequent menstruation with regular cycle: Secondary | ICD-10-CM | POA: Diagnosis not present

## 2019-07-03 LAB — CBC
HCT: 30.5 % — ABNORMAL LOW (ref 35.0–45.0)
Hemoglobin: 8.4 g/dL — ABNORMAL LOW (ref 11.7–15.5)
MCH: 19 pg — ABNORMAL LOW (ref 27.0–33.0)
MCHC: 27.5 g/dL — ABNORMAL LOW (ref 32.0–36.0)
MCV: 69.2 fL — ABNORMAL LOW (ref 80.0–100.0)
MPV: 9.2 fL (ref 7.5–12.5)
Platelets: 507 10*3/uL — ABNORMAL HIGH (ref 140–400)
RBC: 4.41 10*6/uL (ref 3.80–5.10)
RDW: 16 % — ABNORMAL HIGH (ref 11.0–15.0)
WBC: 4.9 10*3/uL (ref 3.8–10.8)

## 2019-07-03 LAB — TSH: TSH: 1.3 mIU/L

## 2019-07-03 LAB — VITAMIN D 25 HYDROXY (VIT D DEFICIENCY, FRACTURES): Vit D, 25-Hydroxy: 11 ng/mL — ABNORMAL LOW (ref 30–100)
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
## 2019-07-04 ENCOUNTER — Encounter: Payer: Self-pay | Admitting: Obstetrics & Gynecology

## 2019-07-04 NOTE — Patient Instructions (Signed)
1. Encounter for routine gynecological examination with Papanicolaou smear of cervix Gynecologic exam with fibroid uterus.  Pap reflex done.  Breast exam normal.  Screening mammogram April 2021 Negative.  Colonoscopy April 2021.  Health labs with family physician and completing here today.  Body mass index 28.79.  Healthy nutrition. - VITAMIN D 25 Hydroxy (Vit-D Deficiency, Fractures)  2. S/P tubal ligation  3. Menorrhagia with regular cycle Menorrhagia secondary to uterine fibroids.  Will check a CBC today and a TSH.  Follow-up for a repeat pelvic ultrasound.  We will proceed with an exercise robotic total laparoscopic hysterectomy with bilateral salpingectomy. - CBC - TSH - US Transvaginal Non-OB; Future  4. Secondary anemia History of secondary anemia.  Will recheck hemoglobin today.  May need IV iron. - CBC  5. Fibroids Reassessment of fibroids prior to surgery with pelvic ultrasound. - US Transvaginal Non-OB; Future  6. Hx pulmonary embolism  [name], it was a pleasure seeing you today!  I will inform you of your results as soon as they are available.

## 2019-07-07 ENCOUNTER — Telehealth: Payer: Self-pay | Admitting: *Deleted

## 2019-07-07 ENCOUNTER — Other Ambulatory Visit: Payer: Self-pay | Admitting: *Deleted

## 2019-07-07 DIAGNOSIS — E559 Vitamin D deficiency, unspecified: Secondary | ICD-10-CM

## 2019-07-07 DIAGNOSIS — D649 Anemia, unspecified: Secondary | ICD-10-CM

## 2019-07-07 MED ORDER — VITAMIN D (ERGOCALCIFEROL) 1.25 MG (50000 UNIT) PO CAPS: 50000.0000 [IU] | ORAL_CAPSULE | ORAL | 0 refills | Status: DC

## 2019-07-07 NOTE — Telephone Encounter (Signed)
Hb 8.4. Please organize IV Iron with Hemato.   I called [place] [employer] and was informed to call [employer] for prior approval for CPT code [phone] . authorization pending for clinical review, informed to fax office notes and labs results to [id number]. Notes faxed, will wait for response.

## 2019-07-08 LAB — PAP IG W/ RFLX HPV ASCU

## 2019-07-08 LAB — HUMAN PAPILLOMAVIRUS, HIGH RISK: HPV DNA High Risk: DETECTED — AB

## 2019-07-08 NOTE — Telephone Encounter (Signed)
[place] [employer] drug policy sent a response back stating preferred products and it is required at least 2 of those products to be used for 3 weeks each before feraheme infusion can be done. The preferred products are  Ferriecit (sodium ferric gluconate complex) or Venofer (iron sucrose) or Infed (iron dextran).   Please advise

## 2019-07-11 NOTE — Telephone Encounter (Addendum)
Patient informed with below, I called short stay to discuss the below and was told to fax the order with the dose and directions for Ferrlecit  IV infusion.   Dr.Lavoie I am not sure what the dosage for the Ferrlecit IV infusion. Please advise

## 2019-07-11 NOTE — Telephone Encounter (Signed)
Please share that info with patient and start with Ferriecit.

## 2019-07-11 NOTE — Telephone Encounter (Signed)
Dosage reviewed with Juliann Pulse who will send orders.  Ferrlecit 2 vials of 62.5 mg/5 mL (125 mg) diluted in 100 mL of 0.9% saline IV over 1 hour.  Check CBC 1 week later.

## 2019-07-14 NOTE — Telephone Encounter (Signed)
Patient scheduled at short stay on 07/17/19 order faxed. Patient informed with time and date.

## 2019-07-17 ENCOUNTER — Encounter (HOSPITAL_COMMUNITY): Payer: 59

## 2019-07-25 ENCOUNTER — Other Ambulatory Visit (HOSPITAL_COMMUNITY): Payer: Self-pay | Admitting: *Deleted

## 2019-07-28 ENCOUNTER — Ambulatory Visit (HOSPITAL_COMMUNITY)
Admission: RE | Admit: 2019-07-28 | Discharge: 2019-07-28 | Disposition: A | Discharge status: Home / Self Care | Payer: 59 | Source: Ambulatory Visit | Attending: Family Medicine | Admitting: Family Medicine

## 2019-07-28 ENCOUNTER — Other Ambulatory Visit: Payer: Self-pay

## 2019-07-28 DIAGNOSIS — D649 Anemia, unspecified: Secondary | ICD-10-CM | POA: Insufficient documentation

## 2019-07-28 MED ORDER — SODIUM CHLORIDE 0.9 % IV SOLN
125.0000 mg | Freq: Once | INTRAVENOUS | Status: AC
  Administered 2019-07-28: 125 mg via INTRAVENOUS
  Filled 2019-07-28: qty 10

## 2019-07-28 NOTE — Discharge Instructions (Signed)
Sodium Ferric Gluconate Complex injection What is this medicine? SODIUM FERRIC GLUCONATE COMPLEX (SOE dee um FER ik GLOO koe nate KOM pleks) is an iron replacement. It is used with epoetin therapy to treat low iron levels in patients who are receiving hemodialysis. This medicine may be used for other purposes; ask your health care provider or pharmacist if you have questions. COMMON BRAND NAME(S): Ferrlecit, Nulecit What should I tell my health care provider before I take this medicine? They need to know if you have any of the following conditions:  anemia that is not from iron deficiency  high levels of iron in the body  an unusual or allergic reaction to iron, benzyl alcohol, other medicines, foods, dyes, or preservatives  pregnant or are trying to become pregnant  breast-feeding How should I use this medicine? This medicine is for infusion into a vein. It is given by a health care professional in a hospital or clinic setting. Talk to your pediatrician regarding the use of this medicine in children. While this drug may be prescribed for children as young as 6 years old for selected conditions, precautions do apply. Overdosage: If you think you have taken too much of this medicine contact a poison control center or emergency room at once. NOTE: This medicine is only for you. Do not share this medicine with others. What if I miss a dose? It is important not to miss your dose. Call your doctor or health care professional if you are unable to keep an appointment. What may interact with this medicine? Do not take this medicine with any of the following medications:  deferoxamine  dimercaprol  other iron products This medicine may also interact with the following medications:  chloramphenicol  deferasirox  medicine for blood pressure like enalapril This list may not describe all possible interactions. Give your health care provider a list of all the medicines, herbs,  non-prescription drugs, or dietary supplements you use. Also tell them if you smoke, drink alcohol, or use illegal drugs. Some items may interact with your medicine. What should I watch for while using this medicine? Your condition will be monitored carefully while you are receiving this medicine. Visit your doctor for check-ups as directed. What side effects may I notice from receiving this medicine? Side effects that you should report to your doctor or health care professional as soon as possible:  allergic reactions like skin rash, itching or hives, swelling of the face, lips, or tongue  breathing problems  changes in hearing  changes in vision  chills, flushing, or sweating  fast, irregular heartbeat  feeling faint or lightheaded, falls  fever, flu-like symptoms  high or low blood pressure  pain, tingling, numbness in the hands or feet  severe pain in the chest, back, flanks, or groin  swelling of the ankles, feet, hands  trouble passing urine or change in the amount of urine  unusually weak or tired Side effects that usually do not require medical attention (report to your doctor or health care professional if they continue or are bothersome):  cramps  dark colored stools  diarrhea  headache  nausea, vomiting  stomach upset This list may not describe all possible side effects. Call your doctor for medical advice about side effects. You may report side effects to FDA at 1-800-FDA-1088. Where should I keep my medicine? This drug is given in a hospital or clinic and will not be stored at home. NOTE: This sheet is a summary. It may not cover all   possible information. If you have questions about this medicine, talk to your doctor, pharmacist, or health care provider.  2020 Elsevier/Gold Standard (2007-10-23 15:58:57)  

## 2019-07-29 NOTE — Telephone Encounter (Signed)
I called patient to confirm I told her to recheck CBC in 1 week. Patient was not informed on original conversation,patient aware to have rechecked, she rescheduled her iron infusion to 07/28/19. Order placed for lab

## 2019-08-08 ENCOUNTER — Other Ambulatory Visit: Payer: 59

## 2019-08-11 ENCOUNTER — Other Ambulatory Visit: Payer: Self-pay

## 2019-08-11 ENCOUNTER — Other Ambulatory Visit: Payer: 59

## 2019-08-11 DIAGNOSIS — D649 Anemia, unspecified: Secondary | ICD-10-CM

## 2019-08-11 LAB — CBC
HCT: 31.3 % — ABNORMAL LOW (ref 35.0–45.0)
Hemoglobin: 8.9 g/dL — ABNORMAL LOW (ref 11.7–15.5)
MCH: 19.9 pg — ABNORMAL LOW (ref 27.0–33.0)
MCHC: 28.4 g/dL — ABNORMAL LOW (ref 32.0–36.0)
MCV: 69.9 fL — ABNORMAL LOW (ref 80.0–100.0)
MPV: 9.2 fL (ref 7.5–12.5)
Platelets: 419 10*3/uL — ABNORMAL HIGH (ref 140–400)
RBC: 4.48 10*6/uL (ref 3.80–5.10)
RDW: 18.1 % — ABNORMAL HIGH (ref 11.0–15.0)
WBC: 4.5 10*3/uL (ref 3.8–10.8)

## 2019-08-13 ENCOUNTER — Other Ambulatory Visit: Payer: Self-pay

## 2019-08-14 ENCOUNTER — Ambulatory Visit (INDEPENDENT_AMBULATORY_CARE_PROVIDER_SITE_OTHER): Payer: 59

## 2019-08-14 ENCOUNTER — Ambulatory Visit (INDEPENDENT_AMBULATORY_CARE_PROVIDER_SITE_OTHER): Payer: 59 | Admitting: Obstetrics & Gynecology

## 2019-08-14 DIAGNOSIS — N920 Excessive and frequent menstruation with regular cycle: Secondary | ICD-10-CM

## 2019-08-14 DIAGNOSIS — Z86711 Personal history of pulmonary embolism: Secondary | ICD-10-CM

## 2019-08-14 DIAGNOSIS — D649 Anemia, unspecified: Secondary | ICD-10-CM

## 2019-08-14 DIAGNOSIS — Z9851 Tubal ligation status: Secondary | ICD-10-CM

## 2019-08-14 DIAGNOSIS — D219 Benign neoplasm of connective and other soft tissue, unspecified: Secondary | ICD-10-CM

## 2019-08-14 NOTE — Progress Notes (Signed)
    JESSEY HUYETT 08-29-1970 [id number]        49 y.o.  [id number] Married  RP: Symptomatic fibroids/menorrhagia/severe anemia for Pelvic US  HPI: Uterine fibroids with severe menorrhagia and secondary anemia.  Received Ferriecit IV iron incision on Jul 28, 2019.  Hemoglobin was at 8.9 on 6/7th.  Still feeling tired and easily short of breath.  Very busy with children activities.   OB History  Gravida Para Term Preterm AB Living  4 3     1 3   SAB TAB Ectopic Multiple Live Births               # Outcome Date GA Lbr Len/2nd Weight Sex Delivery Anes PTL Lv  4 AB           3 Para           2 Para           1 Para             Past medical history,surgical history, problem list, medications, allergies, family history and social history were all reviewed and documented in the EPIC chart.   Directed ROS with pertinent positives and negatives documented in the history of present illness/assessment and plan.  Exam:  There were no vitals filed for this visit. General appearance:  Normal  Pelvic US today: Comparison is made with previous scan in 2019.  T/V images.  Anteverted enlarged uterus with multiple intramural and subserosal fibroids with no significant change in size of the uterus or fibroids since previous scan.  The uterus is measured at 11.62 x 8.55 x 7.78 cm.  The endometrial lining is normal and thin measured at 5.2 mm.  The cavity is distorted by adjacent fibroids.  No mass or thickening of the endometrium is seen.  Both ovaries are normal in size with normal follicular pattern.  No adnexal mass.  No free fluid in the posterior cul-de-sac.   Assessment/Plan:  49 y.o. [id number]   1. Menorrhagia with regular cycle Severe menorrhagia associated with uterine fibroids with secondary anemia.  Will repeat a Ferriecit IV infusion.  Acessa procedure for destruction of fibroids with ultrasound energy discussed.  Patient prefers a definitive surgery with hysterectomy.  Planning XI Robotic  TLH/Bilateral Salpingectomy, patient strongly encouraged to proceed sooner than later.  Mirena IUD in meantime recommended.  Will f/u for Mirena IUD insertion.  2. Secondary anemia Schedule 2nd Iron infusion.    3. Fibroids Pelvic US shows a stable uterine size and fibroids.  Normal ovaries.  Pelvic ultrasound findings thoroughly reviewed.  4. S/P tubal ligation  5. Hx pulmonary embolism Contraindication to combined estrogen progestin therapy.  Princess Bruins MD, 1:07 PM 08/14/2019

## 2019-08-17 ENCOUNTER — Encounter: Payer: Self-pay | Admitting: Obstetrics & Gynecology

## 2019-08-17 NOTE — Patient Instructions (Signed)
1. Menorrhagia with regular cycle Severe menorrhagia associated with uterine fibroids with secondary anemia.  Will repeat a Ferriecit IV infusion.  Acessa procedure for destruction of fibroids with ultrasound energy discussed.  Patient prefers a definitive surgery with hysterectomy.  Planning XI Robotic TLH/Bilateral Salpingectomy, patient strongly encouraged to proceed sooner than later.  Mirena IUD in meantime recommended.  Will f/u for Mirena IUD insertion.  2. Secondary anemia Schedule 2nd Iron infusion.    3. Fibroids Pelvic US shows a stable uterine size and fibroids.  Normal ovaries.  Pelvic ultrasound findings thoroughly reviewed.  4. S/P tubal ligation  5. Hx pulmonary embolism Contraindication to combined estrogen progestin therapy.  [name], it was a pleasure seeing you today!

## 2019-08-19 ENCOUNTER — Telehealth: Payer: Self-pay | Admitting: *Deleted

## 2019-08-19 ENCOUNTER — Ambulatory Visit: Payer: 59 | Attending: Neurology | Admitting: Physical Therapy

## 2019-08-19 ENCOUNTER — Encounter: Payer: Self-pay | Admitting: Physical Therapy

## 2019-08-19 DIAGNOSIS — M542 Cervicalgia: Secondary | ICD-10-CM | POA: Insufficient documentation

## 2019-08-19 DIAGNOSIS — D649 Anemia, unspecified: Secondary | ICD-10-CM

## 2019-08-19 NOTE — Telephone Encounter (Signed)
-----   Message from Princess Bruins, MD sent at 08/14/2019 12:58 PM EDT ----- Regarding: Schedule 2nd Iron Infusion Ferric gluconate (NULECIT) 125 mg in sodium chloride 0.9 % 100 mL IVPB.  Recheck CBC 1 week after infusion.

## 2019-08-19 NOTE — Telephone Encounter (Signed)
Patient scheduled 08/26/19 at 1:00pm at [facility] short stay. Patient informed. Order placed for repeat CBC.

## 2019-08-19 NOTE — Therapy (Signed)
Pt arrived for her evaluation for neck pain. She is currently receiving PT at another location for the same diagnosis. PT encouraged pt to reach out to insurance regarding whether or not this is allowed. She would like to hold off on her evaluation and reach out to her referring physician and insurance before beginning PT at our location.

## 2019-08-26 ENCOUNTER — Inpatient Hospital Stay (HOSPITAL_COMMUNITY): Admission: RE | Admit: 2019-08-26 | Payer: 59 | Source: Ambulatory Visit

## 2019-08-26 ENCOUNTER — Encounter (HOSPITAL_COMMUNITY): Payer: 59

## 2019-09-05 ENCOUNTER — Other Ambulatory Visit: Payer: Self-pay | Admitting: Obstetrics & Gynecology

## 2019-09-12 ENCOUNTER — Encounter: Payer: 59 | Admitting: Obstetrics & Gynecology

## 2019-09-17 ENCOUNTER — Other Ambulatory Visit (HOSPITAL_COMMUNITY): Payer: Self-pay | Admitting: *Deleted

## 2019-09-17 ENCOUNTER — Ambulatory Visit (INDEPENDENT_AMBULATORY_CARE_PROVIDER_SITE_OTHER): Payer: 59 | Admitting: Family Medicine

## 2019-09-17 ENCOUNTER — Other Ambulatory Visit: Payer: Self-pay

## 2019-09-17 ENCOUNTER — Encounter: Payer: Self-pay | Admitting: Family Medicine

## 2019-09-17 VITALS — BP 130/70 | HR 90 | Temp 97.8°F | Wt 178.0 lb

## 2019-09-17 DIAGNOSIS — J432 Centrilobular emphysema: Secondary | ICD-10-CM

## 2019-09-17 DIAGNOSIS — J440 Chronic obstructive pulmonary disease with acute lower respiratory infection: Secondary | ICD-10-CM | POA: Diagnosis not present

## 2019-09-17 DIAGNOSIS — J209 Acute bronchitis, unspecified: Secondary | ICD-10-CM

## 2019-09-17 DIAGNOSIS — Z0000 Encounter for general adult medical examination without abnormal findings: Secondary | ICD-10-CM

## 2019-09-17 DIAGNOSIS — D509 Iron deficiency anemia, unspecified: Secondary | ICD-10-CM
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
## 2019-09-18 ENCOUNTER — Ambulatory Visit (HOSPITAL_COMMUNITY)
Admission: RE | Admit: 2019-09-18 | Discharge: 2019-09-18 | Disposition: A | Discharge status: Home / Self Care | Payer: 59 | Source: Ambulatory Visit | Attending: Obstetrics & Gynecology | Admitting: Obstetrics & Gynecology

## 2019-09-18 DIAGNOSIS — D649 Anemia, unspecified: Secondary | ICD-10-CM | POA: Diagnosis not present

## 2019-09-18 LAB — HEPATIC FUNCTION PANEL
AG Ratio: 1.7 (calc) (ref 1.0–2.5)
ALT: 13 U/L (ref 6–29)
AST: 13 U/L (ref 10–35)
Albumin: 4 g/dL (ref 3.6–5.1)
Alkaline phosphatase (APISO): 43 U/L (ref 31–125)
Bilirubin, Direct: 0.1 mg/dL (ref 0.0–0.2)
Globulin: 2.4 g/dL (calc) (ref 1.9–3.7)
Indirect Bilirubin: 0.1 mg/dL (calc) — ABNORMAL LOW (ref 0.2–1.2)
Total Bilirubin: 0.2 mg/dL (ref 0.2–1.2)
Total Protein: 6.4 g/dL (ref 6.1–8.1)

## 2019-09-18 LAB — BASIC METABOLIC PANEL
BUN: 11 mg/dL (ref 7–25)
CO2: 25 mmol/L (ref 20–32)
Calcium: 9.1 mg/dL (ref 8.6–10.2)
Chloride: 106 mmol/L (ref 98–110)
Creat: 0.64 mg/dL (ref 0.50–1.10)
Glucose, Bld: 82 mg/dL (ref 65–99)
Potassium: 3.9 mmol/L (ref 3.5–5.3)
Sodium: 138 mmol/L (ref 135–146)

## 2019-09-18 LAB — VITAMIN B12: Vitamin B-12: 264 pg/mL (ref 200–1100)

## 2019-09-18 LAB — LIPID PANEL
Cholesterol: 191 mg/dL (ref <200)
HDL: 88 mg/dL (ref >=50)
LDL Cholesterol (Calc): 89 mg/dL (calc)
Non-HDL Cholesterol (Calc): 103 mg/dL (calc) (ref <130)
Total CHOL/HDL Ratio: 2.2 (calc) (ref <5.0)
Triglycerides: 49 mg/dL (ref <150)

## 2019-09-18 LAB — EXTRA LAV TOP TUBE

## 2019-09-18 LAB — T3, FREE: T3, Free: 2.4 pg/mL (ref 2.3–4.2)

## 2019-09-18 LAB — T4, FREE: Free T4: 0.8 ng/dL (ref 0.8–1.8)

## 2019-09-18 LAB — IRON,TIBC AND FERRITIN PANEL
%SAT: 2 % (calc) — ABNORMAL LOW (ref 16–45)
Ferritin: 1 ng/mL — ABNORMAL LOW (ref 16–232)
Iron: 10 ug/dL — ABNORMAL LOW (ref 40–190)
TIBC: 439 mcg/dL (calc) (ref 250–450)

## 2019-09-18 LAB — TSH: TSH: 1.12 mIU/L

## 2019-09-18 MED ORDER — SODIUM CHLORIDE 0.9 % IV SOLN
125.0000 mg | Freq: Once | INTRAVENOUS | Status: AC
  Administered 2019-09-18: 125 mg via INTRAVENOUS
  Filled 2019-09-18: qty 10

## 2019-09-19 ENCOUNTER — Encounter: Payer: Self-pay | Admitting: Family Medicine

## 2019-09-19 NOTE — Telephone Encounter (Signed)
No I don't think any of these are causing her symptoms, but I would suggest she take a 1000 mcg B12 tablet under the tongue every day

## 2019-09-26 NOTE — Addendum Note (Signed)
Addended by: Alysia Penna A on: 09/26/2019 08:07 AM   Modules accepted: Orders
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
## 2019-10-01 ENCOUNTER — Ambulatory Visit (INDEPENDENT_AMBULATORY_CARE_PROVIDER_SITE_OTHER): Payer: 59 | Admitting: Neurology

## 2019-10-01 ENCOUNTER — Encounter: Payer: Self-pay | Admitting: Neurology

## 2019-10-01 VITALS — BP 117/75 | HR 88 | Ht 66.0 in | Wt 179.0 lb

## 2019-10-01 DIAGNOSIS — G43711 Chronic migraine without aura, intractable, with status migrainosus: Secondary | ICD-10-CM

## 2019-10-01 DIAGNOSIS — F0781 Postconcussional syndrome: Secondary | ICD-10-CM | POA: Insufficient documentation

## 2019-10-01 MED ORDER — AJOVY 225 MG/1.5ML ~~LOC~~ SOAJ: 225.0000 mg | SUBCUTANEOUS | 11 refills | Status: DC

## 2019-10-01 MED ORDER — NURTEC 75 MG PO TBDP: 75.0000 mg | ORAL_TABLET | ORAL | 6 refills | Status: DC

## 2019-10-01 MED ORDER — TRAZODONE HCL 50 MG PO TABS: 50.0000 mg | ORAL_TABLET | Freq: Every day | ORAL | 3 refills | Status: DC

## 2019-10-01 NOTE — Patient Instructions (Addendum)
1, Stop Amitriptyline 2. Try Trazodone at bedtime 3. Start Ajovy preventatively and Nurtec for headaches and migraines. Consider botox next 4. Patient say Dr. Sima [name] in her 43s for therapy and biofeedback and she was very happy with him and feels comfortable so referring for formal memory testing, therapy and biofeedback.and prefer Dr. Sima [name].  Rimegepant oral dissolving tablet What is this medicine? RIMEGEPANT (ri ME je pant) is used to treat migraine headaches with or without aura. An aura is a strange feeling or visual disturbance that warns you of an attack. It is not used to prevent migraines. This medicine may be used for other purposes; ask your health care provider or pharmacist if you have questions. COMMON BRAND NAME(S): NURTEC ODT What should I tell my health care provider before I take this medicine? They need to know if you have any of these conditions:  kidney disease  liver disease  an unusual or allergic reaction to rimegepant, other medicines, foods, dyes, or preservatives  pregnant or trying to get pregnant  breast-feeding How should I use this medicine? Take the medicine by mouth. Follow the directions on the prescription label. Leave the tablet in the sealed blister pack until you are ready to take it. With dry hands, open the blister and gently remove the tablet. If the tablet breaks or crumbles, throw it away and take a new tablet out of the blister pack. Place the tablet in the mouth and allow it to dissolve, and then swallow. Do not cut, crush, or chew this medicine. You do not need water to take this medicine. Talk to your pediatrician about the use of this medicine in children. Special care may be needed. Overdosage: If you think you have taken too much of this medicine contact a poison control center or emergency room at once. NOTE: This medicine is only for you. Do not share this medicine with others. What if I miss a dose? This does not apply. This  medicine is not for regular use. What may interact with this medicine? This medicine may interact with the following medications:  certain medicines for fungal infections like fluconazole, itraconazole  rifampin This list may not describe all possible interactions. Give your health care provider a list of all the medicines, herbs, non-prescription drugs, or dietary supplements you use. Also tell them if you smoke, drink alcohol, or use illegal drugs. Some items may interact with your medicine. What should I watch for while using this medicine? Visit your health care professional for regular checks on your progress. Tell your health care professional if your symptoms do not start to get better or if they get worse. What side effects may I notice from receiving this medicine? Side effects that you should report to your doctor or health care professional as soon as possible:  allergic reactions like skin rash, itching or hives; swelling of the face, lips, or tongue Side effects that usually do not require medical attention (report these to your doctor or health care professional if they continue or are bothersome):  nausea This list may not describe all possible side effects. Call your doctor for medical advice about side effects. You may report side effects to FDA at 1-800-FDA-1088. Where should I keep my medicine? Keep out of the reach of children. Store at room temperature between 15 and 30 degrees C (59 and 86 degrees F). Throw away any unused medicine after the expiration date. NOTE: This sheet is a summary. It may not cover all possible information. If  you have questions about this medicine, talk to your doctor, pharmacist, or health care provider.  2020 Elsevier/Gold Standard (2018-05-06 00:21:31) Rolanda Lundborg injection What is this medicine? FREMANEZUMAB (fre ma NEZ ue mab) is used to prevent migraine headaches. This medicine may be used for other purposes; ask your health care  provider or pharmacist if you have questions. COMMON BRAND NAME(S): AJOVY What should I tell my health care provider before I take this medicine? They need to know if you have any of these conditions:  an unusual or allergic reaction to fremanezumab, other medicines, foods, dyes, or preservatives  pregnant or trying to get pregnant  breast-feeding How should I use this medicine? This medicine is for injection under the skin. You will be taught how to prepare and give this medicine. Use exactly as directed. Take your medicine at regular intervals. Do not take your medicine more often than directed. It is important that you put your used needles and syringes in a special sharps container. Do not put them in a trash can. If you do not have a sharps container, call your pharmacist or healthcare provider to get one. Talk to your pediatrician regarding the use of this medicine in children. Special care may be needed. Overdosage: If you think you have taken too much of this medicine contact a poison control center or emergency room at once. NOTE: This medicine is only for you. Do not share this medicine with others. What if I miss a dose? If you miss a dose, take it as soon as you can. If it is almost time for your next dose, take only that dose. Do not take double or extra doses. What may interact with this medicine? Interactions are not expected. This list may not describe all possible interactions. Give your health care provider a list of all the medicines, herbs, non-prescription drugs, or dietary supplements you use. Also tell them if you smoke, drink alcohol, or use illegal drugs. Some items may interact with your medicine. What should I watch for while using this medicine? Tell your doctor or healthcare professional if your symptoms do not start to get better or if they get worse. What side effects may I notice from receiving this medicine? Side effects that you should report to your doctor  or health care professional as soon as possible:  allergic reactions like skin rash, itching or hives, swelling of the face, lips, or tongue Side effects that usually do not require medical attention (report these to your doctor or health care professional if they continue or are bothersome):  pain, redness, or irritation at site where injected This list may not describe all possible side effects. Call your doctor for medical advice about side effects. You may report side effects to FDA at 1-800-FDA-1088. Where should I keep my medicine? Keep out of the reach of children. You will be instructed on how to store this medicine. Throw away any unused medicine after the expiration date on the label. NOTE: This sheet is a summary. It may not cover all possible information. If you have questions about this medicine, talk to your doctor, pharmacist, or health care provider.  2020 Elsevier/Gold Standard (2016-11-20 17:22:56) Trazodone tablets What is this medicine? TRAZODONE (TRAZ oh done) is used to treat depression. This medicine may be used for other purposes; ask your health care provider or pharmacist if you have questions. COMMON BRAND NAME(S): Desyrel What should I tell my health care provider before I take this medicine? They need to know  if you have any of these conditions:  attempted suicide or thinking about it  bipolar disorder  bleeding problems  glaucoma  heart disease, or previous heart attack  irregular heart beat  kidney or liver disease  low levels of sodium in the blood  an unusual or allergic reaction to trazodone, other medicines, foods, dyes or preservatives  pregnant or trying to get pregnant  breast-feeding How should I use this medicine? Take this medicine by mouth with a glass of water. Follow the directions on the prescription label. Take this medicine shortly after a meal or a light snack. Take your medicine at regular intervals. Do not take your medicine  more often than directed. Do not stop taking this medicine suddenly except upon the advice of your doctor. Stopping this medicine too quickly may cause serious side effects or your condition may worsen. A special MedGuide will be given to you by the pharmacist with each prescription and refill. Be sure to read this information carefully each time. Talk to your pediatrician regarding the use of this medicine in children. Special care may be needed. Overdosage: If you think you have taken too much of this medicine contact a poison control center or emergency room at once. NOTE: This medicine is only for you. Do not share this medicine with others. What if I miss a dose? If you miss a dose, take it as soon as you can. If it is almost time for your next dose, take only that dose. Do not take double or extra doses. What may interact with this medicine? Do not take this medicine with any of the following medications:  certain medicines for fungal infections like fluconazole, itraconazole, ketoconazole, posaconazole, voriconazole  cisapride  dronedarone  linezolid  MAOIs like Carbex, Eldepryl, Marplan, Nardil, and Parnate  mesoridazine  methylene blue (injected into a vein)  pimozide  saquinavir  thioridazine This medicine may also interact with the following medications:  alcohol  antiviral medicines for HIV or AIDS  aspirin and aspirin-like medicines  barbiturates like phenobarbital  certain medicines for blood pressure, heart disease, irregular heart beat  certain medicines for depression, anxiety, or psychotic disturbances  certain medicines for migraine headache like almotriptan, eletriptan, frovatriptan, naratriptan, rizatriptan, sumatriptan, zolmitriptan  certain medicines for seizures like carbamazepine and phenytoin  certain medicines for sleep  certain medicines that treat or prevent blood clots like dalteparin, enoxaparin,  warfarin  digoxin  fentanyl  lithium  NSAIDS, medicines for pain and inflammation, like ibuprofen or naproxen  other medicines that prolong the QT interval (cause an abnormal heart rhythm) like dofetilide  rasagiline  supplements like St. John's wort, kava kava, valerian  tramadol  tryptophan This list may not describe all possible interactions. Give your health care provider a list of all the medicines, herbs, non-prescription drugs, or dietary supplements you use. Also tell them if you smoke, drink alcohol, or use illegal drugs. Some items may interact with your medicine. What should I watch for while using this medicine? Tell your doctor if your symptoms do not get better or if they get worse. Visit your doctor or health care professional for regular checks on your progress. Because it may take several weeks to see the full effects of this medicine, it is important to continue your treatment as prescribed by your doctor. Patients and their families should watch out for new or worsening thoughts of suicide or depression. Also watch out for sudden changes in feelings such as feeling anxious, agitated, panicky, irritable, hostile,  aggressive, impulsive, severely restless, overly excited and hyperactive, or not being able to sleep. If this happens, especially at the beginning of treatment or after a change in dose, call your health care professional. Dennis Bast may get drowsy or dizzy. Do not drive, use machinery, or do anything that needs mental alertness until you know how this medicine affects you. Do not stand or sit up quickly, especially if you are an older patient. This reduces the risk of dizzy or fainting spells. Alcohol may interfere with the effect of this medicine. Avoid alcoholic drinks. This medicine may cause dry eyes and blurred vision. If you wear contact lenses you may feel some discomfort. Lubricating drops may help. See your eye doctor if the problem does not go away or is  severe. Your mouth may get dry. Chewing sugarless gum, sucking hard candy and drinking plenty of water may help. Contact your doctor if the problem does not go away or is severe. What side effects may I notice from receiving this medicine? Side effects that you should report to your doctor or health care professional as soon as possible:  allergic reactions like skin rash, itching or hives, swelling of the face, lips, or tongue  elevated mood, decreased need for sleep, racing thoughts, impulsive behavior  confusion  fast, irregular heartbeat  feeling faint or lightheaded, falls  feeling agitated, angry, or irritable  loss of balance or coordination  painful or prolonged erections  restlessness, pacing, inability to keep still  suicidal thoughts or other mood changes  tremors  trouble sleeping  seizures  unusual bleeding or bruising Side effects that usually do not require medical attention (report to your doctor or health care professional if they continue or are bothersome):  change in sex drive or performance  change in appetite or weight  constipation  headache  muscle aches or pains  nausea This list may not describe all possible side effects. Call your doctor for medical advice about side effects. You may report side effects to FDA at 1-800-FDA-1088. Where should I keep my medicine? Keep out of the reach of children. Store at room temperature between 15 and 30 degrees C (59 to 86 degrees F). Protect from light. Keep container tightly closed. Throw away any unused medicine after the expiration date. NOTE: This sheet is a summary. It may not cover all possible information. If you have questions about this medicine, talk to your doctor, pharmacist, or health care provider.  2020 Elsevier/Gold Standard (2018-02-12 11:46:46)

## 2019-10-03 ENCOUNTER — Encounter: Payer: 59 | Admitting: Obstetrics & Gynecology

## 2019-10-06 ENCOUNTER — Other Ambulatory Visit: Payer: Self-pay

## 2019-10-06 ENCOUNTER — Encounter: Payer: Self-pay | Admitting: Obstetrics & Gynecology

## 2019-10-06 ENCOUNTER — Ambulatory Visit (INDEPENDENT_AMBULATORY_CARE_PROVIDER_SITE_OTHER): Payer: 59 | Admitting: Obstetrics & Gynecology

## 2019-10-06 DIAGNOSIS — N920 Excessive and frequent menstruation with regular cycle: Secondary | ICD-10-CM | POA: Diagnosis not present

## 2019-10-06 DIAGNOSIS — E559 Vitamin D deficiency, unspecified: Secondary | ICD-10-CM

## 2019-10-06 DIAGNOSIS — D509 Iron deficiency anemia, unspecified: Secondary | ICD-10-CM | POA: Diagnosis not present

## 2019-10-06 DIAGNOSIS — N870 Mild cervical dysplasia: Secondary | ICD-10-CM

## 2019-10-06 DIAGNOSIS — R87610 Atypical squamous cells of undetermined significance on cytologic smear of cervix (ASC-US): Secondary | ICD-10-CM

## 2019-10-06 DIAGNOSIS — R87810 Cervical high risk human papillomavirus (HPV) DNA test positive: Secondary | ICD-10-CM

## 2019-10-06 NOTE — Progress Notes (Signed)
    [name] 04-23-1970 [id number]        49 y.o.  G4P0013   RP: ASCUS/HPV HR positive for Colposcopy  HPI: ASCUS/HPV HR positive 06/2019.  Continued Menorrhagia.  Received IV Iron x 2.  Vit D low, protocole finished.   OB History  Gravida Para Term Preterm AB Living  4 3     1 3   SAB TAB Ectopic Multiple Live Births               # Outcome Date GA Lbr Len/2nd Weight Sex Delivery Anes PTL Lv  4 AB           3 Para           2 Para           1 Para             Past medical history,surgical history, problem list, medications, allergies, family history and social history were all reviewed and documented in the EPIC chart.   Directed ROS with pertinent positives and negatives documented in the history of present illness/assessment and plan.  Exam:  There were no vitals filed for this visit. General appearance:  Normal  Colposcopy Procedure Note [name] 10/06/2019  Indications: ASCUS with high risk HPV positive.  Procedure Details  The risks and benefits of the procedure and Verbal informed consent obtained.  Speculum placed in vagina and excellent visualization of cervix achieved, cervix swabbed x 3 with acetic acid solution.  Findings:  Cervix colposcopy: Physical Exam Genitourinary:       Vaginal colposcopy: Normal  Vulvar colposcopy: Normal  Perirectal colposcopy: Normal  The cervix was sprayed with Hurricane before performing the cervical biopsies.  Specimens: HPV 16-18-45.  Cervical Bxs 10 and 2 O'Clock.  Complications:  . Plan:   Assessment/Plan:  49 y.o. G4P0013   1. ASCUS with positive high risk HPV cervical ASCUS with high risk HPV positive.  Colposcopy procedure discussed.  Colposcopy findings reviewed with patient.  We will manage per HPV 16-18-45 and cervical biopsy results.  Postprocedure precautions reviewed.  2. Menorrhagia with regular cycle We will recheck a CBC today.  IV iron as needed.  Hysterectomy  recommended, patient will schedule when ready.  3. Iron deficiency anemia, unspecified iron deficiency anemia type - CBC  4. Vitamin D deficiency - VITAMIN D 25 Hydroxy (Vit-D Deficiency, Fractures)  Princess Bruins MD, 2:51 PM 10/06/2019

## 2019-10-07 LAB — VITAMIN D 25 HYDROXY (VIT D DEFICIENCY, FRACTURES): Vit D, 25-Hydroxy: 14 ng/mL — ABNORMAL LOW (ref 30–100)

## 2019-10-07 LAB — CBC
HCT: 33.2 % — ABNORMAL LOW (ref 35.0–45.0)
Hemoglobin: 9.6 g/dL — ABNORMAL LOW (ref 11.7–15.5)
MCH: 20.3 pg — ABNORMAL LOW (ref 27.0–33.0)
MCHC: 28.9 g/dL — ABNORMAL LOW (ref 32.0–36.0)
MCV: 70.2 fL — ABNORMAL LOW (ref 80.0–100.0)
MPV: 9.4 fL (ref 7.5–12.5)
Platelets: 469 10*3/uL — ABNORMAL HIGH (ref 140–400)
RBC: 4.73 10*6/uL (ref 3.80–5.10)
RDW: 18.3 % — ABNORMAL HIGH (ref 11.0–15.0)
WBC: 5.8 10*3/uL (ref 3.8–10.8)

## 2019-10-08 LAB — HPV TYPE 16 AND 18/45 RNA
HPV Type 16 RNA: NOT DETECTED
HPV Type 18/45 RNA: NOT DETECTED

## 2019-10-09 LAB — TISSUE PATH REPORT

## 2019-10-09 LAB — PATHOLOGY REPORT

## 2019-10-10 ENCOUNTER — Other Ambulatory Visit: Payer: Self-pay | Admitting: Obstetrics & Gynecology

## 2019-10-10 ENCOUNTER — Other Ambulatory Visit: Payer: Self-pay | Admitting: *Deleted

## 2019-10-10 ENCOUNTER — Encounter: Payer: Self-pay | Admitting: Family Medicine

## 2019-10-10 ENCOUNTER — Encounter: Payer: Self-pay | Admitting: Obstetrics & Gynecology

## 2019-10-10 DIAGNOSIS — D500 Iron deficiency anemia secondary to blood loss (chronic): Secondary | ICD-10-CM

## 2019-10-10 NOTE — Progress Notes (Signed)
CBC order per MyChart notes KW CMA/ML

## 2019-10-20 NOTE — Telephone Encounter (Signed)
This is nothing she should be alarmed about, but yes the regular CXR would tell us what we need to know. It has been ordered, so she can just walk in to [facility] to get this done

## 2019-10-28 ENCOUNTER — Telehealth: Payer: Self-pay

## 2019-10-28 NOTE — Telephone Encounter (Signed)
Patient wrote in e-mail "I  know she and I discussed the robotic surgery, but since I have had previous abdominal surgeries, I may not be the best surgical candidate for the robotic surgery, may be too much scar tissue. I'm fine with whatever she decides."   Dr. Marguerita Merles- I just want to remind you of this and that okay to proceed with scheduling her for XI Robotic TLH/BSE in November.

## 2019-10-29 NOTE — Telephone Encounter (Signed)
Yes for XI Robotic TLH/Bilateral Salpingectomy, Lysis of Adhesions, possible laparotomy.

## 2019-11-06 ENCOUNTER — Telehealth: Payer: Self-pay | Admitting: Neurology

## 2019-11-06 NOTE — Telephone Encounter (Signed)
I have tried calling patient I can't reach her to schedule called ser val times Thanks Hinton Dyer

## 2019-12-01 ENCOUNTER — Telehealth: Payer: Self-pay

## 2019-12-01 NOTE — Telephone Encounter (Signed)
Patient is scheduled for Robotic TLH, BSE 12.21.21.  She said she has chronic anemia and feels like her iron might be low again. She wants to make sure it is good for surgery.  What to rec?

## 2019-12-02 ENCOUNTER — Other Ambulatory Visit: Payer: Self-pay

## 2019-12-02 DIAGNOSIS — D500 Iron deficiency anemia secondary to blood loss (chronic): Secondary | ICD-10-CM

## 2019-12-02 NOTE — Telephone Encounter (Signed)
Patient advised to come for CBC as soon as she can.  Order placed. She was driving and said she will call back for lab appointment.

## 2019-12-02 NOTE — Telephone Encounter (Signed)
Bring in for a CBC.

## 2020-01-08 ENCOUNTER — Emergency Department (HOSPITAL_COMMUNITY): Payer: 59

## 2020-01-08 ENCOUNTER — Telehealth: Payer: Self-pay | Admitting: *Deleted

## 2020-01-08 ENCOUNTER — Encounter: Payer: Self-pay | Admitting: Anesthesiology

## 2020-01-08 ENCOUNTER — Emergency Department (HOSPITAL_COMMUNITY)
Admission: EM | Admit: 2020-01-08 | Discharge: 2020-01-08 | Disposition: A | Discharge status: Home / Self Care | Payer: 59 | Attending: Emergency Medicine | Admitting: Emergency Medicine

## 2020-01-08 ENCOUNTER — Other Ambulatory Visit: Payer: Self-pay

## 2020-01-08 DIAGNOSIS — I10 Essential (primary) hypertension: Secondary | ICD-10-CM | POA: Insufficient documentation

## 2020-01-08 DIAGNOSIS — R0602 Shortness of breath: Secondary | ICD-10-CM | POA: Insufficient documentation

## 2020-01-08 DIAGNOSIS — R079 Chest pain, unspecified: Secondary | ICD-10-CM | POA: Insufficient documentation

## 2020-01-08 DIAGNOSIS — R600 Localized edema: Secondary | ICD-10-CM | POA: Insufficient documentation

## 2020-01-08 DIAGNOSIS — R5383 Other fatigue: Secondary | ICD-10-CM | POA: Insufficient documentation

## 2020-01-08 DIAGNOSIS — R42 Dizziness and giddiness: Secondary | ICD-10-CM | POA: Insufficient documentation

## 2020-01-08 DIAGNOSIS — R002 Palpitations: Secondary | ICD-10-CM | POA: Diagnosis not present

## 2020-01-08 LAB — TROPONIN I (HIGH SENSITIVITY)
Troponin I (High Sensitivity): <2 ng/L (ref <18)
Troponin I (High Sensitivity): <2 ng/L (ref <18)

## 2020-01-08 LAB — BASIC METABOLIC PANEL
Anion gap: 9 (ref 5–15)
BUN: 11 mg/dL (ref 6–20)
CO2: 24 mmol/L (ref 22–32)
Calcium: 9.7 mg/dL (ref 8.9–10.3)
Chloride: 104 mmol/L (ref 98–111)
Creatinine, Ser: 0.7 mg/dL (ref 0.44–1.00)
GFR, Estimated: >60 mL/min (ref >60)
Glucose, Bld: 101 mg/dL — ABNORMAL HIGH (ref 70–99)
Potassium: 3.9 mmol/L (ref 3.5–5.1)
Sodium: 137 mmol/L (ref 135–145)

## 2020-01-08 LAB — CBC
HCT: 28.1 % — ABNORMAL LOW (ref 36.0–46.0)
Hemoglobin: 7.5 g/dL — ABNORMAL LOW (ref 12.0–15.0)
MCH: 18.1 pg — ABNORMAL LOW (ref 26.0–34.0)
MCHC: 26.7 g/dL — ABNORMAL LOW (ref 30.0–36.0)
MCV: 67.7 fL — ABNORMAL LOW (ref 80.0–100.0)
Platelets: 492 10*3/uL — ABNORMAL HIGH (ref 150–400)
RBC: 4.15 MIL/uL (ref 3.87–5.11)
RDW: 18 % — ABNORMAL HIGH (ref 11.5–15.5)
WBC: 4.8 10*3/uL (ref 4.0–10.5)
nRBC: 0 % (ref 0.0–0.2)

## 2020-01-08 LAB — BRAIN NATRIURETIC PEPTIDE: B Natriuretic Peptide: 30.3 pg/mL (ref 0.0–100.0)

## 2020-01-08 LAB — I-STAT BETA HCG BLOOD, ED (MC, WL, AP ONLY): I-stat hCG, quantitative: <5 m[IU]/mL (ref <5)

## 2020-01-08 MED ORDER — IOHEXOL 350 MG/ML SOLN
55.0000 mL | Freq: Once | INTRAVENOUS | Status: AC | PRN
  Administered 2020-01-08: 55 mL via INTRAVENOUS

## 2020-01-08 NOTE — Telephone Encounter (Signed)
Recommend Feraheme IV x 2.

## 2020-01-08 NOTE — ED Triage Notes (Signed)
Pt states that she has had this on going chest pain for 1 month and the last few nights it has been more and more. Pt states that the ache causes SOB especially with walking and heart pounds. Pt state that occasionally will have swelling in ankles. Pt dr wanted to get her checked out.

## 2020-01-08 NOTE — ED Provider Notes (Signed)
I have personally seen and examined the patient. I have reviewed the documentation on PMH/FH/Soc Hx. I have discussed the plan of care with the resident and patient.  I have reviewed and agree with the resident's documentation. Please see associated encounter note.  Briefly, the patient is a 49 y.o. female here with shortness of breath, chest pain.  Patient with history of PE but not on blood thinners.  Blood clot was after surgery.  She has had some increasing shortness of breath and chest pain for the past month.  History of anemia.  No longer on iron.  Is due for hysterectomy next month.  Hemoglobin slightly below baseline at 7.5.  Otherwise troponin negative x2.  BNP unremarkable.  Otherwise no significant electrolyte abnormality, AKI.  Given her history of PE and symptoms will get a CT scan of her chest to rule out PE.  However suspect that she is having some symptomatic anemia.  Believe that this will improve following hysterectomy next month.  Does not need a transfusion as hemoglobin is above 7 and not having symptoms at rest.  Discussed with her about following up with primary care doctor to restart iron infusions or setting up an outpatient blood transfusion.  This chart was dictated using voice recognition software.  Despite best efforts to proofread,  errors can occur which can change the documentation meaning.     EKG Interpretation  Date/Time:  Thursday January 08 2020 10:32:45 EDT Ventricular Rate:  84 PR Interval:  124 QRS Duration: 92 QT Interval:  388 QTC Calculation: 458 R Axis:   85 Text Interpretation: Normal sinus rhythm with sinus arrhythmia Nonspecific ST and T wave abnormality Abnormal ECG Confirmed by Lennice Sites [phone]) on 01/08/2020 12:24:32 PM         Lennice Sites, DO 01/08/20 [id number]

## 2020-01-08 NOTE — Telephone Encounter (Signed)
Patient called was seen in the ER today due to cardiac symptoms, patient said the ER doctor told her to follow up with PCP or GYN to regarding hemoglobin now at 7.5. patient is scheduled for surgery on 02/24/20 for XI Robotic TLH/Bilateral Salpingectomy. Notes in epic. Please advise

## 2020-01-08 NOTE — ED Provider Notes (Signed)
[facility] EMERGENCY DEPARTMENT Provider Note   CSN: [id number] Arrival date & time: 01/08/20  1028     History Chief Complaint  Patient presents with  . Chest Pain    [name] is a 49 y.o. female.  The history is provided by the patient.  Chest Pain Pain location:  L chest Pain quality: aching   Pain radiates to:  Does not radiate Pain severity:  Mild Onset quality:  Gradual Duration:  2 months Timing:  Intermittent Progression:  Worsening Chronicity:  New Context: breathing and movement   Relieved by:  Rest Associated symptoms: dizziness, fatigue, lower extremity edema, palpitations and shortness of breath   Associated symptoms: no abdominal pain, no cough, no fever, no nausea, no numbness and no vomiting   Dizziness:    Severity:  Mild   Dizziness duration: episodic with SOB.   Timing:  Intermittent   Progression:  Resolved Shortness of breath:    Severity:  Moderate   Onset quality:  Gradual   Duration:  2 months   Timing:  Intermittent   Progression:  Worsening   HPI: A 49 year old patient with a history of hypertension presents for evaluation of chest pain. Initial onset of pain was more than 6 hours ago. The patient's chest pain is described as heaviness/pressure/tightness and is worse with exertion. The patient's chest pain is middle- or left-sided, is not well-localized, is not sharp and does not radiate to the arms/jaw/neck. The patient does not complain of nausea and denies diaphoresis. The patient has no history of stroke, has no history of peripheral artery disease, has not smoked in the past 90 days, denies any history of treated diabetes, has no relevant family history of coronary artery disease (first degree relative at less than age 40), has no history of hypercholesterolemia and does not have an elevated BMI (>=30).   Past Medical History:  Diagnosis Date  . Anemia    iron deficiency   . Cardiac arrhythmia   . Cellulitis    . Chronic headaches   . Chronic lumbar pain since 2013   sees Dr. Arvilla Market   . Clotting disorder ([place])    PE prior to pregnancy.  . Interstitial cystitis   . Palpitations 02/19/2018  . PE (pulmonary embolism)    due to surgery  . Sepsis ([place])    due to surgery  . Staph infection     Patient Active Problem List   Diagnosis Date Noted  . Post concussion syndrome 10/01/2019  . Chronic migraine without aura, with intractable migraine, so stated, with status migrainosus 10/01/2019  . History of pulmonary embolus (PE) 03/15/2018  . Iron deficiency anemia 03/15/2018  . Palpitations 02/19/2018  . Hypokalemia 02/24/2015  . Chronic lumbar pain 01/26/2015  . LEG EDEMA 08/05/2009  . ANXIETY 05/14/2009  . ADHD 05/14/2009  . PHARYNGITIS 03/20/2008  . ACUTE BRONCHITIS 01/08/2008  . DIZZINESS 01/01/2008  . EYE PAIN 09/09/2007  . INSOMNIA 09/09/2007  . BENIGN POSITIONAL VERTIGO 07/25/2007  . DEPRESSION 10/19/2006  . Essential hypertension 10/19/2006  . HEADACHE 10/19/2006    Past Surgical History:  Procedure Laterality Date  . BREAST REDUCTION SURGERY    . COLONOSCOPY  06/25/2019   per Dr. Havery Moros, adeomatous polyps, repeat in 3 yrs   . EXPLORATORY LAPAROTOMY     with left ovary and mass removed  . RADIOFREQUENCY ABLATION NERVES     lumbar spine, per Dr. Ace Gins   . TUBAL LIGATION  OB History    Gravida  4   Para  3   Term      Preterm      AB  1   Living  3     SAB      TAB      Ectopic      Multiple      Live Births              Family History  Problem Relation Age of Onset  . Colon polyps Mother   . Irritable bowel syndrome Mother   . Heart defect Mother        tachycardia  . Colon polyps Paternal Grandmother   . Hypertension Paternal Grandmother   . Heart defect Paternal Grandmother   . Heart attack Paternal Grandmother   . Colon polyps Maternal Grandmother   . Hypertension Maternal Grandmother   . Diabetes Paternal  Grandfather   . Heart disease Paternal Grandfather   . Hypertension Paternal Grandfather   . Heart attack Paternal Grandfather   . Diabetes Maternal Grandfather   . Heart disease Maternal Grandfather   . Hypertension Maternal Grandfather   . Diabetes Sister   . Hypertension Sister   . Heart Problems Sister   . Irritable bowel syndrome Paternal Aunt   . Cancer Paternal Aunt        melanoma  . Liver disease Father   . Hypertension Father   . COPD Father   . Colon cancer Neg Hx   . Esophageal cancer Neg Hx   . Rectal cancer Neg Hx   . Stomach cancer Neg Hx     Social History   Tobacco Use  . Smoking status: Never Smoker  . Smokeless tobacco: Never Used  . Tobacco comment: age 48  Vaping Use  . Vaping Use: Never used  Substance Use Topics  . Alcohol use: Yes    Alcohol/week: 0.0 standard drinks    Comment: glass of wine occasionally  . Drug use: No    Home Medications Prior to Admission medications   Medication Sig Start Date End Date Taking? Authorizing Provider  Cyanocobalamin (B-12 SL) Place under the tongue daily.     [provider]  cyclobenzaprine (FLEXERIL) 10 MG tablet Take 1 tablet (10 mg total) by mouth 3 (three) times daily as needed for muscle spasms. 02/11/19   Laurey Morale, MD  Fremanezumab-vfrm (AJOVY) 225 MG/1.5ML SOAJ Inject 225 mg into the skin every 30 (thirty) days. 10/01/19   Melvenia Beam, MD  gabapentin (NEURONTIN) 300 MG capsule Take 300 mg by mouth 3 (three) times daily.    [provider]  ibuprofen (ADVIL,MOTRIN) 200 MG tablet Take 200 mg by mouth as needed.    [provider]  Rimegepant Sulfate (NURTEC) 75 MG TBDP Take 75 mg by mouth every other day. For migraines. Take as close to onset of migraine as possible. One daily maximum. 10/01/19   Melvenia Beam, MD  traZODone (DESYREL) 50 MG tablet Take 1-2 tablets (50-100 mg total) by mouth at bedtime. 10/01/19   Melvenia Beam, MD  UNABLE TO FIND Med Name: Iron  Infusions    [provider]    Allergies    Patient has no known allergies.  Review of Systems   Review of Systems  Constitutional: Positive for fatigue. Negative for fever.  HENT: Negative for congestion and sore throat.   Respiratory: Positive for shortness of breath. Negative for cough.   Cardiovascular: Positive for  chest pain, palpitations and leg swelling.  Gastrointestinal: Negative for abdominal pain, diarrhea, nausea and vomiting.  Genitourinary: Negative for difficulty urinating.  Musculoskeletal: Negative for joint swelling.  Skin: Negative for color change.  Neurological: Positive for dizziness. Negative for numbness.  All other systems reviewed and are negative.   Physical Exam Updated Vital Signs BP 115/71   Pulse 89   Temp 98.1 F (36.7 C) (Oral)   Resp 18   Ht 5\' 6"  (1.676 m)   Wt 77.1 kg   LMP 01/06/2020   SpO2 100%   BMI 27.44 kg/m   Physical Exam Vitals reviewed.  Constitutional:      General: She is not in acute distress.    Appearance: Normal appearance. She is not diaphoretic.  HENT:     Head: Normocephalic and atraumatic.     Nose: Nose normal.     Mouth/Throat:     Mouth: Mucous membranes are moist.     Pharynx: Oropharynx is clear.  Eyes:     Conjunctiva/sclera: Conjunctivae normal.  Cardiovascular:     Rate and Rhythm: Normal rate.     Pulses: Normal pulses.     Heart sounds: Normal heart sounds.  Pulmonary:     Effort: Pulmonary effort is normal. No respiratory distress.     Breath sounds: Normal breath sounds. No wheezing, rhonchi or rales.  Abdominal:     General: Abdomen is flat.     Palpations: Abdomen is soft.     Tenderness: There is no abdominal tenderness.  Musculoskeletal:     Cervical back: Neck supple.     Right lower leg: No edema.     Left lower leg: No edema.  Skin:    General: Skin is warm and dry.  Neurological:     Mental Status: She is alert.  Psychiatric:        Mood and Affect: Mood normal.         Behavior: Behavior normal.     ED Results / Procedures / Treatments   Labs (all labs ordered are listed, but only abnormal results are displayed) Labs Reviewed  BASIC METABOLIC PANEL - Abnormal; Notable for the following components:      Result Value   Glucose, Bld 101 (*)    All other components within normal limits  CBC - Abnormal; Notable for the following components:   Hemoglobin 7.5 (*)    HCT 28.1 (*)    MCV 67.7 (*)    MCH 18.1 (*)    MCHC 26.7 (*)    RDW 18.0 (*)    Platelets 492 (*)    All other components within normal limits  BRAIN NATRIURETIC PEPTIDE  OCCULT BLOOD X 1 CARD TO LAB, STOOL  I-STAT BETA HCG BLOOD, ED ([facility], [facility], [facility] ONLY)  TROPONIN I (HIGH SENSITIVITY)  TROPONIN I (HIGH SENSITIVITY)    EKG EKG Interpretation  Date/Time:  Thursday January 08 2020 10:32:45 EDT Ventricular Rate:  84 PR Interval:  124 QRS Duration: 92 QT Interval:  388 QTC Calculation: 458 R Axis:   85 Text Interpretation: Normal sinus rhythm with sinus arrhythmia Nonspecific ST and T wave abnormality Abnormal ECG Confirmed by Lennice Sites [phone]) on 01/08/2020 12:24:32 PM   Radiology DG Chest 2 View  Result Date: 01/08/2020 CLINICAL DATA:  Ongoing chest pain for 1 month increased in last few nights, chest pain causes shortness of breath especially with walking, heart pounds EXAM: CHEST - 2 VIEW COMPARISON:  02/08/2019 FINDINGS: Normal heart size, mediastinal contours,  and pulmonary vascularity. Lungs clear. No pleural effusion or pneumothorax. Bones unremarkable. IMPRESSION: Normal exam. Electronically Signed   By: Lavonia Dana M.D.   On: 01/08/2020 10:59   CT Angio Chest PE W and/or Wo Contrast  Result Date: 01/08/2020 CLINICAL DATA:  Chest pain and shortness of breath. EXAM: CT ANGIOGRAPHY CHEST WITH CONTRAST TECHNIQUE: Multidetector CT imaging of the chest was performed using the standard protocol during bolus administration of intravenous contrast. Multiplanar CT image  reconstructions and MIPs were obtained to evaluate the vascular anatomy. CONTRAST:  54mL OMNIPAQUE IOHEXOL 350 MG/ML SOLN COMPARISON:  Chest radiographs obtained earlier today. Chest CTA dated 09/04/2006. FINDINGS: Cardiovascular: Satisfactory opacification of the pulmonary arteries to the segmental level. No evidence of pulmonary embolism. Normal heart size. No pericardial effusion. Mediastinum/Nodes: No enlarged mediastinal, hilar, or axillary lymph nodes. Thyroid gland, trachea, and esophagus demonstrate no significant findings. Lungs/Pleura: Small calcified granuloma in the lateral aspect of the right upper lobe. No airspace consolidation or pleural fluid. Upper Abdomen: Unremarkable. Musculoskeletal: Mild thoracic spine degenerative changes. Review of the MIP images confirms the above findings. IMPRESSION: No pulmonary emboli or acute abnormality. Electronically Signed   By: Claudie Revering M.D.   On: 01/08/2020 14:48    Procedures Procedures (including critical care time)  Medications Ordered in ED Medications  iohexol (OMNIPAQUE) 350 MG/ML injection 55 mL (55 mLs Intravenous Contrast Given 01/08/20 1425)    ED Course  I have reviewed the triage vital signs and the nursing notes.  Pertinent labs & imaging results that were available during my care of the patient were reviewed by me and considered in my medical decision making (see chart for details).    MDM Rules/Calculators/A&P HEAR Score: 3                        Medical Decision Making: IVIANNA NOTCH is a 49 y.o. female who presented to the ED today with CP and SOB. Pt reports 2 months of sx, worsening over last week. Pt reports palpitations with L sided CP occurring at rest (usually at night going to sleep), and episodic SOB with minimal exertion, during episodes pt has lightheadedness, palpitations, CP and these sx resolve with rest.   Past medical history significant for HTN, chornic back pain, chronic occipital headaches,  Uterine  fibroids with severe menorrhagia and secondary anemia History of palpitations, seen by Cardiology in Jan 2020- they reported the 7-day monitor showed sinus rhythm sinus arrhythmia and a few PACs and PVCs, at this visit she was given lopressor and advised to better control anemia, concern this could be exacerbating her sx.  Had a hx of PE and DVT in post surgical setting. Not on anticoagulation. Mirena IUD. Chronic iron deficiency anemia, pt reports long hx related to very heavy menses. Pt reports she no longer takes Iron supplementation as it wasn't helping.    Reviewed and confirmed nursing documentation for past medical history, family history, social history.  On my initial exam, the pt was in NAD, normal WOB, lungs CTAB.   Hgb 7.5, pt is not symptomatic at rest, not tachycardic, will not transfuse today. Encouraged pt to follow up with PCP next week for repeat Hgb and possible transfusion as indicated. Patient is scheduled for a hysterectomy next month.    The ECG reveals no anatomical ischemia representing STEMI, New-Onset Arrhythmia, or ischemic equivalent. To further evaluate for ongoing myocardial ischemia, serial troponins will be ordered. The first of these is  not  elevated.  Repeat troponin at 3-hours was also not elevated. Therefore, I do not suspect ACS at this time.   The patient's presentation, the patient being hemodynamically stable, and the ECG are not consistent with Pericardial Tamponade. The patient's pain is not positional. This in conjunction with the lack of PR depressions and ST elevations on the ECG are reassuring against Pericarditis. The patient's non-elevated troponin and ECG are also inconsistent with Myocarditis.  The CXR is unremarkable for focal airspace disease.  The patient is afebrile and denies productive cough.  Therefore, I do not suspect Pneumonia. There is no evidence of Pneumothorax on physical exam or on the CXR. CXR shows no evidence of Esophageal Tear and  there is no recent intractable emesis or esophageal instrumentation. There is no peritonitis or free air on CXR worrisome for a Perforated Abdominal Viscous.  Pt has hx of PE and significant exertional SOB and CP. Will obtain CTA today to evaluate for PE. CTA without evidence of PE  The patient's pain is not described as tearing and it does not radiate to back. Pulses are present bilaterally in both the upper and lower extremities. CXR does not show a widened mediastinum. I have a very low suspicion for Aortic Dissection.  Consults: none performed  All radiology and laboratory studies reviewed independently and with my attending physician, agree with reading provided by radiologist unless otherwise noted.   Upon reassessing patient, patient was in NAD, resting comfortably.   Based on the above findings, I believe patient is hemodynamically stable for discharge.  Patient/and family educated about specific return precautions for given chief complaint and symptoms.  Patient/and family educated about follow-up with PCP and Gynecology and Cardiology.  Patient/and family expressed understanding of return precautions and need for follow-up.  Patient discharged.    The above care was discussed with and agreed upon by my attending physician.    Emergency Department Medication Summary:  Medications  iohexol (OMNIPAQUE) 350 MG/ML injection 55 mL (55 mLs Intravenous Contrast Given 01/08/20 1425)       Final Clinical Impression(s) / ED Diagnoses Final diagnoses:  Chest pain, unspecified type    Rx / DC Orders ED Discharge Orders    None       Roosevelt Locks, MD 01/08/20 [place], [place], DO 01/08/20 1538

## 2020-01-09 NOTE — Telephone Encounter (Signed)
Patient called because she "needs direction sooner rather than later" and wanted to see if Dr. Marguerita Merles is going to treat low hgb or if she should contact PCP.   I told her Dr. Marguerita Merles had replied and she will order IV Feraheme x 2 infusions.  Patient said last time in May when she ordered that [facility] required her have something different.    I called [employer] and was directed to [employer] because it required PA. Clinical notes were required and were faxed [phone]. Ref# [id number].  Will wait to hear what they advise and inform patient.

## 2020-01-12 NOTE — Telephone Encounter (Signed)
I called patient to touch base and let her know I am still waiting to hear from her ins co with approval. Still is in process. I will call her as soon as I hear.

## 2020-01-13 NOTE — Telephone Encounter (Signed)
I called [employer] and spoke with Zenia Resides. Call ref [phone] and made her aware of patient's extremely low hemoglobin. She said she has escalated this case and I will hear from the case manager by end of day today.

## 2020-01-13 NOTE — Telephone Encounter (Signed)
Bobbie RN from [facility] called to inform me that [name] is not a preferred medication in their iron infusion policy.  She said because of the urgent nature of this request she is forwarding it to the Market researcher.

## 2020-01-13 NOTE — Telephone Encounter (Signed)
[employer] has denied authorization for Feraheme due to it is not a covered medication/benefit.  We do not have records to support the criteria for appeal.  Anderson Malta tried to appeal this back in May and sent them her records and they still denied it.  There are 3 preferred meds: Ferriecit, Infed and Venofen and we "have to show documentation of history of intolerance, contraindication or severe adverse reaction to 2 of the 3 preferred iron therapies" before they will approve Feraheme. We tried to explain that the Ferriecit did not bring her hemoglobin up well and they suggest Dr. Dellis Filbert try one of the other approved medications/

## 2020-01-13 NOTE — Telephone Encounter (Signed)
I called and informed patient with below note. Patient said a case manager has been calling her that she believes is from [employer]. Patient has not been able to talk to the case manager yet, she asked me hold on everything so she can find out what the call is about.

## 2020-01-15 NOTE — Telephone Encounter (Signed)
Dr.Lavoie the case manager from [place] [facility] called stating that you can call and do a peer to peer on the doctor line [phone]. You will need to speak with the medical doctor and explain why patient needs Feraheme and the other step therapy drugs did not work. The case manager said patient will need to try/fail oral medication and other iron infusions.

## 2020-01-15 NOTE — Telephone Encounter (Signed)
I suggest Infed 2 mL IM daily (total of 46 mL).  Repeat Iron profile and CBC before starting treatment.

## 2020-01-16 ENCOUNTER — Encounter (HOSPITAL_COMMUNITY): Payer: Self-pay | Admitting: Vascular Surgery

## 2020-01-16 ENCOUNTER — Other Ambulatory Visit: Payer: Self-pay

## 2020-01-16 ENCOUNTER — Telehealth (HOSPITAL_COMMUNITY): Payer: Self-pay | Admitting: Vascular Surgery

## 2020-01-16 DIAGNOSIS — D500 Iron deficiency anemia secondary to blood loss (chronic): Secondary | ICD-10-CM

## 2020-01-16 MED ORDER — IRON DEXTRAN 50 MG/ML IJ SOLN: 100.0000 mg | Freq: Every day | INTRAMUSCULAR | 2 refills | Status: DC

## 2020-01-16 NOTE — Telephone Encounter (Signed)
Ok, I completed the order.  Please ask the patient to come to the office to have a test dose.  We will inject 0.5 mL IM to make sure she does not have allergies to it and tolerates it.  Then she will be able to give herself the 2 mL  IM injections daily.

## 2020-01-16 NOTE — Progress Notes (Signed)
Anesthesia Chart Review:  DISCUSSION: Patient is a 49 year old female who is being considered for right shoulder arthroscopy, LOA, subacromial decompression by Victorino December, MD. Anticipated surgery date around the week before or after Thanksgiving. He asked anesthesia to review prior to posting given her history of anemia (HGB 7.5 on 01/09/20) with pending hysterectomy (for fibroid/menorrhagia with anemia) scheduled for 02/24/20 by Princess Bruins, MD. Dr. Stann Mainland had initially scheduled surgery at an outside surgical center, but they did not feel she was an appropriate candidate there until HGB > 10. Patient is currently working with her GYN to get on iron injections. Dr. Stann Mainland is wanting to schedule surgery prior to hysterectomy because patient has failed conservative measure for adhesive capsulitis, and he is concerned her anemia may initially be worse after her hysterectomy.   History by chart review includes non-smoker, HTN, anemia, breast reduction complicated by sepsis and PE (2000), interstitial cystitis, palpitations, chronic back pain (s/p radiofrequency ablation to nerves), tubal sterilization 08/06/02, post-concussion syndrome, BPPV, migraines, leg edema, uterine fibroid/menorrhagia.    - ED visit 01/08/20 for 1-2 month history of ongoing chest pain with intermittent SOB, worse over the past week and with episodes of palpitations and lightheadedness and some ankle swelling. Labs showed negative Troponin x2, normal BNP, but HGB 7.5 (down from 9.6 three months prior) EKG and labs felt inconsistent with ACS, pericarditis, myocarditis. CXR negative for infiltrate or free air. CTA chest negative for PE. VSS, so decision made not to transfuse. She was advised to follow-up out-patient. (Since then patient has been in communication with her GYN Dr. Dellis Filbert with planned Feraheme infusion, but insurance would not approve, so appears now she is scheduled to start Infed IM on 01/16/20 or 01/19/20 and if  tolerated try daily injections.)   Discussed with anesthesiologist Renold Don, MD. Given chronic anemia, known menorrhagia, and plan to start iron treatments within days it is reasonable to go a head with scheduling shoulder arthroscopy for late November. As long as anemia is stable then it is not anticipated that anemia alone would be a limiting factor in moving forward with surgery; however, given her recent ED visit would recommend primary care clearance to ensure no additional preoperative recommendations. If preoperative HGB is < 10 would need a T&S with labs. I have updated Judeen Hammans at Dr. Dennie Maizes office. That case will be scheduled at [place]. Hopefully HGB will improve with iron, but if remains < 9 prior to hysterectomy then will need to revisit with anesthesiologist given that case is currently scheduled at [facility].    Will ask staff to scheduled PAT appointment once case is posted with Dr. Stann Mainland.   VS: LMP 01/06/2020   Wt Readings from Last 3 Encounters:  01/08/20 77.1 kg  10/01/19 81.2 kg  09/17/19 80.7 kg   BP Readings from Last 3 Encounters:  01/08/20 110/73  10/01/19 117/75  09/18/19 113/74   Pulse Readings from Last 3 Encounters:  01/08/20 85  10/01/19 88  09/18/19 89    PROVIDERS: Laurey Morale, MD is PCP  - Sarina Ill, MD is neurologist - Skeet Latch, MD is cardiologist. Last visit 03/15/18 with [name], PA-C for follow-up palpitations. She had had recent 7 days event monitor to look for SVT (sliglht hint of delta wave on EKG but "no obvious WPW"). Holter monitor showed rare PVCs and PACs. Anemia was thought to be a contributing factor. She was advised metoprolol 12.5 mg BID as needed for palpitations and continued follow-up with  PCP/GYN for management of anemia. Six week follow-up had been planned.  Princess Bruins, MD is GYN   LABS: Most recent lab results include: Lab Results  Component Value Date   WBC 4.8 01/08/2020   HGB 7.5 (L)  01/08/2020   HCT 28.1 (L) 01/08/2020   PLT 492 (H) 01/08/2020   GLUCOSE 101 (H) 01/08/2020   ALT 13 09/17/2019   AST 13 09/17/2019   NA 137 01/08/2020   K 3.9 01/08/2020   CL 104 01/08/2020   CREATININE 0.70 01/08/2020   BUN 11 01/08/2020   CO2 24 01/08/2020   TSH 1.12 09/17/2019     IMAGES: Chest CTA 01/08/20: IMPRESSION: No pulmonary emboli or acute abnormality.  CXR 01/08/20: FINDINGS: Normal heart size, mediastinal contours, and pulmonary vascularity. Lungs clear. No pleural effusion or pneumothorax. Bones unremarkable. IMPRESSION: Normal exam.   EKG: 01/08/20: Normal sinus rhythm with sinus arrhythmia Nonspecific ST and T wave abnormality Abnormal ECG Confirmed by Lennice Sites [id number]) on 01/08/2020 12:24:32 PM   CV: 7 day Event monitor 02/21/18: Quality: Fair.  Baseline artifact. Predominant rhythm: sinus  Average heart rate: 85 bpm Max heart rate: 152 bpm Min heart rate: 50 bpm <1% PACs and PVCs noted   Past Medical History:  Diagnosis Date  . Anemia    iron deficiency   . Cardiac arrhythmia   . Cellulitis   . Chronic headaches   . Chronic lumbar pain since 2013   sees Dr. Arvilla Market   . Clotting disorder ([place])    PE prior to pregnancy.  . Interstitial cystitis   . Palpitations 02/19/2018  . PE (pulmonary embolism)    due to surgery  . Sepsis ([place])    due to surgery  . Staph infection     Past Surgical History:  Procedure Laterality Date  . BREAST REDUCTION SURGERY    . COLONOSCOPY  06/25/2019   per Dr. Havery Moros, adeomatous polyps, repeat in 3 yrs   . EXPLORATORY LAPAROTOMY     with left ovary and mass removed  . RADIOFREQUENCY ABLATION NERVES     lumbar spine, per Dr. Ace Gins   . TUBAL LIGATION      MEDICATIONS: . Cyanocobalamin (B-12 SL)  . cyclobenzaprine (FLEXERIL) 10 MG tablet  . Fremanezumab-vfrm (AJOVY) 225 MG/1.5ML SOAJ  . gabapentin (NEURONTIN) 300 MG capsule  . ibuprofen (ADVIL,MOTRIN) 200 MG tablet  . Rimegepant  Sulfate (NURTEC) 75 MG TBDP  . traZODone (DESYREL) 50 MG tablet  . UNABLE TO FIND   . 0.9 %  sodium chloride infusion  . 0.9 %  sodium chloride infusion    Myra Gianotti, PA-C Surgical Short Stay/Anesthesiology [facility] Phone [phone] [facility] Phone [phone] 01/16/2020 3:28 PM

## 2020-01-16 NOTE — Telephone Encounter (Signed)
Dr.Lavoie I am not familiar this iron Indef IM, I placed the Rx in epic I am not sure if this is correct. If you could please check fill in directions and the Rx pending directions states inject 2 ml into vein and not IM, ( I guess you can change the directions) this was the only one in epic I could find similar to what you said below.

## 2020-01-16 NOTE — Telephone Encounter (Signed)
Patient was informed that  Dr. Marguerita Merles recommend [name] IM and Rx has been sent to her pharmacy.  I explained the need to come by the office lab before she starts treatment to have CBC and iron profile checked. Order placed. She does not think she can come today but will call back and let us know and make lab appt for either today or Monday.  I advised her after labs "We will inject 0.5 ml IM to make sure she does note have allergies to it and tolerates it. Then she will be ale to give herself the 2 mL IM injections daily."    Of note, patient was scheduled for shoulder surgery on this coming Monday but orthopedist cancelled it because her hgb was too low. There is some discussion with the orthopedist about tranfusion, infusion, etc.  I told her if that happens to be back in touch with [place].

## 2020-01-19 ENCOUNTER — Other Ambulatory Visit: Payer: 59

## 2020-01-19 ENCOUNTER — Other Ambulatory Visit: Payer: Self-pay

## 2020-01-19 DIAGNOSIS — D500 Iron deficiency anemia secondary to blood loss (chronic): Secondary | ICD-10-CM

## 2020-01-19 MED ORDER — NORETHINDRONE 0.35 MG PO TABS: 1.0000 | ORAL_TABLET | Freq: Every day | ORAL | 0 refills | Status: DC

## 2020-01-20 ENCOUNTER — Other Ambulatory Visit: Payer: Self-pay | Admitting: Obstetrics & Gynecology

## 2020-01-20 ENCOUNTER — Other Ambulatory Visit: Payer: Self-pay

## 2020-01-20 ENCOUNTER — Telehealth: Payer: Self-pay | Admitting: Cardiovascular Disease

## 2020-01-20 ENCOUNTER — Ambulatory Visit (INDEPENDENT_AMBULATORY_CARE_PROVIDER_SITE_OTHER): Payer: 59 | Admitting: *Deleted

## 2020-01-20 ENCOUNTER — Telehealth: Payer: Self-pay

## 2020-01-20 ENCOUNTER — Other Ambulatory Visit: Payer: 59

## 2020-01-20 DIAGNOSIS — D500 Iron deficiency anemia secondary to blood loss (chronic): Secondary | ICD-10-CM

## 2020-01-20 DIAGNOSIS — R82998 Other abnormal findings in urine: Secondary | ICD-10-CM

## 2020-01-20 LAB — CBC WITH DIFFERENTIAL/PLATELET
Absolute Monocytes: 510 cells/uL (ref 200–950)
Basophils Absolute: 82 cells/uL (ref 0–200)
Basophils Relative: 1.3 %
Eosinophils Absolute: 170 cells/uL (ref 15–500)
Eosinophils Relative: 2.7 %
HCT: 26.5 % — ABNORMAL LOW (ref 35.0–45.0)
Hemoglobin: 6.8 g/dL — ABNORMAL LOW (ref 11.7–15.5)
Lymphs Abs: 1588 cells/uL (ref 850–3900)
MCH: 16.5 pg — ABNORMAL LOW (ref 27.0–33.0)
MCHC: 25.7 g/dL — ABNORMAL LOW (ref 32.0–36.0)
MCV: 64.5 fL — ABNORMAL LOW (ref 80.0–100.0)
MPV: 9.7 fL (ref 7.5–12.5)
Monocytes Relative: 8.1 %
Neutro Abs: 3950 cells/uL (ref 1500–7800)
Neutrophils Relative %: 62.7 %
Platelets: 506 10*3/uL — ABNORMAL HIGH (ref 140–400)
RBC: 4.11 10*6/uL (ref 3.80–5.10)
RDW: 16.3 % — ABNORMAL HIGH (ref 11.0–15.0)
Total Lymphocyte: 25.2 %
WBC: 6.3 10*3/uL (ref 3.8–10.8)

## 2020-01-20 LAB — CBC MORPHOLOGY

## 2020-01-20 LAB — IRON,TIBC AND FERRITIN PANEL
%SAT: 1 % (calc) — ABNORMAL LOW (ref 16–45)
Ferritin: 1 ng/mL — ABNORMAL LOW (ref 16–232)
Iron: <10 ug/dL — ABNORMAL LOW (ref 40–190)
TIBC: 425 mcg/dL (calc) (ref 250–450)

## 2020-01-20 MED ORDER — IRON DEXTRAN 50 MG/ML IJ SOLN
0.5000 mg | Freq: Once | INTRAMUSCULAR | Status: AC
Start: 2020-01-20 — End: 2020-01-20
  Administered 2020-01-20: 0.5 mg via INTRAVENOUS

## 2020-01-20 MED ORDER — IRON DEXTRAN 50 MG/ML IJ SOLN: 0.5000 mg | Freq: Once | INTRAMUSCULAR | Status: DC

## 2020-01-20 NOTE — Telephone Encounter (Signed)
I called patient to inform her surgery has been moved to [place] and rescheduled to 03/03/20 at 8:30am.  She will see [name], [place] at [facility] office for cardiology clearance on 02/23/20. (That was their first available appointment and I was told they are in office all week and this should be fine even if she needs tests, etc.)  She will have blood tranfusion x 2 on Tues 01/27/20 (first available)  at 8:00am at [facility]. Appt scheduled and Dr. Marguerita Merles placed order.  We rescheduled her pre op Covid test to 03/01/20 at 9:00am per Ivin Booty, RN in [place].  Scheduled her for a nurse appointment at 4pm today to come by the office for injection of Infed per Dr. Marguerita Merles 01/08/20 office note.  New info packet for surgery will be mailed.

## 2020-01-21 ENCOUNTER — Telehealth: Payer: Self-pay | Admitting: *Deleted

## 2020-01-21 DIAGNOSIS — K922 Gastrointestinal hemorrhage, unspecified: Secondary | ICD-10-CM

## 2020-01-21 DIAGNOSIS — D500 Iron deficiency anemia secondary to blood loss (chronic): Secondary | ICD-10-CM

## 2020-01-21 NOTE — Telephone Encounter (Signed)
Patient called today to confirm the dosage of iron injection Rx sig: insert 2 ML into muscle daily. Patient said yesterday when in the office she received 0.5 mg dose of injection? And she wants to confirm she is to injection herself with 49ml daily patient report that will be the whole vial if that is correct. Please advise

## 2020-01-21 NOTE — Telephone Encounter (Signed)
-----   Message from Ramond Craver, [place] sent at 01/20/2020  4:50 PM EST ----- Regarding: referrals Per Dr. Marguerita Merles   Refer to [facility] for severe anemia disproportionate to menstrual blood loss.  Hb decreasing while no menses between the Hb checks.   Schedule Gastro evaluation.  Intestinal bleeding?

## 2020-01-21 NOTE — Telephone Encounter (Signed)
Referral placed for [facility] health Hematology and [facility] they will call patient to schedule.

## 2020-01-21 NOTE — Telephone Encounter (Signed)
Yes, 2 mL IM injections daily.  (We did 0.5 yesterday as a test dose)

## 2020-01-21 NOTE — Telephone Encounter (Signed)
Patient informed. 

## 2020-01-22 ENCOUNTER — Telehealth: Payer: Self-pay

## 2020-01-22 ENCOUNTER — Telehealth: Payer: Self-pay | Admitting: Hematology and Oncology

## 2020-01-22 LAB — URINE CULTURE
MICRO NUMBER:: 11209478
SPECIMEN QUALITY:: ADEQUATE

## 2020-01-22 LAB — URINALYSIS, COMPLETE W/RFL CULTURE
Bilirubin Urine: NEGATIVE
Glucose, UA: NEGATIVE
Hgb urine dipstick: NEGATIVE
Hyaline Cast: NONE SEEN /LPF
Ketones, ur: NEGATIVE
Nitrites, Initial: NEGATIVE
Protein, ur: NEGATIVE
RBC / HPF: NONE SEEN /HPF (ref 0–2)
Specific Gravity, Urine: 1.02 (ref 1.001–1.03)
pH: 6.5 (ref 5.0–8.0)

## 2020-01-22 LAB — CULTURE INDICATED

## 2020-01-22 NOTE — Telephone Encounter (Signed)
Patient called in voice mail stating that her husband has been talking with [employer] and [facility] and has made arrangements for directed donations for her blood transfusions.  Family will be donating the blood she will receive with transfusion.  She said the only thing Dr. Dellis Filbert needs to do is let Short Stay know that her transfusions will be directed blood donations.    I can call and inform them. Just wanted to make you aware before I did that.

## 2020-01-22 NOTE — Telephone Encounter (Signed)
Yes, please inform Short Stay of the directed family blood transfusions.

## 2020-01-22 NOTE — Telephone Encounter (Signed)
I received a new patient referral from Dr. Dellis Filbert for anemia. [name] has been cld and scheduled to see Dr. Chryl Heck on 12/3 at 11am. Pt aware to arrive 30 minuets early.

## 2020-01-23 ENCOUNTER — Telehealth: Payer: Self-pay

## 2020-01-23 NOTE — Telephone Encounter (Signed)
I spoke with [employer]. Husband and son donated blood last night for patient. It has been sent to [place], [place] to be processed. It will be tagged with specifics, ie hospital, Dr., date to be used, etc. And will be sent to [facility] in time for Tuesday 01/27/20 transfusion. They told me there was nothing Dr. Dellis Filbert needed to do.  I went ahead and added a note to her appointment note that it would be a directed donation for transfusion.

## 2020-01-23 NOTE — Telephone Encounter (Signed)
Encounter not needed

## 2020-01-26 ENCOUNTER — Encounter: Payer: Self-pay | Admitting: Physician Assistant

## 2020-01-26 ENCOUNTER — Other Ambulatory Visit (HOSPITAL_COMMUNITY): Payer: Self-pay | Admitting: *Deleted

## 2020-01-26 ENCOUNTER — Encounter: Payer: Self-pay | Admitting: Anesthesiology

## 2020-01-26 NOTE — Telephone Encounter (Signed)
1. Hematology appointment scheduled on 02/06/20  2. GI appointment on 02/12/20

## 2020-01-27 ENCOUNTER — Ambulatory Visit (HOSPITAL_COMMUNITY)
Admission: RE | Admit: 2020-01-27 | Discharge: 2020-01-27 | Disposition: A | Discharge status: Home / Self Care | Payer: 59 | Source: Ambulatory Visit | Attending: Obstetrics & Gynecology | Admitting: Obstetrics & Gynecology

## 2020-01-27 ENCOUNTER — Other Ambulatory Visit: Payer: Self-pay

## 2020-01-27 DIAGNOSIS — D500 Iron deficiency anemia secondary to blood loss (chronic): Secondary | ICD-10-CM | POA: Insufficient documentation

## 2020-01-27 LAB — ABO/RH: ABO/RH(D): O POS

## 2020-01-27 LAB — PREPARE RBC (CROSSMATCH)

## 2020-01-27 MED ORDER — SODIUM CHLORIDE 0.9% IV SOLUTION: Freq: Once | INTRAVENOUS | Status: DC

## 2020-01-28 LAB — TYPE AND SCREEN
ABO/RH(D): O POS
Antibody Screen: NEGATIVE
Unit division: 0
Unit division: 0

## 2020-01-28 LAB — BPAM RBC
Blood Product Expiration Date: 202112212359
Blood Product Expiration Date: 202112302359
ISSUE DATE / TIME: 202111231005
ISSUE DATE / TIME: 202111231226
Unit Type and Rh: 5100
Unit Type and Rh: 5100

## 2020-02-03 ENCOUNTER — Encounter: Payer: Self-pay | Admitting: Psychology

## 2020-02-03 ENCOUNTER — Telehealth: Payer: Self-pay | Admitting: Neurology

## 2020-02-03 NOTE — Telephone Encounter (Signed)
I spoke to [place] and cancelled appt. She said that she never heard from Dr. Ferne Coe office. Hinton Dyer is going to call them to get her scheduled. Once PT is scheduled for memory test she will call us back to make a f/u with Dr. Jaynee Eagles.

## 2020-02-03 NOTE — Telephone Encounter (Signed)
I referred patient to Dr. Sima Matas for formal memory testing and therapy. Doesn't look like she went to see him. Would you call patient and ask her why and give her Dr. Ferne Coe number to call please? I prefer to see her after testing otherwise my hands are tied as far as diagnosis and therapy. Ask her to see Dr. Sima Matas nad follow up with [place] afterwards as needed. Cancel appointment. If she had difficulty getting to Dr. Sima Matas have Hinton Dyer look into it thanks thanks.

## 2020-02-03 NOTE — Telephone Encounter (Signed)
Patient is scheduled to see Dr. Sima Matas  05/13/2019 at 9 am  Arrive at 8:30 . Called and Remedied . Patient of her apt date and time . Patient has to have testing done before it would be better for Dr. Jaynee Eagles. Patient is also on CX list as well.

## 2020-02-04 ENCOUNTER — Ambulatory Visit: Payer: 59 | Admitting: Neurology

## 2020-02-06 ENCOUNTER — Encounter: Payer: Self-pay | Admitting: Hematology and Oncology

## 2020-02-06 ENCOUNTER — Other Ambulatory Visit: Payer: Self-pay

## 2020-02-06 ENCOUNTER — Inpatient Hospital Stay: Payer: 59

## 2020-02-06 ENCOUNTER — Inpatient Hospital Stay: Payer: 59 | Attending: Hematology and Oncology | Admitting: Hematology and Oncology

## 2020-02-06 VITALS — BP 132/88 | HR 72 | Temp 98.3°F | Resp 19 | Ht 65.75 in | Wt 173.3 lb

## 2020-02-06 DIAGNOSIS — R002 Palpitations: Secondary | ICD-10-CM | POA: Diagnosis not present

## 2020-02-06 DIAGNOSIS — D75838 Other thrombocytosis: Secondary | ICD-10-CM | POA: Insufficient documentation

## 2020-02-06 DIAGNOSIS — Z86711 Personal history of pulmonary embolism: Secondary | ICD-10-CM | POA: Diagnosis not present

## 2020-02-06 DIAGNOSIS — R079 Chest pain, unspecified: Secondary | ICD-10-CM | POA: Insufficient documentation

## 2020-02-06 DIAGNOSIS — R0602 Shortness of breath: Secondary | ICD-10-CM | POA: Diagnosis not present

## 2020-02-06 DIAGNOSIS — Z79899 Other long term (current) drug therapy: Secondary | ICD-10-CM | POA: Diagnosis not present

## 2020-02-06 DIAGNOSIS — D500 Iron deficiency anemia secondary to blood loss (chronic): Secondary | ICD-10-CM | POA: Diagnosis not present

## 2020-02-06 DIAGNOSIS — N920 Excessive and frequent menstruation with regular cycle: Secondary | ICD-10-CM

## 2020-02-06 LAB — COMPREHENSIVE METABOLIC PANEL
ALT: 8 U/L (ref 0–44)
AST: 11 U/L — ABNORMAL LOW (ref 15–41)
Albumin: 4 g/dL (ref 3.5–5.0)
Alkaline Phosphatase: 47 U/L (ref 38–126)
Anion gap: 6 (ref 5–15)
BUN: 11 mg/dL (ref 6–20)
CO2: 26 mmol/L (ref 22–32)
Calcium: 9.4 mg/dL (ref 8.9–10.3)
Chloride: 107 mmol/L (ref 98–111)
Creatinine, Ser: 0.72 mg/dL (ref 0.44–1.00)
GFR, Estimated: >60 mL/min (ref >60)
Glucose, Bld: 90 mg/dL (ref 70–99)
Potassium: 3.7 mmol/L (ref 3.5–5.1)
Sodium: 139 mmol/L (ref 135–145)
Total Bilirubin: 0.2 mg/dL — ABNORMAL LOW (ref 0.3–1.2)
Total Protein: 7.1 g/dL (ref 6.5–8.1)

## 2020-02-06 LAB — CBC WITH DIFFERENTIAL/PLATELET
Abs Immature Granulocytes: 0.01 10*3/uL (ref 0.00–0.07)
Basophils Absolute: 0.1 10*3/uL (ref 0.0–0.1)
Basophils Relative: 1 %
Eosinophils Absolute: 0.1 10*3/uL (ref 0.0–0.5)
Eosinophils Relative: 2 %
HCT: 30.9 % — ABNORMAL LOW (ref 36.0–46.0)
Hemoglobin: 8.9 g/dL — ABNORMAL LOW (ref 12.0–15.0)
Immature Granulocytes: 0 %
Lymphocytes Relative: 31 %
Lymphs Abs: 1.6 10*3/uL (ref 0.7–4.0)
MCH: 19.6 pg — ABNORMAL LOW (ref 26.0–34.0)
MCHC: 28.8 g/dL — ABNORMAL LOW (ref 30.0–36.0)
MCV: 68.2 fL — ABNORMAL LOW (ref 80.0–100.0)
Monocytes Absolute: 0.4 10*3/uL (ref 0.1–1.0)
Monocytes Relative: 7 %
Neutro Abs: 3 10*3/uL (ref 1.7–7.7)
Neutrophils Relative %: 59 %
Platelets: 498 10*3/uL — ABNORMAL HIGH (ref 150–400)
RBC: 4.53 MIL/uL (ref 3.87–5.11)
RDW: 22.5 % — ABNORMAL HIGH (ref 11.5–15.5)
WBC: 5.2 10*3/uL (ref 4.0–10.5)
nRBC: 0 % (ref 0.0–0.2)

## 2020-02-06 LAB — TYPE AND SCREEN
ABO/RH(D): O POS
Antibody Screen: NEGATIVE

## 2020-02-06 LAB — RETICULOCYTES
Immature Retic Fract: 36.9 % — ABNORMAL HIGH (ref 2.3–15.9)
RBC.: 4.5 MIL/uL (ref 3.87–5.11)
Retic Count, Absolute: 21.6 10*3/uL (ref 19.0–186.0)
Retic Ct Pct: 0.5 % (ref 0.4–3.1)

## 2020-02-06 LAB — TECHNOLOGIST SMEAR REVIEW

## 2020-02-06 NOTE — Progress Notes (Signed)
[place] NOTE  Patient Care Team: Laurey Morale, MD as PCP - General  CHIEF COMPLAINTS/PURPOSE OF CONSULTATION:  IDA  ASSESSMENT & PLAN:   No problem-specific Assessment & Plan notes found for this encounter.  Orders Placed This Encounter  Procedures  . CBC with Differential/Platelet    Standing Status:   Standing    Number of Occurrences:   22    Standing Expiration Date:   02/05/2021  . Comprehensive metabolic panel    Standing Status:   Standing    Number of Occurrences:   33    Standing Expiration Date:   02/05/2021  . Reticulocytes    Standing Status:   Future    Standing Expiration Date:   02/05/2021  . Technologist smear review    Standing Status:   Future    Standing Expiration Date:   02/05/2021  . Type and screen   This is a very pleasant 49 year old female patient with severe iron deficiency anemia referred to hematology for additional recommendations.  Patient is a good historian, complains of heavy menstrual cycles with dementia since age of 26, last 2 months without any menstrual bleeding, she has been working with her gynecologist for management of menorrhagia.  She has had some adenomatous polyps in her colon identified during her colonoscopy early 2021 but no clear evidence of bleeding according to patient.  She has however  a follow-up appointment with her gastroenterologist and they are considering doing an endoscopy and colonoscopy for evaluation of severe iron deficiency anemia. Physical examination, some pallor noted, otherwise no palpable lymphadenopathy or hepatosplenomegaly. This is a case of severe iron deficiency anemia most likely secondary to menstrual blood loss platelet count GI work-up to rule out blood loss from a gastrointestinal source. I understand she has had iron infusions and blood transfusions from time to time so I also believe there is a component of malabsorption along with the blood loss.  I do not suspect any  evidence of hemolysis in this case.  I ordered a CBC, CMP, reticulocyte count today since patient was worried about hemolytic anemia.  At this time, she may need both blood transfusion and iron infusion depending on her hemoglobin today in preparation for surgery.  Previously Feraheme was denied by [place] [facility] hence we will try to proceed with Venofer which was the preferred formulary.  She has tolerated iron well in the past without any complications. I plan to administer weekly Venofer for about 6 weeks given severe iron deficiency and anemia along with possibly a couple units of packed red blood cells if she has to undergo surgery as planned at the end of December to keep her hemoglobin close to 10 g/dL. She is in agreement with all this plan.  She may need multiple iron infusions before her ferritin increases to a remarkable range given the severe anemia and usually when this happens,  iron therapy administered is going to be initially used in making of red blood cells and hence to improve ferritin to a significant range, we may have to readminister iron to fill up the reserves. She will follow-up with me in about 6 to 8 weeks and we will arrange for repeat labs at that time. I will also send a message to tentatively arrange for transfusion appointments and infusion appointments. Secondary thrombocytosis, likely from iron deficiency. Thank you for consulting [place] in the care of this patient.  Please do not hesitate to contact us with any additional questions or  concerns  HISTORY OF PRESENTING ILLNESS:  [name] 49 y.o. female is here because of severe anemia disproportionate to the menstrual blood loss.  This is a very pleasant 49 year old female patient, excellent historian referred to hematology for recommendations regarding persistent and severe iron deficiency anemia.  Patient complains of heavy menstrual cycles all her life since the age of 43, sometimes she drenches through a pad  every 1-2 hours and this happens every month and last for 5 days.  She has not had a cycle in the past 2 months since she got norethindrone implant.  Outside menstrual blood loss, she denies any hematochezia or melena but has had a colonoscopy earlier this year which demonstrated about 7-8 adenomatous polyps, diverticulitis according to patient's verbal report and has a follow-up with her gastroenterologist this month for consideration of repeat endoscopy and colonoscopy.  She does have family history of colon cancer in her maternal grandmother in 44s or 22s, mom also has some abdominal issues but no colon cancer.  No definitive evidence of familial adenomatous polyposis in the family. She tells me that she has had blood transfusion recently which helped her but that she does not respond to oral iron supplementation, IV iron does not tend to help, does not tend to push her ferritin any higher.  She has been feeling more and more shortness of breath, chest pain which is a constant vague pain on the left side of her chest, palpitations, has been eating and craving ice and has been feeling really tired.  She has been taking a long time to even walk on flat grounds and took about 3 days to start progressing history which is very unlike her.  She is hoping for hysterectomy soon which is planned for the end of December but waiting some improvement of hemoglobin to proceed with surgery based on anesthesia recommendations. She also had a history of PE after an elective surgery for breast reduction about 21 years ago, went on anticoagulation for a year, no other episodes of DVT or PE.  No peripartum or postpartum VTE. Rest of the pertinent 10 point ROS reviewed and negative.  REVIEW OF SYSTEMS:   Constitutional: Denies fevers, chills or abnormal night sweats.  Rest as mentioned in HPI Eyes: Denies blurriness of vision, double vision or watery eyes Ears, nose, mouth, throat, and face: Denies mucositis or sore  throat Respiratory: Denies cough, dyspnea or wheezes Cardiovascular: Complains of palpitations, vague chest pain on the left side Gastrointestinal:  Denies nausea, heartburn or change in bowel habits Skin: Denies abnormal skin rashes Lymphatics: Denies new lymphadenopathy or easy bruising Neurological:Denies numbness, tingling or new weaknesses Behavioral/Psych: Mood is stable, no new changes  All other systems were reviewed with the patient and are negative.  MEDICAL HISTORY:  Past Medical History:  Diagnosis Date  . Anemia    iron deficiency   . Cardiac arrhythmia   . Cellulitis   . Chronic headaches   . Chronic lumbar pain since 2013   sees Dr. Arvilla Market   . Clotting disorder ([place])    PE prior to pregnancy.  . Interstitial cystitis   . Palpitations 02/19/2018  . PE (pulmonary embolism)    due to surgery  . Sepsis ([place])    due to surgery  . Staph infection     SURGICAL HISTORY: Past Surgical History:  Procedure Laterality Date  . BREAST REDUCTION SURGERY    . COLONOSCOPY  06/25/2019   per Dr. Havery Moros, adeomatous polyps, repeat in  3 yrs   . EXPLORATORY LAPAROTOMY     with left ovary and mass removed  . RADIOFREQUENCY ABLATION NERVES     lumbar spine, per Dr. Ace Gins   . TUBAL LIGATION      SOCIAL HISTORY: Social History   Socioeconomic History  . Marital status: Married    Spouse name: Not on file  . Number of children: 3  . Years of education: Not on file  . Highest education level: Not on file  Occupational History  . Occupation: Glass blower/designer  Tobacco Use  . Smoking status: Never Smoker  . Smokeless tobacco: Never Used  . Tobacco comment: age 74  Vaping Use  . Vaping Use: Never used  Substance and Sexual Activity  . Alcohol use: Yes    Alcohol/week: 0.0 standard drinks    Comment: glass of wine occasionally  . Drug use: No  . Sexual activity: Yes    Partners: Male    Birth control/protection: None  Other Topics Concern  . Not on file   Social History Narrative   Lives at home with husband and 2 children   Right handed   Caffeine: 1-2 cups of coffee per day   Social Determinants of Health   Financial Resource Strain:   . Difficulty of Paying Living Expenses: Not on file  Food Insecurity:   . Worried About Charity fundraiser in the Last Year: Not on file  . Ran Out of Food in the Last Year: Not on file  Transportation Needs:   . Lack of Transportation (Medical): Not on file  . Lack of Transportation (Non-Medical): Not on file  Physical Activity:   . Days of Exercise per Week: Not on file  . Minutes of Exercise per Session: Not on file  Stress:   . Feeling of Stress : Not on file  Social Connections:   . Frequency of Communication with Friends and Family: Not on file  . Frequency of Social Gatherings with Friends and Family: Not on file  . Attends Religious Services: Not on file  . Active Member of Clubs or Organizations: Not on file  . Attends Archivist Meetings: Not on file  . Marital Status: Not on file  Intimate Partner Violence:   . Fear of Current or Ex-Partner: Not on file  . Emotionally Abused: Not on file  . Physically Abused: Not on file  . Sexually Abused: Not on file    FAMILY HISTORY: Family History  Problem Relation Age of Onset  . Colon polyps Mother   . Irritable bowel syndrome Mother   . Heart defect Mother        tachycardia  . Colon polyps Paternal Grandmother   . Hypertension Paternal Grandmother   . Heart defect Paternal Grandmother   . Heart attack Paternal Grandmother   . Colon polyps Maternal Grandmother   . Hypertension Maternal Grandmother   . Diabetes Paternal Grandfather   . Heart disease Paternal Grandfather   . Hypertension Paternal Grandfather   . Heart attack Paternal Grandfather   . Diabetes Maternal Grandfather   . Heart disease Maternal Grandfather   . Hypertension Maternal Grandfather   . Diabetes Sister   . Hypertension Sister   . Heart Problems  Sister   . Irritable bowel syndrome Paternal Aunt   . Cancer Paternal Aunt        melanoma  . Liver disease Father   . Hypertension Father   . COPD Father   . Colon cancer  Neg Hx   . Esophageal cancer Neg Hx   . Rectal cancer Neg Hx   . Stomach cancer Neg Hx     ALLERGIES:  has No Known Allergies.  MEDICATIONS:  Current Outpatient Medications  Medication Sig Dispense Refill  . Cyanocobalamin (B-12 SL) Place under the tongue daily.     . cyclobenzaprine (FLEXERIL) 10 MG tablet Take 1 tablet (10 mg total) by mouth 3 (three) times daily as needed for muscle spasms. 60 tablet 2  . gabapentin (NEURONTIN) 300 MG capsule Take 300 mg by mouth 3 (three) times daily.    Marland Kitchen ibuprofen (ADVIL,MOTRIN) 200 MG tablet Take 200 mg by mouth as needed.    . iron dextran complex (INFED) 50 MG/ML injection Inject 2 mLs (100 mg total) into the muscle daily. 20 mL 2  . norethindrone (MICRONOR) 0.35 MG tablet Take 1 tablet (0.35 mg total) by mouth daily. 56 tablet 0  . traZODone (DESYREL) 50 MG tablet Take 1-2 tablets (50-100 mg total) by mouth at bedtime. 60 tablet 3  . Fremanezumab-vfrm (AJOVY) 225 MG/1.5ML SOAJ Inject 225 mg into the skin every 30 (thirty) days. (Patient not taking: Reported on 02/06/2020) 3 pen 11  . Rimegepant Sulfate (NURTEC) 75 MG TBDP Take 75 mg by mouth every other day. For migraines. Take as close to onset of migraine as possible. One daily maximum. (Patient not taking: Reported on 02/06/2020) 18 tablet 6   Current Facility-Administered Medications  Medication Dose Route Frequency Provider Last Rate Last Admin  . 0.9 %  sodium chloride infusion  500 mL Intravenous Continuous Armbruster, Carlota Raspberry, MD      . 0.9 %  sodium chloride infusion  500 mL Intravenous Once Armbruster, Carlota Raspberry, MD         PHYSICAL EXAMINATION: ECOG PERFORMANCE STATUS: 2 - Symptomatic, <50% confined to bed  Vitals:   02/06/20 1107  BP: 132/88  Pulse: 72  Resp: 19  Temp: 98.3 F (36.8 C)  SpO2: 100%    Filed Weights   02/06/20 1107  Weight: 173 lb 4.8 oz (78.6 kg)    GENERAL:alert, no distress and comfortable SKIN: skin color, texture, turgor are normal, no rashes or significant lesions EYES: normal, conjunctiva are pale, sclera clear.  No icterus OROPHARYNX:no exudate, no erythema and lips, buccal mucosa, and tongue normal  NECK: supple, thyroid normal size, non-tender, without nodularity LYMPH:  no palpable lymphadenopathy in the cervical, axillary or inguinal LUNGS: clear to auscultation and percussion with normal breathing effort HEART: regular rate & rhythm  ABDOMEN:abdomen soft, non-tender and normal bowel sounds Musculoskeletal:no cyanosis of digits and no clubbing.  No bilateral lower extremity edema PSYCH: alert & oriented x 3 with fluent speech NEURO: no focal motor/sensory deficits  LABORATORY DATA:  I have reviewed the data as listed Lab Results  Component Value Date   WBC 6.3 01/19/2020   HGB 6.8 (L) 01/19/2020   HCT 26.5 (L) 01/19/2020   MCV 64.5 (L) 01/19/2020   PLT 506 (H) 01/19/2020     Chemistry      Component Value Date/Time   NA 137 01/08/2020 1043   K 3.9 01/08/2020 1043   CL 104 01/08/2020 1043   CO2 24 01/08/2020 1043   BUN 11 01/08/2020 1043   CREATININE 0.70 01/08/2020 1043   CREATININE 0.64 09/17/2019 1554      Component Value Date/Time   CALCIUM 9.7 01/08/2020 1043   ALKPHOS 46 12/15/2018 1829   AST 13 09/17/2019 1554   ALT 13  09/17/2019 1554   BILITOT 0.2 09/17/2019 1554      RADIOGRAPHIC STUDIES: I have personally reviewed the radiological images as listed and agreed with the findings in the report. DG Chest 2 View  Result Date: 01/08/2020 CLINICAL DATA:  Ongoing chest pain for 1 month increased in last few nights, chest pain causes shortness of breath especially with walking, heart pounds EXAM: CHEST - 2 VIEW COMPARISON:  02/08/2019 FINDINGS: Normal heart size, mediastinal contours, and pulmonary vascularity. Lungs clear. No  pleural effusion or pneumothorax. Bones unremarkable. IMPRESSION: Normal exam. Electronically Signed   By: Lavonia Dana M.D.   On: 01/08/2020 10:59   CT Angio Chest PE W and/or Wo Contrast  Result Date: 01/08/2020 CLINICAL DATA:  Chest pain and shortness of breath. EXAM: CT ANGIOGRAPHY CHEST WITH CONTRAST TECHNIQUE: Multidetector CT imaging of the chest was performed using the standard protocol during bolus administration of intravenous contrast. Multiplanar CT image reconstructions and MIPs were obtained to evaluate the vascular anatomy. CONTRAST:  72mL OMNIPAQUE IOHEXOL 350 MG/ML SOLN COMPARISON:  Chest radiographs obtained earlier today. Chest CTA dated 09/04/2006. FINDINGS: Cardiovascular: Satisfactory opacification of the pulmonary arteries to the segmental level. No evidence of pulmonary embolism. Normal heart size. No pericardial effusion. Mediastinum/Nodes: No enlarged mediastinal, hilar, or axillary lymph nodes. Thyroid gland, trachea, and esophagus demonstrate no significant findings. Lungs/Pleura: Small calcified granuloma in the lateral aspect of the right upper lobe. No airspace consolidation or pleural fluid. Upper Abdomen: Unremarkable. Musculoskeletal: Mild thoracic spine degenerative changes. Review of the MIP images confirms the above findings. IMPRESSION: No pulmonary emboli or acute abnormality. Electronically Signed   By: Claudie Revering M.D.   On: 01/08/2020 14:48   I have reviewed all labs  Back in 2019, she was found to have severe iron deficiency, borderline B12 levels, normal folate levels. Again most recent labs from 2021, show dropping Hb, severe microcytosis, hypochromia, high platelet count. Consistent with iron deficiency anemia with secondary thrombocytosis   Time  I spent a total of 45 minutes in the care of this patient including history and physical, review of medical records, counseling and coordination of care for    Benay Pike, MD 02/06/2020 12:08 PM

## 2020-02-09 ENCOUNTER — Other Ambulatory Visit: Payer: Self-pay | Admitting: Obstetrics & Gynecology

## 2020-02-10 ENCOUNTER — Telehealth: Payer: Self-pay | Admitting: Hematology and Oncology

## 2020-02-10 NOTE — Telephone Encounter (Signed)
Scheduled appointments per 12/3 los. Called patient, no answer. Left message for patient with appointment date and time. Will schedule additional appointments once I hear back a confirmation from patient.

## 2020-02-11 ENCOUNTER — Encounter: Payer: Self-pay | Admitting: *Deleted

## 2020-02-12 ENCOUNTER — Encounter: Payer: Self-pay | Admitting: Gastroenterology

## 2020-02-12 ENCOUNTER — Encounter: Payer: Self-pay | Admitting: Physician Assistant

## 2020-02-12 ENCOUNTER — Ambulatory Visit (INDEPENDENT_AMBULATORY_CARE_PROVIDER_SITE_OTHER): Payer: 59 | Admitting: Physician Assistant

## 2020-02-12 VITALS — BP 110/70 | HR 88 | Ht 65.75 in | Wt 173.0 lb

## 2020-02-12 DIAGNOSIS — D509 Iron deficiency anemia, unspecified: Secondary | ICD-10-CM

## 2020-02-12 NOTE — Progress Notes (Signed)
Chief Complaint: Anemia  HPI:    [name] is a 49 year old female with a past medical history of iron deficiency anemia, PE and others listed below, known to Dr. Havery Moros, who was referred to me by Laurey Morale, MD for a complaint of anemia.    10/12/2015 EGD with gastritis and otherwise normal.  Patient started on omeprazole.    06/25/2019 colonoscopy done for personal history of 7 or more polyps, with a single colonic angiodysplastic lesion, 1 3 mm polyp at the hepatic flexure, 2 3-4 mm polyps in the sigmoid colon and diverticulosis in the sigmoid colon as well as a tortuous colon overall.  Pathology showed tubular adenomas and repeat recommended in 3 years.    02/06/2020 patient seen at the cancer center for her iron deficiency anemia.  At that time described heavy menstrual cycles since the age of 73, she not had any bleeding in the last 2 months and been working with her gynecologist for management of menorrhagia.  Describes some polyps in her colon to her colonoscopy early 2021 but no clear evidence of bleeding.  She has been getting both iron and blood infusions/transfusion.  At that time was put on weekly Venofer for 6 weeks.    Today, the patient presents to clinic and explains that since 2019 she has been profoundly anemic requiring multiple blood transfusion and iron infusions.  They always thought this was related to her menorrhagia and have tried to schedule her for hysterectomy multiple times but this has never been able to be done for various reasons.  She is currently scheduled again for late December and hopes that they will be able to do it, but she was following with her OB/GYN lately and had not had menses for 2 months and her hemoglobin dropped to whole point regardless of this.  They then became suspicious and sent her to a hematologist, as above, who recommends a full GI work-up.  Patient tells me that she is getting some side effects from the severe anemia now including  dizziness etc.    Works as the Oncologist clinic nearby.  She has 3 grown children, the youngest is 78 about to graduate from high school this coming year.  Married to her high school sweet heart.  They have been together since they were 47 years old.    Denies fever, chills or weight loss.  Past Medical History:  Diagnosis Date  . Anemia    iron deficiency   . Cardiac arrhythmia   . Cellulitis   . Chronic headaches   . Chronic lumbar pain since 2013   sees Dr. Arvilla Market   . Clotting disorder ([place])    PE prior to pregnancy.  . Interstitial cystitis   . Palpitations 02/19/2018  . PE (pulmonary embolism)    due to surgery  . Sepsis ([place])    due to surgery  . Staph infection     Past Surgical History:  Procedure Laterality Date  . BREAST REDUCTION SURGERY    . COLONOSCOPY  06/25/2019   per Dr. Havery Moros, adeomatous polyps, repeat in 3 yrs   . EXPLORATORY LAPAROTOMY     with left ovary and mass removed  . RADIOFREQUENCY ABLATION NERVES     lumbar spine, per Dr. Ace Gins   . TUBAL LIGATION      Current Outpatient Medications  Medication Sig Dispense Refill  . acetaminophen (TYLENOL) 500 MG tablet Take 1,000 mg by mouth every 8 (eight) hours as  needed for moderate pain.    . cholecalciferol (VITAMIN D3) 25 MCG (1000 UNIT) tablet Take 1,000 Units by mouth daily.    . cyclobenzaprine (FLEXERIL) 10 MG tablet Take 1 tablet (10 mg total) by mouth 3 (three) times daily as needed for muscle spasms. 60 tablet 2  . Fremanezumab-vfrm (AJOVY) 225 MG/1.5ML SOAJ Inject 225 mg into the skin every 30 (thirty) days. (Patient not taking: Reported on 02/06/2020) 3 pen 11  . gabapentin (NEURONTIN) 300 MG capsule Take 300 mg by mouth 3 (three) times daily as needed (pain).     Marland Kitchen ibuprofen (ADVIL,MOTRIN) 200 MG tablet Take 600 mg by mouth every 8 (eight) hours as needed for moderate pain.     . iron dextran complex (INFED) 50 MG/ML injection Inject 2 mLs (100 mg total) into the muscle  daily. (Patient not taking: Reported on 02/10/2020) 20 mL 2  . norethindrone (MICRONOR) 0.35 MG tablet TAKE 1 TABLET BY MOUTH EVERY DAY (Patient taking differently: Take 1 tablet by mouth daily. ) 28 tablet 1  . Rimegepant Sulfate (NURTEC) 75 MG TBDP Take 75 mg by mouth every other day. For migraines. Take as close to onset of migraine as possible. One daily maximum. (Patient not taking: Reported on 02/06/2020) 18 tablet 6  . traZODone (DESYREL) 50 MG tablet Take 1-2 tablets (50-100 mg total) by mouth at bedtime. (Patient taking differently: Take 50-100 mg by mouth at bedtime as needed for sleep. ) 60 tablet 3  . vitamin B-12 (CYANOCOBALAMIN) 1000 MCG tablet Take 1,000 mcg by mouth daily.     Current Facility-Administered Medications  Medication Dose Route Frequency Provider Last Rate Last Admin  . 0.9 %  sodium chloride infusion  500 mL Intravenous Continuous Armbruster, Carlota Raspberry, MD      . 0.9 %  sodium chloride infusion  500 mL Intravenous Once Armbruster, Carlota Raspberry, MD        Allergies as of 02/12/2020  . (No Known Allergies)    Family History  Problem Relation Age of Onset  . Colon polyps Mother   . Irritable bowel syndrome Mother   . Heart defect Mother        tachycardia  . Colon polyps Paternal Grandmother   . Hypertension Paternal Grandmother   . Heart defect Paternal Grandmother   . Heart attack Paternal Grandmother   . Colon polyps Maternal Grandmother   . Hypertension Maternal Grandmother   . Diabetes Paternal Grandfather   . Heart disease Paternal Grandfather   . Hypertension Paternal Grandfather   . Heart attack Paternal Grandfather   . Diabetes Maternal Grandfather   . Heart disease Maternal Grandfather   . Hypertension Maternal Grandfather   . Diabetes Sister   . Hypertension Sister   . Heart Problems Sister   . Irritable bowel syndrome Paternal Aunt   . Cancer Paternal Aunt        melanoma  . Liver disease Father   . Hypertension Father   . COPD Father   .  Colon cancer Neg Hx   . Esophageal cancer Neg Hx   . Rectal cancer Neg Hx   . Stomach cancer Neg Hx     Social History   Socioeconomic History  . Marital status: Married    Spouse name: Not on file  . Number of children: 3  . Years of education: Not on file  . Highest education level: Not on file  Occupational History  . Occupation: Glass blower/designer  Tobacco Use  . Smoking  status: Never Smoker  . Smokeless tobacco: Never Used  . Tobacco comment: age 39  Vaping Use  . Vaping Use: Never used  Substance and Sexual Activity  . Alcohol use: Yes    Alcohol/week: 0.0 standard drinks    Comment: glass of wine occasionally  . Drug use: No  . Sexual activity: Yes    Partners: Male    Birth control/protection: None  Other Topics Concern  . Not on file  Social History Narrative   Lives at home with husband and 2 children   Right handed   Caffeine: 1-2 cups of coffee per day   Social Determinants of Health   Financial Resource Strain: Not on file  Food Insecurity: Not on file  Transportation Needs: Not on file  Physical Activity: Not on file  Stress: Not on file  Social Connections: Not on file  Intimate Partner Violence: Not on file    Review of Systems:    Constitutional: No weight loss, fever or chills Cardiovascular: No chest pain Respiratory: No SOB  Gastrointestinal: See HPI and otherwise negative   Physical Exam:  Vital signs: BP 110/70 (BP Location: Left Arm, Patient Position: Sitting, Cuff Size: Normal)   Pulse 88   Ht 5' 5.75" (1.67 m)   Wt 173 lb (78.5 kg)   SpO2 100%   BMI 28.14 kg/m   Constitutional:   Very Pleasant Caucasian female appears to be in NAD, Well developed, Well nourished, alert and cooperative Respiratory: Respirations even and unlabored. Lungs clear to auscultation bilaterally.   No wheezes, crackles, or rhonchi.  Cardiovascular: Normal S1, S2. No MRG. Regular rate and rhythm. No peripheral edema, cyanosis or pallor.  Gastrointestinal:   Soft, nondistended, nontender. No rebound or guarding. Normal bowel sounds. No appreciable masses or hepatomegaly. Rectal:  Not performed.  Psychiatric:Demonstrates good judgement and reason without abnormal affect or behaviors.  RELEVANT LABS AND IMAGING: CBC    Component Value Date/Time   WBC 5.2 02/06/2020 1221   RBC 4.50 02/06/2020 1234   RBC 4.53 02/06/2020 1221   HGB 8.9 (L) 02/06/2020 1221   HCT 30.9 (L) 02/06/2020 1221   PLT 498 (H) 02/06/2020 1221   MCV 68.2 (L) 02/06/2020 1221   MCH 19.6 (L) 02/06/2020 1221   MCHC 28.8 (L) 02/06/2020 1221   RDW 22.5 (H) 02/06/2020 1221   LYMPHSABS 1.6 02/06/2020 1221   MONOABS 0.4 02/06/2020 1221   EOSABS 0.1 02/06/2020 1221   BASOSABS 0.1 02/06/2020 1221    CMP     Component Value Date/Time   NA 139 02/06/2020 1221   K 3.7 02/06/2020 1221   CL 107 02/06/2020 1221   CO2 26 02/06/2020 1221   GLUCOSE 90 02/06/2020 1221   BUN 11 02/06/2020 1221   CREATININE 0.72 02/06/2020 1221   CREATININE 0.64 09/17/2019 1554   CALCIUM 9.4 02/06/2020 1221   PROT 7.1 02/06/2020 1221   ALBUMIN 4.0 02/06/2020 1221   AST 11 (L) 02/06/2020 1221   ALT 8 02/06/2020 1221   ALKPHOS 47 02/06/2020 1221   BILITOT 0.2 (L) 02/06/2020 1221   GFRNONAA >60 02/06/2020 1221   GFRAA >60 12/15/2018 1829    Assessment: 1.  Iron deficiency anemia: Recent colonoscopy this year with 1 angiodysplastic lesion and polyps and otherwise normal, last EGD in 2017, thought related to menorrhagia in the past but has not had a period for 2 months now and continues to have drop in hemoglobin; consider malabsorption versus GI blood loss versus other  Plan: 1.  Scheduled patient for an EGD in the [place] with Dr. Havery Moros.  He added her onto a 7:30 slot on 12/14so we can get this done before hemoglobin hypothetically drops again.  Patient was given a detailed handout regarding risks of the procedure and agrees to proceed. 2.  Continue to follow with hematologist in regards to  iron infusion/blood infusions. 3.  Patient will likely need to be set up for a pill capsule endoscopy after time of EGD. 4.  Patient to follow in clinic per recommendations from Dr. Havery Moros after time of procedure.  Ellouise Newer, PA-C [facility] 02/12/2020, 9:33 AM  Cc: Laurey Morale, MD

## 2020-02-12 NOTE — Patient Instructions (Signed)
If you are age 47 or older, your body mass index should be between 23-30. Your Body mass index is 28.14 kg/m. If this is out of the aforementioned range listed, please consider follow up with your Primary Care Provider.  If you are age 27 or younger, your body mass index should be between 19-25. Your Body mass index is 28.14 kg/m. If this is out of the aformentioned range listed, please consider follow up with your Primary Care Provider.   You have been scheduled for an endoscopy. Please follow written instructions given to you at your visit today. If you use inhalers (even only as needed), please bring them with you on the day of your procedure.  Thank you for choosing me and [facility].  Ellouise Newer, PA-C

## 2020-02-12 NOTE — Progress Notes (Signed)
Agree with assessment and plan as outlined.  Colonoscopy without any high risk pathology to cause this level of anemia.  Agree with upper endoscopy and if negative would pursue capsule endoscopy if anemia persists/recurs despite management of menorrhagia.

## 2020-02-13 ENCOUNTER — Other Ambulatory Visit: Payer: Self-pay | Admitting: Gastroenterology

## 2020-02-13 ENCOUNTER — Other Ambulatory Visit: Payer: Self-pay

## 2020-02-13 ENCOUNTER — Inpatient Hospital Stay: Payer: 59

## 2020-02-13 VITALS — BP 110/69 | HR 67 | Temp 98.6°F | Resp 17 | Ht 66.0 in | Wt 172.5 lb

## 2020-02-13 DIAGNOSIS — D500 Iron deficiency anemia secondary to blood loss (chronic): Secondary | ICD-10-CM | POA: Diagnosis not present

## 2020-02-13 MED ORDER — IRON SUCROSE 20 MG/ML IV SOLN
200.0000 mg | Freq: Once | INTRAVENOUS | Status: AC
  Administered 2020-02-13: 200 mg via INTRAVENOUS
  Filled 2020-02-13: qty 200

## 2020-02-13 MED ORDER — SODIUM CHLORIDE 0.9 % IV SOLN
Freq: Once | INTRAVENOUS | Status: AC
  Filled 2020-02-13: qty 250

## 2020-02-13 NOTE — Patient Instructions (Signed)

## 2020-02-16 ENCOUNTER — Other Ambulatory Visit: Payer: Self-pay

## 2020-02-16 ENCOUNTER — Inpatient Hospital Stay: Payer: 59

## 2020-02-16 ENCOUNTER — Other Ambulatory Visit: Payer: Self-pay | Admitting: *Deleted

## 2020-02-16 ENCOUNTER — Inpatient Hospital Stay (HOSPITAL_COMMUNITY): Admission: RE | Admit: 2020-02-16 | Payer: 59 | Source: Ambulatory Visit

## 2020-02-16 DIAGNOSIS — D500 Iron deficiency anemia secondary to blood loss (chronic): Secondary | ICD-10-CM | POA: Diagnosis not present

## 2020-02-16 LAB — CBC WITH DIFFERENTIAL (CANCER CENTER ONLY)
Abs Immature Granulocytes: 0.01 10*3/uL (ref 0.00–0.07)
Basophils Absolute: 0.1 10*3/uL (ref 0.0–0.1)
Basophils Relative: 2 %
Eosinophils Absolute: 0.1 10*3/uL (ref 0.0–0.5)
Eosinophils Relative: 2 %
HCT: 30.4 % — ABNORMAL LOW (ref 36.0–46.0)
Hemoglobin: 8.8 g/dL — ABNORMAL LOW (ref 12.0–15.0)
Immature Granulocytes: 0 %
Lymphocytes Relative: 30 %
Lymphs Abs: 1.6 10*3/uL (ref 0.7–4.0)
MCH: 20 pg — ABNORMAL LOW (ref 26.0–34.0)
MCHC: 28.9 g/dL — ABNORMAL LOW (ref 30.0–36.0)
MCV: 69.2 fL — ABNORMAL LOW (ref 80.0–100.0)
Monocytes Absolute: 0.4 10*3/uL (ref 0.1–1.0)
Monocytes Relative: 8 %
Neutro Abs: 3 10*3/uL (ref 1.7–7.7)
Neutrophils Relative %: 58 %
Platelet Count: 447 10*3/uL — ABNORMAL HIGH (ref 150–400)
RBC: 4.39 MIL/uL (ref 3.87–5.11)
RDW: 23.6 % — ABNORMAL HIGH (ref 11.5–15.5)
WBC Count: 5.2 10*3/uL (ref 4.0–10.5)
nRBC: 0 % (ref 0.0–0.2)

## 2020-02-17 ENCOUNTER — Other Ambulatory Visit: Payer: Self-pay | Admitting: *Deleted

## 2020-02-17 ENCOUNTER — Inpatient Hospital Stay: Payer: 59

## 2020-02-17 ENCOUNTER — Telehealth: Payer: Self-pay | Admitting: *Deleted

## 2020-02-17 ENCOUNTER — Ambulatory Visit (AMBULATORY_SURGERY_CENTER): Payer: 59 | Admitting: Gastroenterology

## 2020-02-17 ENCOUNTER — Ambulatory Visit: Payer: 59 | Admitting: Obstetrics & Gynecology

## 2020-02-17 ENCOUNTER — Encounter: Payer: Self-pay | Admitting: Gastroenterology

## 2020-02-17 ENCOUNTER — Other Ambulatory Visit: Payer: Self-pay

## 2020-02-17 VITALS — BP 114/66 | HR 77 | Temp 98.9°F | Resp 12 | Ht 65.75 in | Wt 173.0 lb

## 2020-02-17 DIAGNOSIS — D500 Iron deficiency anemia secondary to blood loss (chronic): Secondary | ICD-10-CM

## 2020-02-17 DIAGNOSIS — D509 Iron deficiency anemia, unspecified: Secondary | ICD-10-CM

## 2020-02-17 DIAGNOSIS — K2950 Unspecified chronic gastritis without bleeding: Secondary | ICD-10-CM

## 2020-02-17 DIAGNOSIS — B9681 Helicobacter pylori [H. pylori] as the cause of diseases classified elsewhere: Secondary | ICD-10-CM

## 2020-02-17 DIAGNOSIS — K2970 Gastritis, unspecified, without bleeding: Secondary | ICD-10-CM | POA: Diagnosis not present

## 2020-02-17 LAB — PREPARE RBC (CROSSMATCH)

## 2020-02-17 MED ORDER — SODIUM CHLORIDE 0.9 % IV SOLN
500.0000 mL | Freq: Once | INTRAVENOUS | Status: DC
Start: 2020-02-17 — End: 2020-02-17

## 2020-02-17 MED ORDER — SODIUM CHLORIDE 0.9% IV SOLUTION
250.0000 mL | Freq: Once | INTRAVENOUS | Status: DC
  Filled 2020-02-17: qty 250

## 2020-02-17 NOTE — Progress Notes (Signed)
AR - Check in   CW - VS   There is a note on the schedule to leave her IV in d/t having a blood transfusion today at 14:30.  This was brought to our Director, Joella Prince, she said that our policy will not allow [place] to leave the IV in unless taken by EMS to the ED.  Pt is aware, and she understands.  maw

## 2020-02-17 NOTE — Telephone Encounter (Signed)
-----   Message from Benay Pike, MD sent at 02/17/2020 12:46 PM EST ----- Regarding: RE: Transfusion Question Oh I see, lets do it. Sorry I am in a poor signal hike. Lets give her 1 unit PRBC today if possible. Dr Alen Blew is my pod partner if u cant get hold of me ----- Message ----- From: Wilmon Arms, RN Sent: 02/17/2020  12:22 PM EST To: Benay Pike, MD Subject: Transfusion Question                           Dr. Chryl Heck,  Trying a different way to relay telephone message hoping it gives you the ability to respond.  Patient was advised no blood transfusion is needed today and no iron today.  She is very concerned.  She states she understands that her hgb is considered stable enough to not warrant transfusion today.  However, her surgeon wants her Hgb to above 10 when she has surgery for hysterectomy on 03/03/2020.  Patient is concerned that her upcoming menstruation will also cause her to drop further which she expects any day now.    Patient is asking if transfusion would be granted despite her numbers being 8.8 so that can she ensure that her surgery isn't canceled as it has been canceled twice now because of unstable hemoglobin.  Also questioning frequency of iron infusions.  Treatment plan shows every 2 days.  Please confirm.  Thanks, Maudie Mercury

## 2020-02-17 NOTE — Op Note (Signed)
[facility] Patient Name: [name] Procedure Date: 02/17/2020 7:24 AM MRN: [id number] Endoscopist: Remo Lipps P. Havery [name] , MD Age: 49 Referring MD:  Date of Birth: [birth year] Gender: Female Account #: [id number] Procedure:                Upper GI endoscopy Indications:              Iron deficiency anemia - severe, leading to blood                            transfusion / IV iron, no overt bleeding, history                            of menorrhagia being treated however continued drop                            in Hgb, colonoscopy earlier this year without clear                            cause. Patient does endorse NSAID use Medicines:                Monitored Anesthesia Care Procedure:                Pre-Anesthesia Assessment:                           - Prior to the procedure, a History and Physical                            was performed, and patient medications and                            allergies were reviewed. The patient's tolerance of                            previous anesthesia was also reviewed. The risks                            and benefits of the procedure and the sedation                            options and risks were discussed with the patient.                            All questions were answered, and informed consent                            was obtained. Prior Anticoagulants: The patient has                            taken no previous anticoagulant or antiplatelet                            agents. ASA Grade Assessment: II - A patient with  mild systemic disease. After reviewing the risks                            and benefits, the patient was deemed in                            satisfactory condition to undergo the procedure.                           After obtaining informed consent, the endoscope was                            passed under direct vision. Throughout the                             procedure, the patient's blood pressure, pulse, and                            oxygen saturations were monitored continuously. The                            Endoscope was introduced through the mouth, and                            advanced to the second part of duodenum. The upper                            GI endoscopy was accomplished without difficulty.                            The patient tolerated the procedure well. Scope In: Scope Out: Findings:                 Esophagogastric landmarks were identified: the                            Z-line was found at 36 cm, the gastroesophageal                            junction was found at 36 cm and the upper extent of                            the gastric folds was found at 36 cm from the                            incisors.                           The exam of the esophagus was otherwise normal.                           Two localized diminutive erosions with no stigmata  of recent bleeding were found in the prepyloric                            region of the stomach. No focal ulcerations.                           The exam of the stomach was otherwise normal.                           Biopsies were taken with a cold forceps in the                            gastric body, at the incisura and in the gastric                            antrum for Helicobacter pylori testing.                           The duodenal bulb and second portion of the                            duodenum were normal. Biopsies for histology were                            taken with a cold forceps for evaluation of celiac                            disease. Complications:            No immediate complications. Estimated blood loss:                            Minimal. Estimated Blood Loss:     Estimated blood loss was minimal. Impression:               - Esophagogastric landmarks identified.                           - Normal esophagus                            - 2 diminutive erosions in the pre pyloric area, no                            stigmata of bleeding or ulcers noted                           - Normal stomach otherwise - biopsies taken to rule                            out H pylori                           - Normal duodenal bulb and second portion of the  duodenum. Biopsied.                           No overt cause of severe anemia on this exam, will                            await biopsy results. Recommendation:           - Patient has a contact number available for                            emergencies. The signs and symptoms of potential                            delayed complications were discussed with the                            patient. Return to normal activities tomorrow.                            Written discharge instructions were provided to the                            patient.                           - Resume previous diet.                           - Continue present medications.                           - Avoid NSAIDs if possible - can lead to erosions /                            ulcerations of the bowel                           - Await pathology results.                           - Consideration for capsule endoscopy to clear the                            small bowel pending patient's course and findings.                            Patient has pending hysterectomy later this month,                            also followed by hematology Remo Lipps P. Wanda Burich, MD 02/17/2020 7:58:13 AM This report has been signed electronically.

## 2020-02-17 NOTE — Patient Instructions (Signed)
Handout on polyps provided.   YOU HAD AN ENDOSCOPIC PROCEDURE TODAY AT [facility]:   Refer to the procedure report that was given to you for any specific questions about what was found during the examination.  If the procedure report does not answer your questions, please call your gastroenterologist to clarify.  If you requested that your care partner not be given the details of your procedure findings, then the procedure report has been included in a sealed envelope for you to review at your convenience later.  YOU SHOULD EXPECT: Some feelings of bloating in the abdomen. Passage of more gas than usual.  Walking can help get rid of the air that was put into your GI tract during the procedure and reduce the bloating. If you had a lower endoscopy (such as a colonoscopy or flexible sigmoidoscopy) you may notice spotting of blood in your stool or on the toilet paper. If you underwent a bowel prep for your procedure, you may not have a normal bowel movement for a few days.  Please Note:  You might notice some irritation and congestion in your nose or some drainage.  This is from the oxygen used during your procedure.  There is no need for concern and it should clear up in a day or so.  SYMPTOMS TO REPORT IMMEDIATELY:    Following upper endoscopy (EGD)  Vomiting of blood or coffee ground material  New chest pain or pain under the shoulder blades  Painful or persistently difficult swallowing  New shortness of breath  Fever of 100F or higher  Black, tarry-looking stools  For urgent or emergent issues, a gastroenterologist can be reached at any hour by calling [phone]. Do not use MyChart messaging for urgent concerns.    DIET:  We do recommend a small meal at first, but then you may proceed to your regular diet.  Drink plenty of fluids but you should avoid alcoholic beverages for 24 hours.  Medications: Resume previous medications. Avoid NSAIDS, if possible.   ACTIVITY:   You should plan to take it easy for the rest of today and you should NOT DRIVE or use heavy machinery until tomorrow (because of the sedation medicines used during the test).    FOLLOW UP: Our staff will call the number listed on your records 48-72 hours following your procedure to check on you and address any questions or concerns that you may have regarding the information given to you following your procedure. If we do not reach you, we will leave a message.  We will attempt to reach you two times.  During this call, we will ask if you have developed any symptoms of COVID 19. If you develop any symptoms (ie: fever, flu-like symptoms, shortness of breath, cough etc.) before then, please call [phone].  If you test positive for Covid 19 in the 2 weeks post procedure, please call and report this information to [place].    If any biopsies were taken you will be contacted by phone or by letter within the next 1-3 weeks.  Please call us at [phone] if you have not heard about the biopsies in 3 weeks.    SIGNATURES/CONFIDENTIALITY: You and/or your care partner have signed paperwork which will be entered into your electronic medical record.  These signatures attest to the fact that that the information above on your After Visit Summary has been reviewed and is understood.  Full responsibility of the confidentiality of this discharge information lies with you  and/or your care-partner. 

## 2020-02-17 NOTE — Progress Notes (Signed)
Called to room to assist during endoscopic procedure.  Patient ID and intended procedure confirmed with present staff. Received instructions for my participation in the procedure from the performing physician.  

## 2020-02-17 NOTE — Patient Instructions (Signed)

## 2020-02-17 NOTE — Progress Notes (Signed)
pt tolerated well. VSS. awake and to recovery. Report given to RN. Bite block inserted and removed without trauma. 

## 2020-02-18 LAB — TYPE AND SCREEN
ABO/RH(D): O POS
Antibody Screen: NEGATIVE
Unit division: 0

## 2020-02-18 LAB — BPAM RBC
Blood Product Expiration Date: 202201112359
ISSUE DATE / TIME: 202112141511
Unit Type and Rh: 5100

## 2020-02-19 ENCOUNTER — Encounter: Payer: Self-pay | Admitting: Obstetrics & Gynecology

## 2020-02-19 ENCOUNTER — Ambulatory Visit (INDEPENDENT_AMBULATORY_CARE_PROVIDER_SITE_OTHER): Payer: 59 | Admitting: Obstetrics & Gynecology

## 2020-02-19 ENCOUNTER — Other Ambulatory Visit: Payer: Self-pay

## 2020-02-19 ENCOUNTER — Telehealth: Payer: Self-pay | Admitting: *Deleted

## 2020-02-19 VITALS — BP 136/90

## 2020-02-19 DIAGNOSIS — D500 Iron deficiency anemia secondary to blood loss (chronic): Secondary | ICD-10-CM

## 2020-02-19 DIAGNOSIS — N920 Excessive and frequent menstruation with regular cycle: Secondary | ICD-10-CM | POA: Diagnosis not present

## 2020-02-19 DIAGNOSIS — D219 Benign neoplasm of connective and other soft tissue, unspecified: Secondary | ICD-10-CM | POA: Diagnosis not present

## 2020-02-19 NOTE — Telephone Encounter (Signed)
  Follow up Call-  Call back number 02/17/2020 06/25/2019  Post procedure Call Back phone  # [phone] cell [phone]  Permission to leave phone message Yes Yes  Some recent data might be hidden     Patient questions:  Do you have a fever, pain , or abdominal swelling? No. Pain Score  0 *  Have you tolerated food without any problems? Yes.    Have you been able to return to your normal activities? Yes.    Do you have any questions about your discharge instructions: Diet   No. Medications  No. Follow up visit  No.  Do you have questions or concerns about your Care? No.  Actions: * If pain score is 4 or above: No action needed, pain <4.  1. Have you developed a fever since your procedure? no  2.   Have you had an respiratory symptoms (SOB or cough) since your procedure? no  3.   Have you tested positive for COVID 19 since your procedure no  4.   Have you had any family members/close contacts diagnosed with the COVID 19 since your procedure?  no   If yes to any of these questions please route to Joylene John, RN and Joella Prince, RN

## 2020-02-19 NOTE — Progress Notes (Signed)
    MARCIA HARTWELL Jun 12, 1970 [id number]        49 y.o.  [id number]  Married   RP: Preop XI Robotic TLH/Bilateral Salpingectomy  HPI: No current vaginal bleeding on the Progestin-only pill.  Followed by Dr Sheran Spine.  Received IV Iron and had 3 U pRBC transfused last week.  Hb 8.8 on 12/13th.  Still experiencing heart palpitations and thoracic pain.  Will see Cardio 12/20th for preop clearance.   OB History  Gravida Para Term Preterm AB Living  4 3     1 3   SAB IAB Ectopic Multiple Live Births               # Outcome Date GA Lbr Len/2nd Weight Sex Delivery Anes PTL Lv  4 AB           3 Para           2 Para           1 Para             Past medical history,surgical history, problem list, medications, allergies, family history and social history were all reviewed and documented in the EPIC chart.   Directed ROS with pertinent positives and negatives documented in the history of present illness/assessment and plan.  Exam:  There were no vitals filed for this visit. General appearance:  Normal  Pelvic US 08/14/2019: Comparison is made with previous scan in 2019. T/V images. Anteverted enlarged uterus with multiple intramural and subserosal fibroids with no significant change in size of the uterus or fibroids since previous scan. The uterus is measured at 11.62 x 8.55 x 7.78 cm. The endometrial lining is normal and thinmeasured at 5.2 mm. The cavity is distorted by adjacent fibroids. No mass or thickening of the endometrium is seen. Both ovaries are normal in size with normal follicular pattern. No adnexal mass. No free fluid in the posterior cul-de-sac.   Assessment/Plan:  49 y.o. [id number]   1. Menorrhagia with regular cycle Menorrhagia associated with uterine fibroids.  Probably other factors contributing to anemia, followed by hematology.  Received IV iron.  Decision to proceed with an XI robotic total laparoscopic hysterectomy with bilateral salpingectomy.   Preop preparation, surgical procedure with risks and postop precautions and recommendations thoroughly reviewed with patient.  Will see cardiology to further investigate heart palpitations and clear for surgery.  2. Fibroids Pelvic ultrasound June 2021 revealed a myomatous uterus measured at 11.62 x 8.55 x 7.78 cm.  3. Anemia due to chronic blood loss Followed by hematology.  Anemia due to chronic blood loss and probably other factors under investigation currently.                        Patient was counseled as to the risk of surgery to include the following:  1. Infection (prohylactic antibiotics will be administered)  2. DVT/Pulmonary Embolism (prophylactic pneumo compression stockings will be used)  3.Trauma to internal organs requiring additional surgical procedure to repair any injury to internal organs requiring perhaps additional hospitalization days.  4.Hemmorhage requiring transfusion and blood products which carry risks such as anaphylactic reaction, hepatitis and AIDS  Patient had received literature information on the procedure scheduled and all her questions were answered and fully accepts all risk.  Princess Bruins MD, 1:45 PM 02/19/2020

## 2020-02-20 ENCOUNTER — Other Ambulatory Visit: Payer: Self-pay

## 2020-02-20 ENCOUNTER — Inpatient Hospital Stay: Payer: 59

## 2020-02-20 ENCOUNTER — Other Ambulatory Visit (HOSPITAL_COMMUNITY): Payer: 59

## 2020-02-20 VITALS — BP 115/66 | HR 63 | Temp 98.4°F | Resp 18

## 2020-02-20 DIAGNOSIS — D500 Iron deficiency anemia secondary to blood loss (chronic): Secondary | ICD-10-CM | POA: Diagnosis not present

## 2020-02-20 MED ORDER — SODIUM CHLORIDE 0.9 % IV SOLN
200.0000 mg | Freq: Once | INTRAVENOUS | Status: AC
  Administered 2020-02-20: 16:00:00 200 mg via INTRAVENOUS
  Filled 2020-02-20: qty 200

## 2020-02-20 MED ORDER — SODIUM CHLORIDE 0.9 % IV SOLN
Freq: Once | INTRAVENOUS | Status: AC
  Filled 2020-02-20: qty 250

## 2020-02-20 NOTE — Patient Instructions (Signed)

## 2020-02-23 ENCOUNTER — Telehealth: Payer: Self-pay | Admitting: *Deleted

## 2020-02-23 ENCOUNTER — Other Ambulatory Visit: Payer: Self-pay

## 2020-02-23 ENCOUNTER — Inpatient Hospital Stay: Payer: 59

## 2020-02-23 ENCOUNTER — Encounter: Payer: Self-pay | Admitting: Cardiology

## 2020-02-23 ENCOUNTER — Ambulatory Visit (INDEPENDENT_AMBULATORY_CARE_PROVIDER_SITE_OTHER): Payer: 59 | Admitting: Cardiology

## 2020-02-23 VITALS — BP 124/80 | HR 69 | Ht 65.75 in | Wt 171.0 lb

## 2020-02-23 DIAGNOSIS — Z86711 Personal history of pulmonary embolism: Secondary | ICD-10-CM

## 2020-02-23 DIAGNOSIS — R002 Palpitations: Secondary | ICD-10-CM

## 2020-02-23 DIAGNOSIS — D509 Iron deficiency anemia, unspecified: Secondary | ICD-10-CM | POA: Diagnosis not present

## 2020-02-23 DIAGNOSIS — R0600 Dyspnea, unspecified: Secondary | ICD-10-CM

## 2020-02-23 DIAGNOSIS — R000 Tachycardia, unspecified: Secondary | ICD-10-CM | POA: Diagnosis not present

## 2020-02-23 DIAGNOSIS — D500 Iron deficiency anemia secondary to blood loss (chronic): Secondary | ICD-10-CM | POA: Diagnosis not present

## 2020-02-23 DIAGNOSIS — Z01818 Encounter for other preprocedural examination: Secondary | ICD-10-CM | POA: Diagnosis not present

## 2020-02-23 DIAGNOSIS — R0609 Other forms of dyspnea: Secondary | ICD-10-CM | POA: Insufficient documentation

## 2020-02-23 DIAGNOSIS — A048 Other specified bacterial intestinal infections: Secondary | ICD-10-CM

## 2020-02-23 LAB — CBC WITH DIFFERENTIAL (CANCER CENTER ONLY)
Abs Immature Granulocytes: 0.01 10*3/uL (ref 0.00–0.07)
Basophils Absolute: 0.1 10*3/uL (ref 0.0–0.1)
Basophils Relative: 2 %
Eosinophils Absolute: 0.1 10*3/uL (ref 0.0–0.5)
Eosinophils Relative: 3 %
HCT: 35.9 % — ABNORMAL LOW (ref 36.0–46.0)
Hemoglobin: 10.5 g/dL — ABNORMAL LOW (ref 12.0–15.0)
Immature Granulocytes: 0 %
Lymphocytes Relative: 29 %
Lymphs Abs: 1.2 10*3/uL (ref 0.7–4.0)
MCH: 21.6 pg — ABNORMAL LOW (ref 26.0–34.0)
MCHC: 29.2 g/dL — ABNORMAL LOW (ref 30.0–36.0)
MCV: 73.7 fL — ABNORMAL LOW (ref 80.0–100.0)
Monocytes Absolute: 0.3 10*3/uL (ref 0.1–1.0)
Monocytes Relative: 6 %
Neutro Abs: 2.5 10*3/uL (ref 1.7–7.7)
Neutrophils Relative %: 60 %
Platelet Count: 397 10*3/uL (ref 150–400)
RBC: 4.87 MIL/uL (ref 3.87–5.11)
RDW: 26.7 % — ABNORMAL HIGH (ref 11.5–15.5)
WBC Count: 4.2 10*3/uL (ref 4.0–10.5)
nRBC: 0 % (ref 0.0–0.2)

## 2020-02-23 LAB — SAMPLE TO BLOOD BANK

## 2020-02-23 MED ORDER — METRONIDAZOLE 500 MG PO TABS: 500.0000 mg | ORAL_TABLET | Freq: Two times a day (BID) | ORAL | 0 refills | Status: AC

## 2020-02-23 MED ORDER — OMEPRAZOLE 40 MG PO CPDR: 40.0000 mg | DELAYED_RELEASE_CAPSULE | Freq: Two times a day (BID) | ORAL | 0 refills | Status: DC

## 2020-02-23 MED ORDER — CLARITHROMYCIN 500 MG PO TABS: 500.0000 mg | ORAL_TABLET | Freq: Two times a day (BID) | ORAL | 0 refills | Status: AC

## 2020-02-23 MED ORDER — AMOXICILLIN 500 MG PO TABS: 1000.0000 mg | ORAL_TABLET | Freq: Two times a day (BID) | ORAL | 0 refills | Status: AC

## 2020-02-23 NOTE — Assessment & Plan Note (Signed)
I suspect this is from anemia- check echo to r/o cardiomyopathy

## 2020-02-23 NOTE — Assessment & Plan Note (Signed)
H/O PE after breast reduction surgery in 8485 complicated by sepsis and PE. CTA 2000, 2008, and Nov 2021 were negative for PE.

## 2020-02-23 NOTE — Patient Instructions (Signed)
Medication Instructions:  Continue current medications  *If you need a refill on your cardiac medications before your next appointment, please call your pharmacy*   Lab Work: None Ordered   Testing/Procedures: Your physician has requested that you have an echocardiogram. Echocardiography is a painless test that uses sound waves to create images of your heart. It provides your doctor with information about the size and shape of your heart and how well your hearts chambers and valves are working. This procedure takes approximately one hour. There are no restrictions for this procedure.   Follow-Up: At [facility], you and your health needs are our priority.  As part of our continuing mission to provide you with exceptional heart care, we have created designated Provider Care Teams.  These Care Teams include your primary Cardiologist (physician) and Advanced Practice Providers (APPs -  Physician Assistants and Nurse Practitioners) who all work together to provide you with the care you need, when you need it.  We recommend signing up for the patient portal called "MyChart".  Sign up information is provided on this After Visit Summary.  MyChart is used to connect with patients for Virtual Visits (Telemedicine).  Patients are able to view lab/test results, encounter notes, upcoming appointments, etc.  Non-urgent messages can be sent to your provider as well.   To learn more about what you can do with MyChart, go to [email or web address].    Your next appointment:   2 month(s)  The format for your next appointment:   In Person  Provider:   You may see Dr Oval Linsey or one of the following Advanced Practice Providers on your designated Care Team:    [name], PA-C  [place], [place]  Coletta Memos, [place]

## 2020-02-23 NOTE — Progress Notes (Addendum)
Addendum: the patient's echo showed normal LVF. She is an acceptable risk for the proposed procedure without further cardiac work up.  [name] PA-C 02/26/2020 8:40 AM    Cardiology Office Note:    Date:  02/23/2020   ID:  [name], DOB 10-Apr-1970, MRN [id number]  PCP:  Laurey Morale, MD  Cardiologist:  Dr Oval Linsey Electrophysiologist:  None   Referring MD: Laurey Morale, MD   CC: Pre op clearance  History of Present Illness:    [name] is a pleasant 49 y.o. female seen today for pre op clearance.  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 She has a hx of palpitations, Fe deficiency anemia, and a history of a pulmonary embolism in 2000.  At that time she was admitted to [facility] then for breast reduction surgery which was complicated by sepsis.  She ended up being transferred to [facility].  She did have a what sounds like a follow-up CT scan at [facility] in May 2000 that did not show a PE.  She was kept on anticoagulation for a couple years.  In 2003 when she was pregnant she used Lovenox.  After this her anticoagulation was stopped. She was seen by Dr Oval Linsey in 2019 for palpitations,  Monitor showed ectopy, no arrhyhtmia.   She is seen now for pre op clearance prior to hysterectomy.  The patient has been troubled by significant, symptomatic anemia.  She has had an unremarkable GI work.  She has had some irregular menses.  She has required transfusions and she is being follow by hematology.  She is scheduled for hysterectomy next week. She has multiple complaints.  She complains of tachycardia with exertion and fatigue with exertional that she feels is abnormal.  She has had some chest discomfort as well and was actually seen in the ED in Nov 2021.  Chest CTA was negative for PE. Her Hgb was 7.5.   Past Medical History:  Diagnosis Date  . Anemia    iron deficiency   . Blood transfusion without reported diagnosis   . Cardiac arrhythmia   .  Cellulitis   . Chronic headaches   . Chronic lumbar pain since 2013   sees Dr. Arvilla Market   . Clotting disorder ([place])    PE prior to pregnancy.  . Interstitial cystitis   . Palpitations 02/19/2018  . PE (pulmonary embolism)    due to surgery  . Sepsis ([place])    due to surgery  . Staph infection     Past Surgical History:  Procedure Laterality Date  . BREAST REDUCTION SURGERY    . COLONOSCOPY  06/25/2019   per Dr. Havery Moros, adeomatous polyps, repeat in 3 yrs   . EXPLORATORY LAPAROTOMY     with left ovary and mass removed  . RADIOFREQUENCY ABLATION NERVES     lumbar spine, per Dr. Ace Gins   . TUBAL LIGATION    . UPPER GASTROINTESTINAL ENDOSCOPY      Current Medications: Current Meds  Medication Sig  . acetaminophen (TYLENOL) 500 MG tablet Take 1,000 mg by mouth every 8 (eight) hours as needed for moderate pain.  . cholecalciferol (VITAMIN D3) 25 MCG (1000 UNIT) tablet Take 1,000 Units by mouth daily.  . cyclobenzaprine (FLEXERIL) 10 MG tablet Take 1 tablet (10 mg total) by mouth 3 (three) times daily as needed for muscle spasms.  Marland Kitchen gabapentin (NEURONTIN) 300 MG capsule Take 300 mg by mouth 3 (three) times daily as needed (pain).   Marland Kitchen  ibuprofen (ADVIL,MOTRIN) 200 MG tablet Take 600 mg by mouth every 8 (eight) hours as needed for moderate pain.   . traZODone (DESYREL) 50 MG tablet Take 1-2 tablets (50-100 mg total) by mouth at bedtime. (Patient taking differently: Take 50-100 mg by mouth at bedtime as needed for sleep.)  . vitamin B-12 (CYANOCOBALAMIN) 1000 MCG tablet Take 1,000 mcg by mouth daily.  . [DISCONTINUED] norethindrone (MICRONOR) 0.35 MG tablet TAKE 1 TABLET BY MOUTH EVERY DAY     Allergies:   Patient has no known allergies.   Social History   Socioeconomic History  . Marital status: Married    Spouse name: Not on file  . Number of children: 3  . Years of education: Not on file  . Highest education level: Not on file  Occupational History  . Occupation:  Glass blower/designer  Tobacco Use  . Smoking status: Light Tobacco Smoker    Years: 1.00    Types: Cigarettes  . Smokeless tobacco: Never Used  . Tobacco comment: age 39  Vaping Use  . Vaping Use: Never used  Substance and Sexual Activity  . Alcohol use: Yes    Alcohol/week: 0.0 standard drinks    Comment: glass of wine occasionally  . Drug use: No  . Sexual activity: Yes    Partners: Male    Birth control/protection: None    Comment: Tubal ligation  Other Topics Concern  . Not on file  Social History Narrative   Lives at home with husband and 2 children   Right handed   Caffeine: 1-2 cups of coffee per day   Social Determinants of Health   Financial Resource Strain: Not on file  Food Insecurity: Not on file  Transportation Needs: Not on file  Physical Activity: Not on file  Stress: Not on file  Social Connections: Not on file     Family History: The patient's family history includes COPD in her father; Cancer in her paternal aunt; Colon polyps in her maternal grandmother, mother, and paternal grandmother; Diabetes in her maternal grandfather, paternal grandfather, and sister; Heart Problems in her sister; Heart attack in her paternal grandfather and paternal grandmother; Heart defect in her mother and paternal grandmother; Heart disease in her maternal grandfather and paternal grandfather; Hypertension in her father, maternal grandfather, maternal grandmother, paternal grandfather, paternal grandmother, and sister; Irritable bowel syndrome in her mother and paternal aunt; Liver disease in her father. There is no history of Colon cancer, Esophageal cancer, Rectal cancer, or Stomach cancer.  ROS:   Please see the history of present illness.  LE edema- she wears compression stockings    All other systems reviewed and are negative.  EKGs/Labs/Other Studies Reviewed:    The following studies were reviewed today: Chest CTA 01/08/2020  EKG:  EKG is ordered today.  The ekg ordered  today demonstrates NSR, 69  Recent Labs: 09/17/2019: TSH 1.12 01/08/2020: B Natriuretic Peptide 30.3 02/06/2020: ALT 8; BUN 11; Creatinine, Ser 0.72; Potassium 3.7; Sodium 139 02/16/2020: Hemoglobin 8.8; Platelet Count 447  Recent Lipid Panel    Component Value Date/Time   CHOL 191 09/17/2019 1554   TRIG 49 09/17/2019 1554   HDL 88 09/17/2019 1554   CHOLHDL 2.2 09/17/2019 1554   VLDL 12.0 02/15/2017 1036   LDLCALC 89 09/17/2019 1554    Physical Exam:    VS:  BP 124/80 (BP Location: Left Arm, Patient Position: Sitting, Cuff Size: Normal)   Pulse 69   Ht 5' 5.75" (1.67 m)   Wt 171  lb (77.6 kg)   BMI 27.81 kg/m     Wt Readings from Last 3 Encounters:  02/23/20 171 lb (77.6 kg)  02/17/20 173 lb (78.5 kg)  02/13/20 172 lb 8 oz (78.2 kg)     GEN: Well nourished, well developed in no acute distress HEENT: Normal NECK: No JVD; No carotid bruits CARDIAC: RRR, no murmurs, rubs, gallops RESPIRATORY:  Clear to auscultation without rales, wheezing or rhonchi  ABDOMEN: Soft, non-tender, non-distended MUSCULOSKELETAL:  No edema; No deformity  SKIN: Warm and dry NEUROLOGIC:  Alert and oriented x 3 PSYCHIATRIC:  Normal affect   ASSESSMENT:    Pre-operative clearance Pt seen today for pre op (hysterectomy) cardiac clearance  Iron deficiency anemia This has been refractory to iron therapy and tranfusions  History of pulmonary embolus (PE) H/O PE after breast reduction surgery in 8003 complicated by sepsis and PE. CTA 2000, 2008, and Nov 2021 were negative for PE.  DOE (dyspnea on exertion) I suspect this is from anemia- check echo to r/o cardiomyopathy  Palpitations Exertional tachycardia can be explained by her anemia, doubt   PLAN:    Check echo- (scheduled for Wed 12/22) pre op clearance to be sent pending this result.  F/U Dr Oval Linsey in 2 months.   Medication Adjustments/Labs and Tests Ordered: Current medicines are reviewed at length with the patient today.   Concerns regarding medicines are outlined above.  Orders Placed This Encounter  Procedures  . EKG 12-Lead  . ECHOCARDIOGRAM COMPLETE   No orders of the defined types were placed in this encounter.   Patient Instructions  Medication Instructions:  Continue current medications  *If you need a refill on your cardiac medications before your next appointment, please call your pharmacy*   Lab Work: None Ordered   Testing/Procedures: Your physician has requested that you have an echocardiogram. Echocardiography is a painless test that uses sound waves to create images of your heart. It provides your doctor with information about the size and shape of your heart and how well your heart's chambers and valves are working. This procedure takes approximately one hour. There are no restrictions for this procedure.   Follow-Up: At [facility], you and your health needs are our priority.  As part of our continuing mission to provide you with exceptional heart care, we have created designated Provider Care Teams.  These Care Teams include your primary Cardiologist (physician) and Advanced Practice Providers (APPs -  Physician Assistants and Nurse Practitioners) who all work together to provide you with the care you need, when you need it.  We recommend signing up for the patient portal called "MyChart".  Sign up information is provided on this After Visit Summary.  MyChart is used to connect with patients for Virtual Visits (Telemedicine).  Patients are able to view lab/test results, encounter notes, upcoming appointments, etc.  Non-urgent messages can be sent to your provider as well.   To learn more about what you can do with MyChart, go to [email or web address].    Your next appointment:   2 month(s)  The format for your next appointment:   In Person  Provider:   You may see Dr Oval Linsey or one of the following Advanced Practice Providers on your designated Care Team:    [name],  PA-C  [place], [place]  Coletta Memos, FNP       Signed, [name], [place]  02/23/2020 9:40 AM    [place]

## 2020-02-23 NOTE — Assessment & Plan Note (Signed)
Pt seen today for pre op (hysterectomy) cardiac clearance

## 2020-02-23 NOTE — Assessment & Plan Note (Signed)
Exertional tachycardia can be explained by her anemia, doubt

## 2020-02-23 NOTE — Telephone Encounter (Signed)
Patient saw that her Hgb was 10.5.  She is still scheduled for surgery 12/29. She is concerned if we should recheck lab closer to surgery date just in case if we need to transfuse.  She states she is currently on her menstruation.   Concerned about dropping below 10 which could postpone her surgery date again.   Routed to Dr Chryl Heck to review.

## 2020-02-23 NOTE — Assessment & Plan Note (Signed)
This has been refractory to iron therapy and tranfusions

## 2020-02-24 ENCOUNTER — Other Ambulatory Visit (HOSPITAL_COMMUNITY): Payer: Self-pay | Admitting: Hematology and Oncology

## 2020-02-24 DIAGNOSIS — D500 Iron deficiency anemia secondary to blood loss (chronic): Secondary | ICD-10-CM

## 2020-02-24 NOTE — Progress Notes (Signed)
Anesthesia Chart Review:  Case: [id number] Date/Time: 03/03/20 0815   Procedures:      XI ROBOTIC ASSISTED LAPAROSCOPIC TOTAL HYSTERECTOMY WITH BILATERAL SALPINGECTOMY, LYSIS OF ADHESIONS (Bilateral ) - requests 8:30am OR start in [place] [facility] block requests  2 1/2 hours     LYSIS OF ADHESION (N/A )     LAPAROTOMY (N/A )   Anesthesia type: General   Pre-op diagnosis: menorrhagia, fibroids   Location: [facility] ROOM 10 / [facility] OR   Surgeons: Princess Bruins, MD      DISCUSSION: Patient is a 49 year old female scheduled for the above procedure.  History includes former smoker, post-operative N/V, anemia, breast reduction complicated by sepsis and PE (2000), interstitial cystitis, palpitations, chronic back pain (s/p radiofrequency ablation to nerves), tubal sterilization (08/06/02), chronic headaches, uterine fibroid/menorrhagia. She reported history of hypotension and shaking after anesthesia. (As of 01/2019, she was also being considered for right shoulder arthroscopy, LOA, subacromial decompression by Victorino December, MD, but it seems this was postponed while her anemia was being further addressed.)  She had preoperative cardiology evaluation by [name], PA-C on 02/24/2020.  Echo was ordered for dyspnea on exertion, although anemia felt to be likely contributing factor. Had negative CTA chest for PE on 01/08/20. Following 02/25/20 echo results, she was felt "OK for surgery."  She had recent hematology and GI evaluations for severe anemia felt disproportionate to her menstrual blood loss. Colonoscopy "without any high risk pathology to cause this level of anemia". EGD done on 02/17/20 and showed H. Pylori gastritis, although not likely the primary cause of her anemia. Dr. Havery Moros is treating H. pylori ( 14 days amoxicillin, clarithromycin, Flagyl, omeprazole) and if aneima persists after hysterectomy, then would consider capsule study. Hematologist Dr. Chryl Heck has been treating with Venofer (iron  sucrose) IV and PRBC as needed to keep HGB close to 10 g/L for surgery. Currently last Venofer 02/25/20.    02/25/20 H/H 10.9/36.2, up from 10.5/35.9 on 02/23/20. Last PRBC transfusion 02/17/20, so she will need T&S on the day of surgery. She also needs a urine pregnancy test on arrival.   Preoperative COVID-19 testing scheduled for 03/01/2020.   VS: BP 137/66   Temp 36.7 C (Oral)   Resp 17   Ht 5' 5.75" (1.67 m)   Wt 77.2 kg   LMP 02/21/2020 (Approximate)   SpO2 100%   BMI 27.70 kg/m     PROVIDERS: Laurey Morale, MD is PCP  - Sarina Ill, MD is neurologist - Skeet Latch, MD is cardiologist - Wilmer Cellar, MD is GI. Last visit 02/12/20 with Ellouise Newer, PA-C - Marily Memos, MD is HEM. Last visit 02/06/20.    LABS: 02/25/20 CBC results acceptable for OR. Latest lab results include: Lab Results  Component Value Date   WBC 4.8 02/25/2020   HGB 10.9 (L) 02/25/2020   HCT 36.2 02/25/2020   PLT 421 (H) 02/25/2020   GLUCOSE 90 02/06/2020   CHOL 191 09/17/2019   TRIG 49 09/17/2019   HDL 88 09/17/2019   LDLCALC 89 09/17/2019   ALT 8 02/06/2020   AST 11 (L) 02/06/2020   NA 139 02/06/2020   K 3.7 02/06/2020   CL 107 02/06/2020   CREATININE 0.72 02/06/2020   BUN 11 02/06/2020   CO2 26 02/06/2020   TSH 1.12 09/17/2019    OTHER: EGD 02/17/20: Impression: - Esophagogastric landmarks identified. - Normal esophagus - 2 diminutive erosions in the pre pyloric area, no stigmata of bleeding or ulcers noted - Normal  stomach otherwise - biopsies taken to rule out H pylori - Normal duodenal bulb and second portion of the duodenum. Biopsied. No overt cause of severe anemia on this exam, will await biopsy results. - Pathology showed H. pylori associated gastritis  Colonoscopy 06/25/19: Impression: - A single colonic angiodysplastic lesion. - One 3 mm polyp at the hepatic flexure, removed with a cold snare. Resected and retrieved. - Two 3 to 4 mm polyps in the  sigmoid colon, removed with a cold snare. Resected and retrieved. - Diverticulosis in the sigmoid colon. - Tortuous colon. - The examination was otherwise normal.   IMAGES: Chest CTA 01/08/20: IMPRESSION: No pulmonary emboli or acute abnormality.  CXR 01/08/20: FINDINGS: Normal heart size, mediastinal contours, and pulmonary vascularity. Lungs clear. No pleural effusion or pneumothorax. Bones unremarkable. IMPRESSION: Normal exam.   EKG: 02/23/20: Normal sinus rhythm Possible left atrial enlargement Borderline ECG   CV: Echo 02/25/20:  IMPRESSIONS  1. Left ventricular ejection fraction, by estimation, is 70 to 75%. The  left ventricle has hyperdynamic function. The left ventricle has no  regional wall motion abnormalities. Left ventricular diastolic parameters  are indeterminate.  2. Right ventricular systolic function is normal. The right ventricular  size is normal. There is normal pulmonary artery systolic pressure. The  estimated right ventricular systolic pressure is 79.0 mmHg.  3. The mitral valve is grossly normal. Mild mitral valve regurgitation.  4. The aortic valve is tricuspid. Aortic valve regurgitation is not  visualized.  5. The inferior vena cava is dilated in size with >50% respiratory  variability, suggesting right atrial pressure of 8 mmHg.    7 day Event monitor 02/21/18: Quality: Fair. Baseline artifact. Predominant rhythm: sinus  Average heart rate: 85 bpm Max heart rate: 152 bpm Min heart rate: 50 bpm <1% PACs and PVCs noted   Past Medical History:  Diagnosis Date  . Anemia    iron deficiency   . Blood transfusion without reported diagnosis   . Cardiac arrhythmia   . Cellulitis   . Chronic headaches   . Chronic lumbar pain since 2013   sees Dr. Arvilla Market   . Clotting disorder ([place])    PE prior to pregnancy.  . Interstitial cystitis   . Palpitations 02/19/2018  . PE (pulmonary embolism)    due to surgery  . Sepsis ([place])     due to surgery  . Staph infection     Past Surgical History:  Procedure Laterality Date  . BREAST REDUCTION SURGERY    . COLONOSCOPY  06/25/2019   per Dr. Havery Moros, adeomatous polyps, repeat in 3 yrs   . EXPLORATORY LAPAROTOMY     with left ovary and mass removed  . RADIOFREQUENCY ABLATION NERVES     lumbar spine, per Dr. Ace Gins   . TUBAL LIGATION    . UPPER GASTROINTESTINAL ENDOSCOPY      MEDICATIONS: . acetaminophen (TYLENOL) 500 MG tablet  . amoxicillin (AMOXIL) 500 MG tablet  . cholecalciferol (VITAMIN D3) 25 MCG (1000 UNIT) tablet  . clarithromycin (BIAXIN) 500 MG tablet  . cyclobenzaprine (FLEXERIL) 10 MG tablet  . gabapentin (NEURONTIN) 300 MG capsule  . ibuprofen (ADVIL,MOTRIN) 200 MG tablet  . metroNIDAZOLE (FLAGYL) 500 MG tablet  . omeprazole (PRILOSEC) 40 MG capsule  . traZODone (DESYREL) 50 MG tablet  . vitamin B-12 (CYANOCOBALAMIN) 1000 MCG tablet   No current facility-administered medications for this encounter.    Myra Gianotti, PA-C Surgical Short Stay/Anesthesiology [facility] Phone [phone] [facility] Phone [phone] 02/26/2020  9:15 AM

## 2020-02-24 NOTE — Telephone Encounter (Signed)
Kim  When you send me a note, is there a way you can allow me to reply to that note please. Thanks  Then, lets do CBC on 27 th and determine if she needs anything more. I can order CBC.  Thanks,

## 2020-02-25 ENCOUNTER — Inpatient Hospital Stay: Payer: 59

## 2020-02-25 ENCOUNTER — Other Ambulatory Visit: Payer: Self-pay

## 2020-02-25 ENCOUNTER — Ambulatory Visit (HOSPITAL_COMMUNITY)
Admission: RE | Admit: 2020-02-25 | Discharge: 2020-02-25 | Disposition: A | Discharge status: Home / Self Care | Payer: 59 | Source: Ambulatory Visit | Attending: Cardiology | Admitting: Cardiology

## 2020-02-25 ENCOUNTER — Encounter (HOSPITAL_COMMUNITY): Payer: Self-pay | Admitting: Obstetrics & Gynecology

## 2020-02-25 ENCOUNTER — Encounter (HOSPITAL_COMMUNITY)
Admission: RE | Admit: 2020-02-25 | Discharge: 2020-02-25 | Disposition: A | Discharge status: Home / Self Care | Payer: 59 | Source: Ambulatory Visit | Attending: Obstetrics & Gynecology | Admitting: Obstetrics & Gynecology

## 2020-02-25 VITALS — BP 124/62 | HR 64 | Temp 97.6°F | Resp 18

## 2020-02-25 DIAGNOSIS — R0600 Dyspnea, unspecified: Secondary | ICD-10-CM | POA: Diagnosis present

## 2020-02-25 DIAGNOSIS — Z87891 Personal history of nicotine dependence: Secondary | ICD-10-CM | POA: Insufficient documentation

## 2020-02-25 DIAGNOSIS — Z01818 Encounter for other preprocedural examination: Secondary | ICD-10-CM

## 2020-02-25 DIAGNOSIS — D259 Leiomyoma of uterus, unspecified: Secondary | ICD-10-CM | POA: Insufficient documentation

## 2020-02-25 DIAGNOSIS — N920 Excessive and frequent menstruation with regular cycle: Secondary | ICD-10-CM | POA: Insufficient documentation

## 2020-02-25 DIAGNOSIS — Z01812 Encounter for preprocedural laboratory examination: Secondary | ICD-10-CM | POA: Insufficient documentation

## 2020-02-25 DIAGNOSIS — R0609 Other forms of dyspnea: Secondary | ICD-10-CM

## 2020-02-25 DIAGNOSIS — R000 Tachycardia, unspecified: Secondary | ICD-10-CM

## 2020-02-25 DIAGNOSIS — D500 Iron deficiency anemia secondary to blood loss (chronic): Secondary | ICD-10-CM | POA: Diagnosis not present

## 2020-02-25 LAB — CBC
HCT: 36.2 % (ref 36.0–46.0)
Hemoglobin: 10.9 g/dL — ABNORMAL LOW (ref 12.0–15.0)
MCH: 22.6 pg — ABNORMAL LOW (ref 26.0–34.0)
MCHC: 30.1 g/dL (ref 30.0–36.0)
MCV: 74.9 fL — ABNORMAL LOW (ref 80.0–100.0)
Platelets: 421 10*3/uL — ABNORMAL HIGH (ref 150–400)
RBC: 4.83 MIL/uL (ref 3.87–5.11)
RDW: 27.6 % — ABNORMAL HIGH (ref 11.5–15.5)
WBC: 4.8 10*3/uL (ref 4.0–10.5)
nRBC: 0 % (ref 0.0–0.2)

## 2020-02-25 LAB — SARS CORONAVIRUS 2 (TAT 6-24 HRS): SARS Coronavirus 2: NEGATIVE

## 2020-02-25 LAB — ECHOCARDIOGRAM COMPLETE
Area-P 1/2: 2.94 cm2
S' Lateral: 2 cm

## 2020-02-25 MED ORDER — SODIUM CHLORIDE 0.9 % IV SOLN
200.0000 mg | Freq: Once | INTRAVENOUS | Status: AC
  Administered 2020-02-25: 16:00:00 200 mg via INTRAVENOUS
  Filled 2020-02-25: qty 200

## 2020-02-25 MED ORDER — SODIUM CHLORIDE 0.9 % IV SOLN
Freq: Once | INTRAVENOUS | Status: AC
  Filled 2020-02-25: qty 250

## 2020-02-25 NOTE — Pre-Procedure Instructions (Signed)
Your procedure is scheduled on Wed., Dec. 29, 2021 from 8:30AM-1:07PM.  Report to [facility] Entrance "A" at 6:30AM  Call this number if you have problems the morning of surgery:  [phone]   Remember:  Do not eat after midnight on Dec. 28th  You may drink clear liquids until 3 hours 5:30AM prior to surgery time.  Clear liquids allowed are: Water, Non-Citric Juice (no pulp), Black Coffee Only (no dairy or creamer), Clear Tea (no dairy or creamer), Carbonated Beverages, Gatorade/Powrade, Plain Popsicles, Plain Jell-O.    Take these medicines the morning of surgery with A SIP OF WATER: Omeprazole (PRILOSEC) Acetaminophen (TYLENOL)  Gabapentin (NEURONTIN)  As of today, STOP taking all Aspirin (unless instructed by your doctor) and Other Aspirin containing products, Vitamins, Fish oils, and Herbal medications. Also stop all NSAIDS i.e. Advil, Ibuprofen, Motrin, Aleve, Anaprox, Naproxen, BC, Goody Powders, and all Supplements.   No Smoking of any kind, Tobacco/Vaping, or Alcohol products 24 hours prior to your procedure. If you use a Cpap at night, you may bring all equipment for your overnight stay.   Special instructions:   [facility]- Preparing For Surgery  Before surgery, you can play an important role. Because skin is not sterile, your skin needs to be as free of germs as possible. You can reduce the number of germs on your skin by washing with CHG (chlorahexidine gluconate) Soap before surgery.  CHG is an antiseptic cleaner which kills germs and bonds with the skin to continue killing germs even after washing.    Please do not use if you have an allergy to CHG or antibacterial soaps. If your skin becomes reddened/irritated stop using the CHG.  Do not shave (including legs and underarms) for at least 48 hours prior to first CHG shower. It is OK to shave your face.  Please follow these instructions carefully.   1. Shower the NIGHT BEFORE SURGERY and the MORNING OF  SURGERY with CHG.   2. If you chose to wash your hair, wash your hair first as usual with your normal shampoo.  3. After you shampoo, rinse your hair and body thoroughly to remove the shampoo.  4. Use CHG as you would any other liquid soap. You can apply CHG directly to the skin and wash gently with a scrungie or a clean washcloth.   5. Apply the CHG Soap to your body ONLY FROM THE NECK DOWN.  Do not use on open wounds or open sores. Avoid contact with your eyes, ears, mouth and genitals (private parts). Wash Face and genitals (private parts)  with your normal soap.  6. Wash thoroughly, paying special attention to the area where your surgery will be performed.  7. Thoroughly rinse your body with warm water from the neck down.  8. DO NOT shower/wash with your normal soap after using and rinsing off the CHG Soap.  9. Pat yourself dry with a CLEAN TOWEL.  10. Wear CLEAN PAJAMAS to bed the night before surgery, wear comfortable clothes the morning of surgery  11. Place CLEAN SHEETS on your bed the night of your first shower and DO NOT SLEEP WITH PETS.   Day of Surgery:             Remember to brush your teeth WITH YOUR REGULAR TOOTHPASTE.  Do not wear jewelry, make-up or nail polish.  Do not wear lotions, powders, or perfumes, or deodorant.  Do not shave 48 hours prior to surgery.    Do not  bring valuables to the hospital.  [facility] is not responsible for any belongings or valuables.  Contacts, dentures or bridgework may not be worn into surgery.  Leave your suitcase in the car.  After surgery it may be brought to your room.  For patients admitted to the hospital, discharge time will be determined by your treatment team.  Patients discharged the day of surgery will not be allowed to drive home, and someone age 34 and over needs to stay with them for 24 hours.  Please wear clean clothes to the hospital/surgery center.    Please read over the following fact sheets that you were  given.

## 2020-02-25 NOTE — Progress Notes (Signed)
*  PRELIMINARY RESULTS* Echocardiogram 2D Echocardiogram has been performed.  Samuel Germany 02/25/2020, 10:16 AM

## 2020-02-25 NOTE — Patient Instructions (Signed)

## 2020-02-26 ENCOUNTER — Telehealth: Payer: Self-pay | Admitting: Hematology and Oncology

## 2020-02-26 ENCOUNTER — Other Ambulatory Visit: Payer: Self-pay | Admitting: *Deleted

## 2020-02-26 DIAGNOSIS — D500 Iron deficiency anemia secondary to blood loss (chronic): Secondary | ICD-10-CM

## 2020-02-26 NOTE — Telephone Encounter (Signed)
Scheduled lab appointment per 12/23 schedule message. Patient is aware.

## 2020-02-26 NOTE — Anesthesia Preprocedure Evaluation (Deleted)
Anesthesia Evaluation    Airway        Dental   Pulmonary former smoker,           Cardiovascular      Neuro/Psych    GI/Hepatic   Endo/Other    Renal/GU      Musculoskeletal   Abdominal   Peds  Hematology   Anesthesia Other Findings   Reproductive/Obstetrics                             Anesthesia Physical Anesthesia Plan  ASA:   Anesthesia Plan:    Post-op Pain Management:    Induction:   PONV Risk Score and Plan:   Airway Management Planned:   Additional Equipment:   Intra-op Plan:   Post-operative Plan:   Informed Consent:   Plan Discussed with:   Anesthesia Plan Comments: (PAT note written by Myra Gianotti, PA-C. )        Anesthesia Quick Evaluation

## 2020-03-01 ENCOUNTER — Encounter: Payer: Self-pay | Admitting: Obstetrics & Gynecology

## 2020-03-01 ENCOUNTER — Inpatient Hospital Stay: Payer: 59

## 2020-03-01 ENCOUNTER — Other Ambulatory Visit (HOSPITAL_COMMUNITY)
Admission: RE | Admit: 2020-03-01 | Discharge: 2020-03-01 | Disposition: A | Discharge status: Home / Self Care | Payer: 59 | Source: Ambulatory Visit | Attending: Obstetrics & Gynecology | Admitting: Obstetrics & Gynecology

## 2020-03-01 ENCOUNTER — Other Ambulatory Visit: Payer: Self-pay

## 2020-03-01 DIAGNOSIS — Z01812 Encounter for preprocedural laboratory examination: Secondary | ICD-10-CM | POA: Diagnosis not present

## 2020-03-01 DIAGNOSIS — U071 COVID-19: Secondary | ICD-10-CM | POA: Diagnosis not present

## 2020-03-01 DIAGNOSIS — D500 Iron deficiency anemia secondary to blood loss (chronic): Secondary | ICD-10-CM | POA: Diagnosis not present

## 2020-03-01 HISTORY — DX: COVID-19: U07.1

## 2020-03-01 LAB — CBC WITH DIFFERENTIAL (CANCER CENTER ONLY)
Abs Immature Granulocytes: 0.01 10*3/uL (ref 0.00–0.07)
Basophils Absolute: 0.1 10*3/uL (ref 0.0–0.1)
Basophils Relative: 1 %
Eosinophils Absolute: 0 10*3/uL (ref 0.0–0.5)
Eosinophils Relative: 1 %
HCT: 35.9 % — ABNORMAL LOW (ref 36.0–46.0)
Hemoglobin: 10.7 g/dL — ABNORMAL LOW (ref 12.0–15.0)
Immature Granulocytes: 0 %
Lymphocytes Relative: 5 %
Lymphs Abs: 0.3 10*3/uL — ABNORMAL LOW (ref 0.7–4.0)
MCH: 22.5 pg — ABNORMAL LOW (ref 26.0–34.0)
MCHC: 29.8 g/dL — ABNORMAL LOW (ref 30.0–36.0)
MCV: 75.6 fL — ABNORMAL LOW (ref 80.0–100.0)
Monocytes Absolute: 0.6 10*3/uL (ref 0.1–1.0)
Monocytes Relative: 10 %
Neutro Abs: 4.9 10*3/uL (ref 1.7–7.7)
Neutrophils Relative %: 83 %
Platelet Count: 364 10*3/uL (ref 150–400)
RBC: 4.75 MIL/uL (ref 3.87–5.11)
RDW: 29.1 % — ABNORMAL HIGH (ref 11.5–15.5)
WBC Count: 5.9 10*3/uL (ref 4.0–10.5)
nRBC: 0 % (ref 0.0–0.2)

## 2020-03-01 LAB — SAMPLE TO BLOOD BANK

## 2020-03-01 LAB — SARS CORONAVIRUS 2 (TAT 6-24 HRS): SARS Coronavirus 2: POSITIVE — AB

## 2020-03-02 ENCOUNTER — Telehealth: Payer: Self-pay | Admitting: Hematology and Oncology

## 2020-03-02 NOTE — Telephone Encounter (Signed)
Scheduled follow-up appointment per 12/28 schedule message. Patient is aware. 

## 2020-03-02 NOTE — Progress Notes (Signed)
Patient with positive covid result. Contacted MD and informed of result.   

## 2020-03-03 ENCOUNTER — Telehealth (HOSPITAL_COMMUNITY): Payer: Self-pay | Admitting: Family

## 2020-03-03 ENCOUNTER — Ambulatory Visit (HOSPITAL_COMMUNITY): Admission: RE | Admit: 2020-03-03 | Payer: 59 | Source: Home / Self Care | Admitting: Obstetrics & Gynecology

## 2020-03-03 HISTORY — DX: Nausea with vomiting, unspecified: R11.2

## 2020-03-03 HISTORY — DX: Other specified postprocedural states: Z98.890

## 2020-03-03 HISTORY — DX: Other specified postprocedural states: R11.2

## 2020-03-03 HISTORY — DX: Pneumonia, unspecified organism: J18.9

## 2020-03-03 HISTORY — DX: Other complications of anesthesia, initial encounter: T88.59XA

## 2020-03-03 SURGERY — HYSTERECTOMY, TOTAL, ROBOT-ASSISTED, LAPAROSCOPIC, WITH BILATERAL SALPINGO-OOPHORECTOMY
Anesthesia: General

## 2020-03-03 NOTE — Telephone Encounter (Signed)
Called to discuss with Abelino Derrick about Covid symptoms and potential candidacy for the use of sotrovimab, a combination monoclonal antibody infusion for those with mild to moderate Covid symptoms and at a high risk of hospitalization.     Pt is qualified for this infusion at the infusion center due to co-morbid conditions and/or a member of an at-risk group, however unable to reach patient.   Laresha Bacorn,NP

## 2020-03-04 ENCOUNTER — Other Ambulatory Visit: Payer: Self-pay | Admitting: Obstetrics & Gynecology

## 2020-03-04 MED ORDER — NORETHINDRONE 0.35 MG PO TABS: 1.0000 | ORAL_TABLET | Freq: Every day | ORAL | 0 refills | Status: DC

## 2020-03-08 ENCOUNTER — Telehealth: Payer: Self-pay

## 2020-03-08 NOTE — Telephone Encounter (Signed)
I called patient to let her know that I am aware I need to reschedule her surgery for Feb but I do not have the Feb schedule yet.  She informed me her insurance has changed so I will recheck her benefits and advise regarding surgery prepayment due.  I did tell her that I am holding some block robot time in Feb but it is very tentative until I receive the Feb schedule and see if I can secure an assistant for that day.  I will call her with benefits and when I have confirmed date in Feb.

## 2020-03-14 ENCOUNTER — Other Ambulatory Visit: Payer: Self-pay | Admitting: Obstetrics & Gynecology

## 2020-03-15 NOTE — Telephone Encounter (Signed)
Did you want any refills on Micronor 0.35 mg pills?

## 2020-03-17 ENCOUNTER — Other Ambulatory Visit: Payer: Self-pay

## 2020-03-18 ENCOUNTER — Encounter: Payer: Self-pay | Admitting: Hematology and Oncology

## 2020-03-18 NOTE — Progress Notes (Signed)
Received referral message from scheduling regarding patient with insurance network concerns.  Called patient to introduce myself as Arboriculturist and to listen to concerns and offer support.   Patient has a new Buyer, retail and was not on file and had spoken with company whom advised her provider was not in network which raised concern for her and after speaking with scheduling, she canceled her appointment until it was able to be resolved.   Had patient to email copy of card and shared with authorization team as well as sent chat to them regarding concerns. Confirmed with patient received and advised the auth team would reach out to her insurance company to determine authorization for provider/treatment and then scheduling would be notified on how to proceed. Gave her my direct contact name and number so that she will know whom she spoke with. Apologized for the inconvenience of being shifted around to many different people without a resolution. She was very Patent attorney.   Information sent to [place] and Hillard Danker w/ authorizations and Juliann Pulse for [employer].

## 2020-03-19 ENCOUNTER — Inpatient Hospital Stay: Payer: 59

## 2020-03-25 ENCOUNTER — Ambulatory Visit: Payer: 59 | Admitting: Obstetrics & Gynecology

## 2020-03-26 ENCOUNTER — Inpatient Hospital Stay: Payer: 59

## 2020-03-26 ENCOUNTER — Telehealth: Payer: Self-pay

## 2020-03-26 NOTE — Telephone Encounter (Signed)
Patient called office stating she wants to know if her iron infusion that was scheduled today would be covered by her insurance.  Patient states she has been in contact with Stefanie Libel but was waiting to hear back from her for clarification.  Informed patient that I could see on my end that her infusions had been authorized. Patient stated she was still hesitant to keep her infusion appointment today as she thought she needed to wait until after a specific date. This RN was not able to view this information.  Patient asked to have appointment rescheduled. Scheduling message sent for appointment to be rescheduled.

## 2020-03-30 ENCOUNTER — Telehealth: Payer: Self-pay

## 2020-03-30 NOTE — Telephone Encounter (Signed)
Patient has been notified via My Chart.

## 2020-03-30 NOTE — Telephone Encounter (Signed)
-----   Message from Yevette Edwards, RN sent at 02/23/2020 12:59 PM EST ----- Regarding: Labs H. Pylori stool antigen, order in epic

## 2020-03-31 ENCOUNTER — Other Ambulatory Visit: Payer: Self-pay

## 2020-03-31 ENCOUNTER — Ambulatory Visit (INDEPENDENT_AMBULATORY_CARE_PROVIDER_SITE_OTHER): Payer: No Typology Code available for payment source | Admitting: Obstetrics & Gynecology

## 2020-03-31 ENCOUNTER — Encounter: Payer: Self-pay | Admitting: Obstetrics & Gynecology

## 2020-03-31 VITALS — BP 144/80 | HR 80 | Resp 14 | Ht 65.75 in | Wt 171.0 lb

## 2020-03-31 DIAGNOSIS — D219 Benign neoplasm of connective and other soft tissue, unspecified: Secondary | ICD-10-CM

## 2020-03-31 DIAGNOSIS — D500 Iron deficiency anemia secondary to blood loss (chronic): Secondary | ICD-10-CM

## 2020-03-31 DIAGNOSIS — B3731 Acute candidiasis of vulva and vagina: Secondary | ICD-10-CM

## 2020-03-31 DIAGNOSIS — B373 Candidiasis of vulva and vagina: Secondary | ICD-10-CM

## 2020-03-31 DIAGNOSIS — N920 Excessive and frequent menstruation with regular cycle: Secondary | ICD-10-CM

## 2020-03-31 MED ORDER — FLUCONAZOLE 150 MG PO TABS: 150.0000 mg | ORAL_TABLET | Freq: Every day | ORAL | 1 refills | Status: AC

## 2020-03-31 NOTE — Progress Notes (Signed)
    [name] May 17, 1970 [id number]        50 y.o.  G4P0013   RP: Preop XI Robotic TLH/Bilateral Salpingectomy  HPI: Surgery rescheduled as patient tested positive for Covid-19 at last preop testing.  No current heavy bleeding on the Progestin only BCPs.  Was not able to receive her Iron Infusions while she was Covid positive.  Will recheck a CBC prior to surgery.  Has Blood cross-matched from family members for transfusion as needed.   OB History  Gravida Para Term Preterm AB Living  4 3     1 3   SAB IAB Ectopic Multiple Live Births               # Outcome Date GA Lbr Len/2nd Weight Sex Delivery Anes PTL Lv  4 AB           3 Para           2 Para           1 Para             Past medical history,surgical history, problem list, medications, allergies, family history and social history were all reviewed and documented in the EPIC chart.   Directed ROS with pertinent positives and negatives documented in the history of present illness/assessment and plan.  Exam:  Vitals:   03/31/20 1428  BP: (!) 144/80  Pulse: 80  Resp: 14  Weight: 171 lb (77.6 kg)  Height: 5' 5.75" (1.67 m)   General appearance:  Normal   Gynecologic exam: Deferred   Assessment/Plan:  50 y.o. G4P0013   1. Menorrhagia with regular cycle Will proceed with XI Robotic TLH/Bilateral Salpingectomy on 04/14/2020.  Surgery and risks thoroughly reviewed again.  Patient voiced understanding and agreement with plan.  2. Fibroids As above.  3. Anemia due to chronic blood loss Will repeat a CBC at the Preop visit at [facility] and transfuse cross-matched blood from family members as needed.  4. Yeast vaginitis Will treat with Fluconazole.  Usage reviewed and prescription sent to pharmacy.  Other orders - amoxicillin (AMOXIL) 500 MG capsule; Take 1,000 mg by mouth 2 (two) times daily. - fluconazole (DIFLUCAN) 150 MG tablet; Take 1 tablet (150 mg total) by mouth daily for 3 days.  Princess Bruins MD,  2:41 PM 03/31/2020

## 2020-04-02 ENCOUNTER — Inpatient Hospital Stay: Payer: 59

## 2020-04-05 ENCOUNTER — Telehealth: Payer: Self-pay

## 2020-04-05 NOTE — Telephone Encounter (Signed)
Patient called to inquire about moving her surgery later by a week or two as some things have come up that she says are stressing her.I explained that no time available that soon. I also explained that she had been scheduled at [facility].  I told her that it might be March if she reschedules and that her surgery needs to be her priority over all other things. She agreed and said she just needed to hear me tell her that I could not r/s it soon.

## 2020-04-06 NOTE — Pre-Procedure Instructions (Signed)
[id number] - [place], [place] - [address] [place] [address] Phone: [phone] Fax: [phone]  [facility] (New Address) - [place], [place] AT Previously: [address], [address] [place] [address] Phone: [phone] Fax: [phone]      Your procedure is scheduled on Wednesday, February 9th.  Report to [facility] Main Entrance "A" at 6:30 A.M., and check in at the Admitting office.  Call this number if you have problems the morning of surgery:  [phone]  Call [phone] if you have any questions prior to your surgery date Monday-Friday 8am-4pm    Remember:  Do not eat or drink after midnight the night before your surgery    Take these medicines the morning of surgery with A SIP OF WATER  acetaminophen (TYLENOL)-use as needed.  amoxicillin (AMOXIL) cyclobenzaprine (FLEXERIL)-use as needed. gabapentin (NEURONTIN)-use as needed. omeprazole (PRILOSEC)  norethindrone (MICRONOR)   As of today, STOP taking any Aspirin (unless otherwise instructed by your surgeon) Aleve, Naproxen, Ibuprofen, Motrin, Advil, Goody's, BC's, all herbal medications, fish oil, and all vitamins.              Do not wear jewelry, make up, or nail polish            Do not wear lotions, powders, perfumes, or deodorant.            Do not shave 48 hours prior to surgery.             Do not bring valuables to the hospital.            [facility] is not responsible for any belongings or valuables.  Do NOT Smoke (Tobacco/Vaping) or drink Alcohol 24 hours prior to your procedure If you use a CPAP at night, you may bring all equipment for your overnight stay.   Contacts, glasses, dentures or bridgework may not be worn into surgery.      For patients admitted to the hospital, discharge time will be determined by your  treatment team.   Patients discharged the day of surgery will not be allowed to drive home, and someone needs to stay with them for 24 hours.    Special instructions:   [facility]- Preparing For Surgery  Before surgery, you can play an important role. Because skin is not sterile, your skin needs to be as free of germs as possible. You can reduce the number of germs on your skin by washing with CHG (chlorahexidine gluconate) Soap before surgery.  CHG is an antiseptic cleaner which kills germs and bonds with the skin to continue killing germs even after washing.    Oral Hygiene is also important to reduce your risk of infection.  Remember - BRUSH YOUR TEETH THE MORNING OF SURGERY WITH YOUR REGULAR TOOTHPASTE  Please do not use if you have an allergy to CHG or antibacterial soaps. If your skin becomes reddened/irritated stop using the CHG.  Do not shave (including legs and underarms) for at least 48 hours prior to first CHG shower. It is OK to shave your face.  Please follow these instructions carefully.   1. Shower the NIGHT BEFORE SURGERY and the MORNING OF SURGERY with CHG Soap.   2. If you chose to wash your hair, wash your hair first as usual with your normal shampoo.  3. After you  shampoo, rinse your hair and body thoroughly to remove the shampoo.  4. Use CHG as you would any other liquid soap. You can apply CHG directly to the skin and wash gently with a scrungie or a clean washcloth.   5. Apply the CHG Soap to your body ONLY FROM THE NECK DOWN.  Do not use on open wounds or open sores. Avoid contact with your eyes, ears, mouth and genitals (private parts). Wash Face and genitals (private parts)  with your normal soap.   6. Wash thoroughly, paying special attention to the area where your surgery will be performed.  7. Thoroughly rinse your body with warm water from the neck down.  8. DO NOT shower/wash with your normal soap after using and rinsing off the CHG Soap.  9. Pat  yourself dry with a CLEAN TOWEL.  10. Wear CLEAN PAJAMAS to bed the night before surgery  11. Place CLEAN SHEETS on your bed the night of your first shower and DO NOT SLEEP WITH PETS.   Day of Surgery: Wear Clean/Comfortable clothing the morning of surgery Do not apply any deodorants/lotions.   Remember to brush your teeth WITH YOUR REGULAR TOOTHPASTE.   Please read over the following fact sheets that you were given.

## 2020-04-07 ENCOUNTER — Encounter (HOSPITAL_COMMUNITY): Payer: Self-pay

## 2020-04-07 ENCOUNTER — Other Ambulatory Visit: Payer: Self-pay

## 2020-04-07 ENCOUNTER — Encounter (HOSPITAL_COMMUNITY)
Admission: RE | Admit: 2020-04-07 | Discharge: 2020-04-07 | Disposition: A | Discharge status: Home / Self Care | Payer: PRIVATE HEALTH INSURANCE | Source: Ambulatory Visit | Attending: Obstetrics & Gynecology | Admitting: Obstetrics & Gynecology

## 2020-04-07 DIAGNOSIS — Z01812 Encounter for preprocedural laboratory examination: Secondary | ICD-10-CM | POA: Insufficient documentation

## 2020-04-07 LAB — CBC
HCT: 38.1 % (ref 36.0–46.0)
Hemoglobin: 11.4 g/dL — ABNORMAL LOW (ref 12.0–15.0)
MCH: 24.9 pg — ABNORMAL LOW (ref 26.0–34.0)
MCHC: 29.9 g/dL — ABNORMAL LOW (ref 30.0–36.0)
MCV: 83.2 fL (ref 80.0–100.0)
Platelets: 430 10*3/uL — ABNORMAL HIGH (ref 150–400)
RBC: 4.58 MIL/uL (ref 3.87–5.11)
RDW: 26.2 % — ABNORMAL HIGH (ref 11.5–15.5)
WBC: 4.3 10*3/uL (ref 4.0–10.5)
nRBC: 0 % (ref 0.0–0.2)

## 2020-04-07 LAB — BASIC METABOLIC PANEL
Anion gap: 9 (ref 5–15)
BUN: 11 mg/dL (ref 6–20)
CO2: 22 mmol/L (ref 22–32)
Calcium: 9.1 mg/dL (ref 8.9–10.3)
Chloride: 106 mmol/L (ref 98–111)
Creatinine, Ser: 0.76 mg/dL (ref 0.44–1.00)
GFR, Estimated: >60 mL/min (ref >60)
Glucose, Bld: 92 mg/dL (ref 70–99)
Potassium: 3.8 mmol/L (ref 3.5–5.1)
Sodium: 137 mmol/L (ref 135–145)

## 2020-04-07 NOTE — Progress Notes (Signed)
PCP: Dr. Alysia Penna Cardiologist:  Dr. Jacinto Reap  EKG:  02/23/20 CXR:  01/08/20 ECHO:  02/25/20 Stress Test:  Denies Cardiac Cath:  Denies  Fasting Blood Sugar-  N/A Checks Blood Sugar_N/A__ times a day  OSA/CPAP:  No  ASA/Blood Thinners:  No  Covid positive 12/21.  No need to retest  Anesthesia Review:  Yes, sees cards for tachycardia.  May be related to anemia.  Has had 3 transfusions per patient in the last 60 days.   Patient denies shortness of breath, fever, cough, and chest pain at PAT appointment.  Patient verbalized understanding of instructions provided today at the PAT appointment.  Patient asked to review instructions at home and day of surgery.

## 2020-04-08 ENCOUNTER — Other Ambulatory Visit: Payer: Self-pay

## 2020-04-08 ENCOUNTER — Other Ambulatory Visit: Payer: Self-pay | Admitting: Hematology and Oncology

## 2020-04-08 MED ORDER — ESZOPICLONE 1 MG PO TABS: 1.0000 mg | ORAL_TABLET | Freq: Every evening | ORAL | 0 refills | Status: DC | PRN

## 2020-04-09 ENCOUNTER — Inpatient Hospital Stay: Payer: 59

## 2020-04-09 ENCOUNTER — Telehealth: Payer: Self-pay | Admitting: Hematology and Oncology

## 2020-04-09 NOTE — Telephone Encounter (Signed)
Scheduled appt per 2/3 sch msg- pt is aware of new appt date and time   

## 2020-04-13 ENCOUNTER — Telehealth: Payer: Self-pay

## 2020-04-13 ENCOUNTER — Encounter (HOSPITAL_COMMUNITY): Payer: Self-pay | Admitting: Obstetrics & Gynecology

## 2020-04-13 DIAGNOSIS — Z0289 Encounter for other administrative examinations: Secondary | ICD-10-CM

## 2020-04-13 MED ORDER — MEGESTROL ACETATE 40 MG PO TABS: ORAL_TABLET | ORAL | 0 refills | Status: DC

## 2020-04-13 NOTE — Telephone Encounter (Signed)
I called patient regarding needed a medical records release form for disability form.  She mentioned to me that she has been bleeding very heavy for the last few days. She stated "it is pouring out of me".  I talked with Dr. Dellis Filbert and she said patient could try taking two Megace now and take two in the morning with the tiniest sip of water as soon as she is up. I warned patient it may or may not help. Rx sent.

## 2020-04-14 ENCOUNTER — Ambulatory Visit (HOSPITAL_COMMUNITY)
Admission: RE | Admit: 2020-04-14 | Discharge: 2020-04-15 | Disposition: A | Discharge status: Home / Self Care | Payer: PRIVATE HEALTH INSURANCE | Source: Ambulatory Visit | Attending: Obstetrics & Gynecology | Admitting: Obstetrics & Gynecology

## 2020-04-14 ENCOUNTER — Other Ambulatory Visit: Payer: Self-pay

## 2020-04-14 ENCOUNTER — Ambulatory Visit (HOSPITAL_COMMUNITY): Payer: PRIVATE HEALTH INSURANCE | Admitting: Anesthesiology

## 2020-04-14 ENCOUNTER — Encounter (HOSPITAL_COMMUNITY)
Admission: RE | Disposition: A | Discharge status: Home / Self Care | Payer: Self-pay | Source: Ambulatory Visit | Attending: Obstetrics & Gynecology

## 2020-04-14 ENCOUNTER — Encounter (HOSPITAL_COMMUNITY): Payer: Self-pay | Admitting: Obstetrics & Gynecology

## 2020-04-14 DIAGNOSIS — Z8249 Family history of ischemic heart disease and other diseases of the circulatory system: Secondary | ICD-10-CM | POA: Insufficient documentation

## 2020-04-14 DIAGNOSIS — D689 Coagulation defect, unspecified: Secondary | ICD-10-CM | POA: Insufficient documentation

## 2020-04-14 DIAGNOSIS — Z87891 Personal history of nicotine dependence: Secondary | ICD-10-CM | POA: Insufficient documentation

## 2020-04-14 DIAGNOSIS — D252 Subserosal leiomyoma of uterus: Secondary | ICD-10-CM | POA: Insufficient documentation

## 2020-04-14 DIAGNOSIS — Z79899 Other long term (current) drug therapy: Secondary | ICD-10-CM | POA: Diagnosis not present

## 2020-04-14 DIAGNOSIS — Z825 Family history of asthma and other chronic lower respiratory diseases: Secondary | ICD-10-CM | POA: Insufficient documentation

## 2020-04-14 DIAGNOSIS — D251 Intramural leiomyoma of uterus: Secondary | ICD-10-CM | POA: Insufficient documentation

## 2020-04-14 DIAGNOSIS — Z8379 Family history of other diseases of the digestive system: Secondary | ICD-10-CM | POA: Insufficient documentation

## 2020-04-14 DIAGNOSIS — Z833 Family history of diabetes mellitus: Secondary | ICD-10-CM | POA: Diagnosis not present

## 2020-04-14 DIAGNOSIS — Z9889 Other specified postprocedural states: Secondary | ICD-10-CM

## 2020-04-14 DIAGNOSIS — Z8371 Family history of colonic polyps: Secondary | ICD-10-CM | POA: Diagnosis not present

## 2020-04-14 DIAGNOSIS — N920 Excessive and frequent menstruation with regular cycle: Secondary | ICD-10-CM | POA: Diagnosis not present

## 2020-04-14 DIAGNOSIS — Z808 Family history of malignant neoplasm of other organs or systems: Secondary | ICD-10-CM | POA: Diagnosis not present

## 2020-04-14 DIAGNOSIS — Z8616 Personal history of COVID-19: Secondary | ICD-10-CM | POA: Insufficient documentation

## 2020-04-14 DIAGNOSIS — D259 Leiomyoma of uterus, unspecified: Secondary | ICD-10-CM

## 2020-04-14 DIAGNOSIS — N800 Endometriosis of uterus: Secondary | ICD-10-CM

## 2020-04-14 DIAGNOSIS — Z86711 Personal history of pulmonary embolism: Secondary | ICD-10-CM | POA: Diagnosis not present

## 2020-04-14 DIAGNOSIS — D500 Iron deficiency anemia secondary to blood loss (chronic): Secondary | ICD-10-CM | POA: Insufficient documentation

## 2020-04-14 HISTORY — PX: ROBOTIC ASSISTED LAPAROSCOPIC HYSTERECTOMY AND SALPINGECTOMY: SHX6379

## 2020-04-14 LAB — CBC
HCT: 33.3 % — ABNORMAL LOW (ref 36.0–46.0)
Hemoglobin: 10.1 g/dL — ABNORMAL LOW (ref 12.0–15.0)
MCH: 25 pg — ABNORMAL LOW (ref 26.0–34.0)
MCHC: 30.3 g/dL (ref 30.0–36.0)
MCV: 82.4 fL (ref 80.0–100.0)
Platelets: 372 10*3/uL (ref 150–400)
RBC: 4.04 MIL/uL (ref 3.87–5.11)
RDW: 23.6 % — ABNORMAL HIGH (ref 11.5–15.5)
WBC: 9.7 10*3/uL (ref 4.0–10.5)
nRBC: 0 % (ref 0.0–0.2)

## 2020-04-14 LAB — POCT I-STAT, CHEM 8
BUN: 15 mg/dL (ref 6–20)
Calcium, Ion: 1.25 mmol/L (ref 1.15–1.40)
Chloride: 105 mmol/L (ref 98–111)
Creatinine, Ser: 0.6 mg/dL (ref 0.44–1.00)
Glucose, Bld: 100 mg/dL — ABNORMAL HIGH (ref 70–99)
HCT: 34 % — ABNORMAL LOW (ref 36.0–46.0)
Hemoglobin: 11.6 g/dL — ABNORMAL LOW (ref 12.0–15.0)
Potassium: 3.6 mmol/L (ref 3.5–5.1)
Sodium: 141 mmol/L (ref 135–145)
TCO2: 23 mmol/L (ref 22–32)

## 2020-04-14 LAB — PREPARE RBC (CROSSMATCH)

## 2020-04-14 LAB — CREATININE, SERUM
Creatinine, Ser: 0.6 mg/dL (ref 0.44–1.00)
GFR, Estimated: >60 mL/min (ref >60)

## 2020-04-14 LAB — POCT PREGNANCY, URINE: Preg Test, Ur: NEGATIVE

## 2020-04-14 SURGERY — XI ROBOTIC ASSISTED LAPAROSCOPIC HYSTERECTOMY AND SALPINGECTOMY
Anesthesia: General | Laterality: Bilateral

## 2020-04-14 MED ORDER — HYDROMORPHONE HCL 1 MG/ML IJ SOLN
0.2500 mg | INTRAMUSCULAR | Status: DC | PRN
  Administered 2020-04-14 (×4): 0.5 mg via INTRAVENOUS

## 2020-04-14 MED ORDER — LIDOCAINE 2% (20 MG/ML) 5 ML SYRINGE
INTRAMUSCULAR | Status: DC | PRN
  Administered 2020-04-14: 40 mg via INTRAVENOUS

## 2020-04-14 MED ORDER — ROCURONIUM BROMIDE 10 MG/ML (PF) SYRINGE
PREFILLED_SYRINGE | INTRAVENOUS | Status: DC | PRN
  Administered 2020-04-14: 10 mg via INTRAVENOUS
  Administered 2020-04-14: 50 mg via INTRAVENOUS
  Administered 2020-04-14: 30 mg via INTRAVENOUS

## 2020-04-14 MED ORDER — MEPERIDINE HCL 25 MG/ML IJ SOLN
INTRAMUSCULAR | Status: AC
  Filled 2020-04-14: qty 1

## 2020-04-14 MED ORDER — SODIUM CHLORIDE 0.9% IV SOLUTION: 250.0000 mL | Freq: Once | INTRAVENOUS | Status: DC

## 2020-04-14 MED ORDER — DEXAMETHASONE SODIUM PHOSPHATE 10 MG/ML IJ SOLN
INTRAMUSCULAR | Status: DC | PRN
  Administered 2020-04-14: 10 mg via INTRAVENOUS

## 2020-04-14 MED ORDER — MIDAZOLAM HCL 5 MG/5ML IJ SOLN
INTRAMUSCULAR | Status: DC | PRN
  Administered 2020-04-14: 2 mg via INTRAVENOUS

## 2020-04-14 MED ORDER — HEPARIN SODIUM (PORCINE) 5000 UNIT/ML IJ SOLN
5000.0000 [IU] | Freq: Three times a day (TID) | INTRAMUSCULAR | Status: DC
  Administered 2020-04-14 – 2020-04-15 (×3): 5000 [IU] via SUBCUTANEOUS
  Filled 2020-04-14 (×3): qty 1

## 2020-04-14 MED ORDER — OXYCODONE-ACETAMINOPHEN 5-325 MG PO TABS
2.0000 | ORAL_TABLET | ORAL | Status: DC | PRN
  Administered 2020-04-14 – 2020-04-15 (×3): 2 via ORAL
  Filled 2020-04-14 (×3): qty 2

## 2020-04-14 MED ORDER — PHENYLEPHRINE HCL (PRESSORS) 10 MG/ML IV SOLN
INTRAVENOUS | Status: DC | PRN
  Administered 2020-04-14: 80 ug via INTRAVENOUS

## 2020-04-14 MED ORDER — FENTANYL CITRATE (PF) 100 MCG/2ML IJ SOLN
INTRAMUSCULAR | Status: AC
  Filled 2020-04-14: qty 2

## 2020-04-14 MED ORDER — SUGAMMADEX SODIUM 200 MG/2ML IV SOLN
INTRAVENOUS | Status: DC | PRN
  Administered 2020-04-14: 200 mg via INTRAVENOUS

## 2020-04-14 MED ORDER — CEFAZOLIN SODIUM-DEXTROSE 2-4 GM/100ML-% IV SOLN
2.0000 g | INTRAVENOUS | Status: AC
  Administered 2020-04-14: 2 g via INTRAVENOUS
  Filled 2020-04-14: qty 100

## 2020-04-14 MED ORDER — BUPIVACAINE HCL (PF) 0.25 % IJ SOLN
INTRAMUSCULAR | Status: AC
  Filled 2020-04-14: qty 60

## 2020-04-14 MED ORDER — HYDROMORPHONE HCL 1 MG/ML IJ SOLN
0.5000 mg | INTRAMUSCULAR | Status: DC | PRN
  Administered 2020-04-14 – 2020-04-15 (×2): 0.5 mg via INTRAVENOUS
  Filled 2020-04-14 (×2): qty 1

## 2020-04-14 MED ORDER — 0.9 % SODIUM CHLORIDE (POUR BTL) OPTIME
TOPICAL | Status: DC | PRN
  Administered 2020-04-14: 1000 mL

## 2020-04-14 MED ORDER — ONDANSETRON HCL 4 MG PO TABS
4.0000 mg | ORAL_TABLET | Freq: Three times a day (TID) | ORAL | Status: DC | PRN
  Administered 2020-04-14: 4 mg via ORAL
  Filled 2020-04-14: qty 1

## 2020-04-14 MED ORDER — DIPHENHYDRAMINE HCL 50 MG/ML IJ SOLN
INTRAMUSCULAR | Status: DC | PRN
  Administered 2020-04-14: 12.5 mg via INTRAVENOUS

## 2020-04-14 MED ORDER — OXYCODONE HCL 5 MG PO TABS: 5.0000 mg | ORAL_TABLET | Freq: Once | ORAL | Status: DC | PRN

## 2020-04-14 MED ORDER — SCOPOLAMINE 1 MG/3DAYS TD PT72
MEDICATED_PATCH | TRANSDERMAL | Status: AC
  Filled 2020-04-14: qty 1

## 2020-04-14 MED ORDER — HYDROMORPHONE HCL 1 MG/ML IJ SOLN
INTRAMUSCULAR | Status: AC
  Filled 2020-04-14: qty 1

## 2020-04-14 MED ORDER — POVIDONE-IODINE 10 % EX SWAB: 2.0000 "application " | Freq: Once | CUTANEOUS | Status: DC

## 2020-04-14 MED ORDER — ONDANSETRON HCL 4 MG/2ML IJ SOLN
INTRAMUSCULAR | Status: DC | PRN
  Administered 2020-04-14: 4 mg via INTRAVENOUS

## 2020-04-14 MED ORDER — DEXMEDETOMIDINE HCL IN NACL 80 MCG/20ML IV SOLN
INTRAVENOUS | Status: AC
  Filled 2020-04-14: qty 20

## 2020-04-14 MED ORDER — LACTATED RINGERS IV SOLN: INTRAVENOUS | Status: DC

## 2020-04-14 MED ORDER — MEPERIDINE HCL 25 MG/ML IJ SOLN
6.2500 mg | INTRAMUSCULAR | Status: DC | PRN
  Administered 2020-04-14 (×2): 12.5 mg via INTRAVENOUS

## 2020-04-14 MED ORDER — FENTANYL CITRATE (PF) 100 MCG/2ML IJ SOLN
25.0000 ug | INTRAMUSCULAR | Status: DC | PRN
  Administered 2020-04-14 (×2): 50 ug via INTRAVENOUS

## 2020-04-14 MED ORDER — DIPHENHYDRAMINE HCL 50 MG/ML IJ SOLN
INTRAMUSCULAR | Status: AC
  Filled 2020-04-14: qty 1

## 2020-04-14 MED ORDER — ORAL CARE MOUTH RINSE: 15.0000 mL | Freq: Once | OROMUCOSAL | Status: AC

## 2020-04-14 MED ORDER — LACTATED RINGERS IV SOLN: INTRAVENOUS | Status: DC | PRN

## 2020-04-14 MED ORDER — CHLORHEXIDINE GLUCONATE 0.12 % MT SOLN
15.0000 mL | Freq: Once | OROMUCOSAL | Status: AC
  Administered 2020-04-14: 15 mL via OROMUCOSAL
  Filled 2020-04-14: qty 15

## 2020-04-14 MED ORDER — OXYCODONE HCL 5 MG/5ML PO SOLN: 5.0000 mg | Freq: Once | ORAL | Status: DC | PRN

## 2020-04-14 MED ORDER — KETOROLAC TROMETHAMINE 30 MG/ML IJ SOLN
INTRAMUSCULAR | Status: DC | PRN
  Administered 2020-04-14: 30 mg via INTRAVENOUS

## 2020-04-14 MED ORDER — ACETAMINOPHEN 325 MG PO TABS: 650.0000 mg | ORAL_TABLET | ORAL | Status: DC | PRN

## 2020-04-14 MED ORDER — FENTANYL CITRATE (PF) 250 MCG/5ML IJ SOLN
INTRAMUSCULAR | Status: AC
  Filled 2020-04-14: qty 5

## 2020-04-14 MED ORDER — FENTANYL CITRATE (PF) 100 MCG/2ML IJ SOLN
INTRAMUSCULAR | Status: DC | PRN
  Administered 2020-04-14 (×5): 50 ug via INTRAVENOUS

## 2020-04-14 MED ORDER — ONDANSETRON HCL 4 MG/2ML IJ SOLN
INTRAMUSCULAR | Status: AC
  Filled 2020-04-14: qty 2

## 2020-04-14 MED ORDER — HYDROCODONE-ACETAMINOPHEN 5-325 MG PO TABS
1.0000 | ORAL_TABLET | ORAL | Status: DC | PRN
  Administered 2020-04-14: 1 via ORAL
  Administered 2020-04-14: 2 via ORAL
  Administered 2020-04-14: 1 via ORAL
  Filled 2020-04-14: qty 2
  Filled 2020-04-14 (×2): qty 1

## 2020-04-14 MED ORDER — NAPHAZOLINE-GLYCERIN 0.012-0.2 % OP SOLN
1.0000 [drp] | Freq: Four times a day (QID) | OPHTHALMIC | Status: DC | PRN
  Administered 2020-04-14: 1 [drp] via OPHTHALMIC
  Filled 2020-04-14 (×2): qty 15

## 2020-04-14 MED ORDER — SCOPOLAMINE 1 MG/3DAYS TD PT72
MEDICATED_PATCH | TRANSDERMAL | Status: DC | PRN
  Administered 2020-04-14: 1 via TRANSDERMAL

## 2020-04-14 MED ORDER — PROPOFOL 10 MG/ML IV BOLUS
INTRAVENOUS | Status: AC
  Filled 2020-04-14: qty 20

## 2020-04-14 MED ORDER — PHENYLEPHRINE 40 MCG/ML (10ML) SYRINGE FOR IV PUSH (FOR BLOOD PRESSURE SUPPORT)
PREFILLED_SYRINGE | INTRAVENOUS | Status: AC
  Filled 2020-04-14: qty 10

## 2020-04-14 MED ORDER — MIDAZOLAM HCL 2 MG/2ML IJ SOLN
INTRAMUSCULAR | Status: AC
  Filled 2020-04-14: qty 2

## 2020-04-14 MED ORDER — PROPOFOL 10 MG/ML IV BOLUS
INTRAVENOUS | Status: DC | PRN
  Administered 2020-04-14: 130 mg via INTRAVENOUS

## 2020-04-14 MED ORDER — ONDANSETRON HCL 4 MG/2ML IJ SOLN: 4.0000 mg | Freq: Once | INTRAMUSCULAR | Status: DC | PRN

## 2020-04-14 MED ORDER — BUPIVACAINE HCL (PF) 0.25 % IJ SOLN
INTRAMUSCULAR | Status: DC | PRN
  Administered 2020-04-14: 19 mL

## 2020-04-14 SURGICAL SUPPLY — 97 items
ADH SKN CLS APL DERMABOND .7 (GAUZE/BANDAGES/DRESSINGS) ×1
BARRIER ADHS 3X4 INTERCEED (GAUZE/BANDAGES/DRESSINGS) IMPLANT
BLADE SURG 10 STRL SS (BLADE) ×4 IMPLANT
BRR ADH 4X3 ABS CNTRL BYND (GAUZE/BANDAGES/DRESSINGS)
CANISTER SUCT 3000ML PPV (MISCELLANEOUS) ×2 IMPLANT
CATH FOLEY 2WAY SLVR 30CC 16FR (CATHETERS) IMPLANT
CATH FOLEY 3WAY  5CC 16FR (CATHETERS) ×2
CATH FOLEY 3WAY 5CC 16FR (CATHETERS) ×1 IMPLANT
COVER BACK TABLE 60X90IN (DRAPES) ×2 IMPLANT
COVER TIP SHEARS 8 DVNC (MISCELLANEOUS) ×1 IMPLANT
COVER TIP SHEARS 8MM DA VINCI (MISCELLANEOUS) ×2
COVER WAND RF STERILE (DRAPES) ×2 IMPLANT
DECANTER SPIKE VIAL GLASS SM (MISCELLANEOUS) ×4 IMPLANT
DEFOGGER SCOPE WARMER CLEARIFY (MISCELLANEOUS) ×2 IMPLANT
DERMABOND ADVANCED (GAUZE/BANDAGES/DRESSINGS) ×1
DERMABOND ADVANCED .7 DNX12 (GAUZE/BANDAGES/DRESSINGS) ×1 IMPLANT
DRAPE ARM DVNC X/XI (DISPOSABLE) ×4 IMPLANT
DRAPE COLUMN DVNC XI (DISPOSABLE) ×1 IMPLANT
DRAPE DA VINCI XI ARM (DISPOSABLE) ×8
DRAPE DA VINCI XI COLUMN (DISPOSABLE) ×2
DRAPE UTILITY XL STRL (DRAPES) ×2 IMPLANT
DRAPE WARM FLUID 44X44 (DRAPES) IMPLANT
DURAPREP 26ML APPLICATOR (WOUND CARE) ×2 IMPLANT
ELECT REM PT RETURN 9FT ADLT (ELECTROSURGICAL) ×2
ELECTRODE REM PT RTRN 9FT ADLT (ELECTROSURGICAL) ×1 IMPLANT
GAUZE 4X4 16PLY RFD (DISPOSABLE) ×2 IMPLANT
GAUZE PETROLATUM 1 X8 (GAUZE/BANDAGES/DRESSINGS) ×2 IMPLANT
GAUZE VASELINE 3X9 (GAUZE/BANDAGES/DRESSINGS) ×1 IMPLANT
GLOVE BIO SURGEON STRL SZ 6.5 (GLOVE) ×6 IMPLANT
GLOVE BIO SURGEON STRL SZ8 (GLOVE) IMPLANT
GLOVE BIOGEL PI IND STRL 7.0 (GLOVE) ×5 IMPLANT
GLOVE BIOGEL PI INDICATOR 7.0 (GLOVE) ×5
GLOVE SRG 8 PF TXTR STRL LF DI (GLOVE) IMPLANT
GLOVE SURG UNDER POLY LF SZ8 (GLOVE)
GOWN STRL REUS W/ TWL LRG LVL3 (GOWN DISPOSABLE) ×3 IMPLANT
GOWN STRL REUS W/ TWL XL LVL3 (GOWN DISPOSABLE) IMPLANT
GOWN STRL REUS W/TWL LRG LVL3 (GOWN DISPOSABLE) ×6
GOWN STRL REUS W/TWL XL LVL3 (GOWN DISPOSABLE)
HIBICLENS CHG 4% 4OZ BTL (MISCELLANEOUS) ×2 IMPLANT
IRRIGATION STRYKERFLOW (MISCELLANEOUS) ×1 IMPLANT
IRRIGATOR STRYKERFLOW (MISCELLANEOUS) ×2
KIT TURNOVER KIT B (KITS) ×2 IMPLANT
LEGGING LITHOTOMY PAIR STRL (DRAPES) ×2 IMPLANT
NEEDLE HYPO 22GX1.5 SAFETY (NEEDLE) ×2 IMPLANT
OBTURATOR OPTICAL STANDARD 8MM (TROCAR) ×2
OBTURATOR OPTICAL STND 8 DVNC (TROCAR) ×1
OBTURATOR OPTICALSTD 8 DVNC (TROCAR) ×1 IMPLANT
OCCLUDER COLPOPNEUMO (BALLOONS) ×2 IMPLANT
PACK ABDOMINAL GYN (CUSTOM PROCEDURE TRAY) ×2 IMPLANT
PACK ROBOT WH (CUSTOM PROCEDURE TRAY) ×2 IMPLANT
PACK ROBOTIC GOWN (GOWN DISPOSABLE) ×2 IMPLANT
PACK TRENDGUARD 450 HYBRID PRO (MISCELLANEOUS) ×1 IMPLANT
PAD OB MATERNITY 4.3X12.25 (PERSONAL CARE ITEMS) ×2 IMPLANT
PLUG CATH AND CAP STER (CATHETERS) IMPLANT
POUCH ENDO CATCH II 15MM (MISCELLANEOUS) IMPLANT
PROTECTOR NERVE ULNAR (MISCELLANEOUS) ×4 IMPLANT
RTRCTR WOUND ALEXIS 18CM SML (INSTRUMENTS)
SAVER CELL AAL HAEMONETICS (INSTRUMENTS) IMPLANT
SEAL CANN UNIV 5-8 DVNC XI (MISCELLANEOUS) ×4 IMPLANT
SEAL XI 5MM-8MM UNIVERSAL (MISCELLANEOUS) ×8
SET IRRIG Y TYPE TUR BLADDER L (SET/KITS/TRAYS/PACK) IMPLANT
SET TRI-LUMEN FLTR TB AIRSEAL (TUBING) ×2 IMPLANT
SPONGE LAP 18X18 RF (DISPOSABLE) ×4 IMPLANT
STAPLER VISISTAT 35W (STAPLE) ×2 IMPLANT
SUT ETHIBOND 0 (SUTURE) IMPLANT
SUT SILK 3 0 SH 30 (SUTURE) IMPLANT
SUT VIC AB 0 CT1 18XCR BRD8 (SUTURE) IMPLANT
SUT VIC AB 0 CT1 27 (SUTURE) ×4
SUT VIC AB 0 CT1 27XBRD ANBCTR (SUTURE) ×2 IMPLANT
SUT VIC AB 0 CT1 8-18 (SUTURE)
SUT VIC AB 2-0 CT1 27 (SUTURE)
SUT VIC AB 2-0 CT1 TAPERPNT 27 (SUTURE) IMPLANT
SUT VIC AB 3-0 SH 27 (SUTURE)
SUT VIC AB 3-0 SH 27X BRD (SUTURE) IMPLANT
SUT VIC AB 4-0 PS2 18 (SUTURE) ×1 IMPLANT
SUT VIC AB 4-0 PS2 27 (SUTURE) ×6 IMPLANT
SUT VIC AB 4-0 RB1 27 (SUTURE)
SUT VIC AB 4-0 RB1 27X BRD (SUTURE) IMPLANT
SUT VICRYL 0 UR6 27IN ABS (SUTURE) ×2 IMPLANT
SUT VICRYL 2 0 18  UND BR (SUTURE) ×2
SUT VICRYL 2 0 18 UND BR (SUTURE) ×1 IMPLANT
SUT VLOC 180 0 9IN  GS21 (SUTURE) ×2
SUT VLOC 180 0 9IN GS21 (SUTURE) ×1 IMPLANT
SUT VLOC 180 2-0 6IN GS21 (SUTURE) IMPLANT
SYR CONTROL 10ML LL (SYRINGE) ×2 IMPLANT
TIP RUMI ORANGE 6.7MMX12CM (TIP) IMPLANT
TIP UTERINE 5.1X6CM LAV DISP (MISCELLANEOUS) IMPLANT
TIP UTERINE 6.7X10CM GRN DISP (MISCELLANEOUS) ×1 IMPLANT
TIP UTERINE 6.7X6CM WHT DISP (MISCELLANEOUS) IMPLANT
TIP UTERINE 6.7X8CM BLUE DISP (MISCELLANEOUS) IMPLANT
TOWEL GREEN STERILE (TOWEL DISPOSABLE) ×2 IMPLANT
TOWEL GREEN STERILE FF (TOWEL DISPOSABLE) ×4 IMPLANT
TRAY FOLEY W/BAG SLVR 14FR (SET/KITS/TRAYS/PACK) ×2 IMPLANT
TRENDGUARD 450 HYBRID PRO PACK (MISCELLANEOUS) ×2
TROCAR PORT AIRSEAL 5X120 (TROCAR) ×2 IMPLANT
UNDERPAD 30X36 HEAVY ABSORB (UNDERPADS AND DIAPERS) ×2 IMPLANT
WATER STERILE IRR 1000ML POUR (IV SOLUTION) ×2 IMPLANT

## 2020-04-14 NOTE — Transfer of Care (Signed)
Immediate Anesthesia Transfer of Care Note  Patient: [name]  Procedure(s) Performed: XI ROBOTIC ASSISTED TOTAL LAPAROSCOPIC HYSTERECTOMY AND SALPINGECTOMY (Bilateral )  Patient Location: PACU  Anesthesia Type:General  Level of Consciousness: awake, alert  and oriented  Airway & Oxygen Therapy: Patient Spontanous Breathing and Patient connected to nasal cannula oxygen  Post-op Assessment: Report given to RN and Post -op Vital signs reviewed and stable  Post vital signs: Reviewed and stable  Last Vitals:  Vitals Value Taken Time  BP 151/92 04/14/20 1157  Temp 37 C 04/14/20 1155  Pulse 104 04/14/20 1159  Resp 14 04/14/20 1159  SpO2 100 % 04/14/20 1159  Vitals shown include unvalidated device data.  Last Pain:  Vitals:   04/14/20 1155  TempSrc:   PainSc: Asleep      Patients Stated Pain Goal: 3 (81/59/47 0761)  Complications: No complications documented.

## 2020-04-14 NOTE — Anesthesia Preprocedure Evaluation (Signed)
Anesthesia Evaluation  Patient identified by MRN, date of birth, ID band Patient awake    Reviewed: Allergy & Precautions, NPO status , Patient's Chart, lab work & pertinent test results  Airway Mallampati: II  TM Distance: >3 FB Neck ROM: Full    Dental  (+) Teeth Intact, Dental Advisory Given   Pulmonary former smoker,    breath sounds clear to auscultation       Cardiovascular  Rhythm:Regular Rate:Normal     Neuro/Psych    GI/Hepatic   Endo/Other    Renal/GU      Musculoskeletal   Abdominal   Peds  Hematology   Anesthesia Other Findings   Reproductive/Obstetrics                             Anesthesia Physical Anesthesia Plan  ASA: II  Anesthesia Plan: General   Post-op Pain Management:    Induction: Intravenous  PONV Risk Score and Plan: Ondansetron and Dexamethasone  Airway Management Planned: Oral ETT  Additional Equipment:   Intra-op Plan:   Post-operative Plan: Extubation in OR  Informed Consent: I have reviewed the patients History and Physical, chart, labs and discussed the procedure including the risks, benefits and alternatives for the proposed anesthesia with the patient or authorized representative who has indicated his/her understanding and acceptance.     Dental advisory given  Plan Discussed with: CRNA and Anesthesiologist  Anesthesia Plan Comments:         Anesthesia Quick Evaluation

## 2020-04-14 NOTE — H&P (Signed)
@LOGO @  [name] is an 50 y.o. female [phone] s/p BT/S  R:  XI Robotic TLH/Bilateral Salpingectomy  HPI: Surgery rescheduled as patient tested positive for Covid-19 at last preop testing.  Started bleeding heavily again 3 days ago, restarted on Megace yesterday PM after she called to inform us. Was on the Progestin only BCPs.  Was not able to receive her Iron Infusions while she was Covid positive.  CBC on 04/07/20 was good at 11.4.  Has Blood cross-matched from family members for transfusion as needed.   Pertinent Gynecological History: Menses: Menometrorrhagia Contraception:BT/S, oral progesterone-only contraceptive Blood transfusions: yes Laparotomy: Left Ovarian benign cyst removed BT/S Sexually transmitted diseases: no past history Last mammogram: normal  Last pap: normal  OB History: [id number]   Menstrual History: Patient's last menstrual period was 03/21/2020.    Past Medical History:  Diagnosis Date  . Anemia    iron deficiency   . Blood transfusion without reported diagnosis   . Cardiac arrhythmia   . Cellulitis   . Chronic headaches   . Chronic lumbar pain since 2013   sees Dr. Arvilla Market   . Clotting disorder ([place])    PE prior to pregnancy.  . Complication of anesthesia    Bp drops and alot of shaking after anesthesia  . COVID-19 03/01/2020  . Interstitial cystitis   . Palpitations 02/19/2018  . PE (pulmonary embolism)    due to surgery  . Pneumonia   . PONV (postoperative nausea and vomiting)   . Sepsis ([place])    due to surgery  . Staph infection     Past Surgical History:  Procedure Laterality Date  . BREAST REDUCTION SURGERY    . COLONOSCOPY  06/25/2019   per Dr. Havery Moros, adeomatous polyps, repeat in 3 yrs   . DILATION AND CURETTAGE OF UTERUS    . EXPLORATORY LAPAROTOMY     with left ovary and mass removed  . RADIOFREQUENCY ABLATION NERVES     lumbar spine, per Dr. Ace Gins   . TUBAL LIGATION    . UPPER GASTROINTESTINAL ENDOSCOPY       Family History  Problem Relation Age of Onset  . Colon polyps Mother   . Irritable bowel syndrome Mother   . Heart defect Mother        tachycardia  . Colon polyps Paternal Grandmother   . Hypertension Paternal Grandmother   . Heart defect Paternal Grandmother   . Heart attack Paternal Grandmother   . Colon polyps Maternal Grandmother   . Hypertension Maternal Grandmother   . Diabetes Paternal Grandfather   . Heart disease Paternal Grandfather   . Hypertension Paternal Grandfather   . Heart attack Paternal Grandfather   . Diabetes Maternal Grandfather   . Heart disease Maternal Grandfather   . Hypertension Maternal Grandfather   . Diabetes Sister   . Hypertension Sister   . Heart Problems Sister   . Irritable bowel syndrome Paternal Aunt   . Cancer Paternal Aunt        melanoma  . Liver disease Father   . Hypertension Father   . COPD Father   . Colon cancer Neg Hx   . Esophageal cancer Neg Hx   . Rectal cancer Neg Hx   . Stomach cancer Neg Hx     Social History:  reports that she has quit smoking. Her smoking use included cigarettes. She quit after 1.00 year of use. She has never used smokeless tobacco. She reports current alcohol use. She reports that  she does not use drugs.  Allergies: No Known Allergies  Medications Prior to Admission  Medication Sig Dispense Refill Last Dose  . clarithromycin (BIAXIN) 500 MG tablet Take 500 mg by mouth 2 (two) times daily.     . metroNIDAZOLE (FLAGYL) 500 MG tablet Take 500 mg by mouth 2 (two) times daily.     Marland Kitchen acetaminophen (TYLENOL) 500 MG tablet Take 1,000 mg by mouth every 8 (eight) hours as needed for moderate pain.     Marland Kitchen amoxicillin (AMOXIL) 500 MG capsule Take 1,000 mg by mouth 2 (two) times daily.     . cholecalciferol (VITAMIN D3) 25 MCG (1000 UNIT) tablet Take 1,000 Units by mouth daily.     . cyclobenzaprine (FLEXERIL) 10 MG tablet Take 1 tablet (10 mg total) by mouth 3 (three) times daily as needed for muscle  spasms. 60 tablet 2   . eszopiclone (LUNESTA) 1 MG TABS tablet Take 1 tablet (1 mg total) by mouth at bedtime as needed for sleep. Take immediately before bedtime 15 tablet 0   . gabapentin (NEURONTIN) 300 MG capsule Take 300 mg by mouth 3 (three) times daily as needed (pain).      Marland Kitchen ibuprofen (ADVIL,MOTRIN) 200 MG tablet Take 600 mg by mouth every 8 (eight) hours as needed for moderate pain.      . megestrol (MEGACE) 40 MG tablet Take two tabs po now and take two tabs po in the am with a sip of water. 4 tablet 0   . norethindrone (MICRONOR) 0.35 MG tablet TAKE 1 TABLET BY MOUTH EVERY DAY (Patient not taking: Reported on 04/07/2020) 28 tablet 3 Not Taking at Unknown time  . omeprazole (PRILOSEC) 40 MG capsule Take 1 capsule (40 mg total) by mouth in the morning and at bedtime for 14 days. 28 capsule 0   . traZODone (DESYREL) 50 MG tablet Take 1-2 tablets (50-100 mg total) by mouth at bedtime. (Patient taking differently: Take 50-100 mg by mouth at bedtime as needed for sleep.) 60 tablet 3   . vitamin B-12 (CYANOCOBALAMIN) 1000 MCG tablet Take 1,000 mcg by mouth daily.       REVIEW OF SYSTEMS: A ROS was performed and pertinent positives and negatives are included in the history.  GENERAL: No fevers or chills. HEENT: No change in vision, no earache, sore throat or sinus congestion. NECK: No pain or stiffness. CARDIOVASCULAR: No chest pain or pressure. No palpitations. PULMONARY: No shortness of breath, cough or wheeze. GASTROINTESTINAL: No abdominal pain, nausea, vomiting or diarrhea, melena or bright red blood per rectum. GENITOURINARY: No urinary frequency, urgency, hesitancy or dysuria. MUSCULOSKELETAL: No joint or muscle pain, no back pain, no recent trauma. DERMATOLOGIC: No rash, no itching, no lesions. ENDOCRINE: No polyuria, polydipsia, no heat or cold intolerance. No recent change in weight. HEMATOLOGICAL: No anemia or easy bruising or bleeding. NEUROLOGIC: No headache, seizures, numbness, tingling  or weakness. PSYCHIATRIC: No depression, no loss of interest in normal activity or change in sleep pattern.     Last menstrual period 03/21/2020.  Physical Exam:  See office notes  Pelvic US 08/14/2019:  Comparison is made with previous scan in 2019. T/V images. Anteverted enlarged uterus with multiple intramural and subserosal fibroids with no significant change in size of the uterus or fibroids since previous scan. The uterus is measured at 11.62 x 8.55 x 7.78 cm. The endometrial lining is normal and thinmeasured at 5.2 mm. The cavity is distorted by adjacent fibroids. No mass or thickening of the  endometrium is seen. Both ovaries are normal in size with normal follicular pattern. No adnexal mass. No free fluid in the posterior cul-de-sac.  Hb 04/07/20 at 11.4 Covid pos 03/01/2020.  Surgery rescheduled, now asymptomatic Blood available at Blood Bank   Assessment/Plan:  50 y.o. [id number]   1. Menorrhagia with regular cycle Will proceed with XI Robotic TLH/Bilateral Salpingectomy on 04/14/2020. Surgery and risks thoroughly reviewed again.  Patient voiced understanding and agreement with plan.  2. Fibroids As above.  3. Anemia due to chronic blood loss Hb 11.4 on 04/07/2020.  Transfuse cross-matched blood from family members as needed.                        Patient was counseled as to the risk of surgery to include the following:  1. Infection (prohylactic antibiotics will be administered)  2. DVT/Pulmonary Embolism (prophylactic pneumo compression stockings will be used)  3.Trauma to internal organs requiring additional surgical procedure to repair any injury to internal organs requiring perhaps additional hospitalization days.  4.Hemmorhage requiring transfusion and blood products which carry risks such as anaphylactic reaction, hepatitis and AIDS  Patient had received literature information on the procedure scheduled and all her questions were answered and fully accepts  all risk.   Marie-Lyne Dima Mini 04/14/2020, 6:57 AM

## 2020-04-14 NOTE — Op Note (Signed)
Operative Note  04/14/2020  12:49 PM  PATIENT:  [name]  50 y.o. female  PRE-OPERATIVE DIAGNOSIS:  Menorrhagia , fibroids, anemia  POST-OPERATIVE DIAGNOSIS:  Menorrhagia , fibroids, anemia  PROCEDURE:  Procedure(s): XI ROBOTIC ASSISTED TOTAL LAPAROSCOPIC HYSTERECTOMY AND BILATERAL SALPINGECTOMY  SURGEON:  Surgeon(s): Princess Bruins, MD Joseph Pierini, MD  ANESTHESIA:   general  FINDINGS:  Uterus with Fibroids.  Bilateral tubes status post tubal ligation.  Ovaries normal.  Mild adhesion between left ovary and bowels.   DESCRIPTION OF OPERATION: Under general anesthesia with endotracheal intubation the patient is in lithotomy position.  She is prepped with DuraPrep on the abdomen and with Betadine on the suprapubic, vulvar and vaginal areas.  The patient is draped as usual.  Timeout is done.  The vaginal exam reveals an anteverted uterus mildly increased in size with fibroids and no adnexal mass.  The Foley catheter is inserted in the bladder.  A Rumi #10 with a medium Koh ring are put in place and the other instruments are removed.  We infiltrated the supraumbilical area with Marcaine one quarter plain.  We make a 1.5 cm incision at that level with the scalp L.  The aponeurosis is grasped with cokers and open under direct vision with Mayo scissors.  The parietal peritoneum is opened bluntly with the finger.  A pursestring stitch of Vicryl 0 is done on the aponeurosis.  The Sheryle Hail is inserted under direct vision at that level and up pneumoperitoneum is created with CO2.  Inspection of the abdominopelvic cavities reveal a nodular uterus with fibroids about 10 cm in diameter.  Both ovaries are normal in size and appearance with a minor ID lesion of the left ovary with the bowels.  Both tubes are status post tubal ligation.  No other adhesions or pathology in the pelvis.  The anterior wall of the abdomen is completely free for port insertions.  The skin is marked with the pen for 2  robotic ports in line with the umbilicus on the right and 1 robotic port at the distal left with the assistant port slightly higher than the umbilicus line medially on the left.  Small incisions are made with the scalp L and all ports are inserted under direct vision.  The patient is positioned in 30 degree Trendelenburg and tolerates it well.  The robot is docked.  Targeting is done.  Robotic instruments are inserted under direct vision with the fenestrated clamp in the fourth arm, the scissors in the third arm, the camera and the second arm and the bipolar PK in the first arm. I go to the console. Pictures are taken of all the pelvic structures. Both ureters are seen in normal anatomic position with good peristalsis. We start on the right side with cauterization and section of the right mesosalpinx. The right distal tube is removed from the abdomen through the assistant port. We then cauterized and section the right utero-ovarian ligament. We cauterized and section the right round ligament. We opened the broad ligament close to the right border of the uterus and opened the visceral peritoneum anteriorly to lower the bladder. We proceeded exactly the same way on the left side. We lower the bladder well past the [facility] ring. We then cauterized the right uterine artery. We cauterized the left uterine artery. We cauterized the backflow as well. The left uterine artery is sectioned and then the right uterine artery is sectioned. Hemostasis is adequate. The vaginal occluder is inflated. We opened the vagina over  the [facility] ring with the tip of the scissors anteriorly then posteriorly and we finished on the right and left sides. The uterus is completely detached with the cervix and the proximal aspect of the tubes. It is passed vaginally and sent to pathology with both distal tubes. The occluder is put back in place in the vagina. Hemostasis is completed with the bipolar PK at the vaginal cuff. We irrigate and suction the  pelvic cavity. Both ureters are seen with good peristalsis. The urine is clear. We change the robotic instruments to the cutting needle driver in the third arm and the long tip in the first arm. We use V-Loc zero at 9 inches to close the vaginal vault starting at the right angle and a running suture all the way to the left angle and then back to the middle. Hemostasis is adequate at all levels. Both ureters are seen again with good peristalsis. Urine is clear. The needle is removed from the abdominopelvic cavity. All robotic instruments are removed under direct vision. The robot is undocked. We go to laparoscopy time. The Trendelenburg is reversed and the pelvic cavity is suctioned. Hemostasis is adequate at all levels. Laparoscopic instruments are removed. All ports are removed. The CO2 is evacuated. The supraumbilical incision is closed by attaching the pursestring stitch at the aponeurosis. All skin incisions are closed with a subcuticular suture of Vicryl 4-0. Dermabond is added on all incisions. Hemostasis is adequate at all incision sites. The vaginal occluder is removed and hemostasis is adequate at that level as well. The patient is brought to recovery room in good and stable status.  ESTIMATED BLOOD LOSS: 25 mL   Intake/Output Summary (Last 24 hours) at 04/14/2020 1249 Last data filed at 04/14/2020 1101 Gross per 24 hour  Intake 1200 ml  Output 525 ml  Net 675 ml     BLOOD ADMINISTERED:none   LOCAL MEDICATIONS USED:  MARCAINE     SPECIMEN:  Source of Specimen:  Uterus with cervix and bilateral tubes  DISPOSITION OF SPECIMEN:  PATHOLOGY  COUNTS:  YES  PLAN OF CARE: Transfer to PACU  Marie-Lyne LavoieMD12:49 PM

## 2020-04-14 NOTE — Anesthesia Postprocedure Evaluation (Signed)
Anesthesia Post Note  Patient: [name]  Procedure(s) Performed: XI ROBOTIC ASSISTED TOTAL LAPAROSCOPIC HYSTERECTOMY AND SALPINGECTOMY (Bilateral )     Patient location during evaluation: PACU Anesthesia Type: General Level of consciousness: awake and alert Pain management: pain level controlled Vital Signs Assessment: post-procedure vital signs reviewed and stable Respiratory status: spontaneous breathing, nonlabored ventilation, respiratory function stable and patient connected to nasal cannula oxygen Cardiovascular status: blood pressure returned to baseline and stable Postop Assessment: no apparent nausea or vomiting Anesthetic complications: no   No complications documented.  Last Vitals:  Vitals:   04/14/20 1649 04/14/20 1730  BP:    Pulse:    Resp: 18 18  Temp:    SpO2:      Last Pain:  Vitals:   04/14/20 1730  TempSrc:   PainSc: 2                  Janifer Gieselman COKER

## 2020-04-14 NOTE — Anesthesia Procedure Notes (Signed)
Procedure Name: Intubation Date/Time: 04/14/2020 8:48 AM Performed by: Inda Coke, CRNA Pre-anesthesia Checklist: Patient identified, Emergency Drugs available, Suction available and Patient being monitored Patient Re-evaluated:Patient Re-evaluated prior to induction Oxygen Delivery Method: Circle System Utilized Preoxygenation: Pre-oxygenation with 100% oxygen Induction Type: IV induction Ventilation: Mask ventilation without difficulty Laryngoscope Size: Mac and 3 Grade View: Grade I Tube type: Oral Tube size: 7.0 mm Number of attempts: 1 Airway Equipment and Method: Stylet and Oral airway Placement Confirmation: ETT inserted through vocal cords under direct vision,  positive ETCO2 and breath sounds checked- equal and bilateral Secured at: 22 cm Tube secured with: Tape Dental Injury: Teeth and Oropharynx as per pre-operative assessment

## 2020-04-15 ENCOUNTER — Encounter (HOSPITAL_COMMUNITY): Payer: Self-pay | Admitting: Obstetrics & Gynecology

## 2020-04-15 DIAGNOSIS — N920 Excessive and frequent menstruation with regular cycle: Secondary | ICD-10-CM | POA: Diagnosis not present

## 2020-04-15 LAB — SURGICAL PATHOLOGY

## 2020-04-15 MED ORDER — IBUPROFEN 800 MG PO TABS: 800.0000 mg | ORAL_TABLET | Freq: Three times a day (TID) | ORAL | 0 refills | Status: DC | PRN

## 2020-04-15 MED ORDER — OXYCODONE-ACETAMINOPHEN 7.5-325 MG PO TABS: 1.0000 | ORAL_TABLET | Freq: Four times a day (QID) | ORAL | 0 refills | Status: DC | PRN

## 2020-04-15 MED ORDER — ONDANSETRON HCL 4 MG PO TABS: 4.0000 mg | ORAL_TABLET | Freq: Three times a day (TID) | ORAL | 0 refills | Status: DC | PRN

## 2020-04-15 NOTE — Discharge Instructions (Signed)
Total Laparoscopic Hysterectomy, Care After The following information offers guidance on how to care for yourself after your procedure. Your health care provider may also give you more specific instructions. If you have problems or questions, contact your health care provider. What can I expect after the procedure? After the procedure, it is common to have:  Pain, bruising, and numbness around your incisions.  Tiredness (fatigue).  Poor appetite.  Less interest in sex.  Vaginal discharge or bleeding. You will need to use a sanitary pad after this procedure.  Feelings of sadness or other emotions. If your ovaries were also removed, it is also common to have symptoms of menopause, such as hot flashes, night sweats, and lack of sleep (insomnia). Follow these instructions at home: Medicines  Take over-the-counter and prescription medicines only as told by your health care provider.  Ask your health care provider if the medicine prescribed to you: ? Requires you to avoid driving or using machinery. ? Can cause constipation. You may need to take these actions to prevent or treat constipation:  Drink enough fluid to keep your urine pale yellow.  Take over-the-counter or prescription medicines.  Eat foods that are high in fiber, such as beans, whole grains, and fresh fruits and vegetables.  Limit foods that are high in fat and processed sugars, such as fried or sweet foods. Incision care  Follow instructions from your health care provider about how to take care of your incisions. Make sure you: ? Wash your hands with soap and water for at least 20 seconds before and after you change your bandage (dressing). If soap and water are not available, use hand sanitizer. ? Change your dressing as told by your health care provider. ? Leave stitches (sutures), skin glue, or adhesive strips in place. These skin closures may need to stay in place for 2 weeks or longer. If adhesive strip edges start  to loosen and curl up, you may trim the loose edges. Do not remove adhesive strips completely unless your health care provider tells you to do that.  Check your incision areas every day for signs of infection. Check for: ? More redness, swelling, or pain. ? Fluid or blood. ? Warmth. ? Pus or a bad smell.   Activity  Rest as told by your health care provider.  Avoid sitting for a long time without moving. Get up to take short walks every 1-2 hours. This is important to improve blood flow and breathing. Ask for help if you feel weak or unsteady.  Return to your normal activities as told by your health care provider. Ask your health care provider what activities are safe for you.  Do not lift anything that is heavier than 10 lb (4.5 kg), or the limit that you are told, for one month after surgery or until your health care provider says that it is safe.  If you were given a sedative during the procedure, it can affect you for several hours. Do not drive or operate machinery until your health care provider says that it is safe.   Lifestyle  Do not use any products that contain nicotine or tobacco. These products include cigarettes, chewing tobacco, and vaping devices, such as e-cigarettes. These can delay healing after surgery. If you need help quitting, ask your health care provider.  Do not drink alcohol until your health care provider approves. General instructions  Do not douche, use tampons, or have sex for at least 6 weeks, or as told by your health   care provider.  If you struggle with physical or emotional changes after your procedure, speak with your health care provider or a therapist.  Do not take baths, swim, or use a hot tub until your health care provider approves. You may only be allowed to take showers for 2-3 weeks.  Keep your dressing dry until your health care provider says it can be removed.  Try to have someone at home with you for the first 1-2 weeks to help with your  daily chores.  Wear compression stockings as told by your health care provider. These stockings help to prevent blood clots and reduce swelling in your legs.  Keep all follow-up visits. This is important.   Contact a health care provider if:  You have any of these signs of infection: ? Chills or a fever. ? More redness, swelling, or pain around an incision. ? Fluid or blood coming from an incision. ? Warmth coming from an incision. ? Pus or a bad smell coming from an incision.  An incision opens.  You feel dizzy or light-headed.  You have pain or bleeding when you urinate, or you are unable to urinate.  You have abnormal vaginal discharge.  You have pain that does not get better with medicine. Get help right away if:  You have a fever and your symptoms suddenly get worse.  You have severe abdominal pain.  You have chest pain or shortness of breath.  You faint.  You have pain, swelling, or redness in your leg.  You have heavy vaginal bleeding with blood clots, soaking through a sanitary pad in less than 1 hour. These symptoms may represent a serious problem that is an emergency. Do not wait to see if the symptoms will go away. Get medical help right away. Call your local emergency services (911 in the [place] Do not drive yourself to the hospital. Summary  After the procedure, it is common to have pain and bruising around your incisions.  Do not take baths, swim, or use a hot tub until your health care provider approves.  Do not lift anything that is heavier than 10 lb (4.5 kg), or the limit that you are told, for one month after surgery or until your health care provider says that it is safe.  Tell your health care provider if you have any signs or symptoms of infection after the procedure.  Get help right away if you have severe abdominal pain, chest pain, shortness of breath, or heavy bleeding from your vagina. This information is not intended to replace advice given  to you by your health care provider. Make sure you discuss any questions you have with your health care provider. Document Revised: 10/24/2019 Document Reviewed: 10/24/2019 Elsevier Patient Education  2021 [employer]  

## 2020-04-15 NOTE — Discharge Summary (Signed)
Physician Discharge Summary  Patient ID: [name] MRN: [id number] DOB/AGE: 04-02-1970 50 y.o.  Admit date: 04/14/2020 Discharge date: 04/15/2020  Admission Diagnoses: Postoperative state [Z98.890] Post-operative state [Z98.890]   Discharge Diagnoses:  Active Problems:   Postoperative state   Post-operative state   Discharged Condition: good  Consults:None  Significant Diagnostic Studies: labs: Postop Hb 10.4  Treatments:surgery: XI Robotic Total Laparoscopic Hysterectomy with Bilateral Salpingectomy  Vitals:   04/15/20 0416 04/15/20 1418  BP: (!) 101/48 128/79  Pulse: (!) 53 63  Resp: 18 17  Temp: 98.7 F (37.1 C) 98.9 F (37.2 C)  SpO2: 99% 100%     Total I/O In: 800 [P.O.:800] Out: -    Hospital Course: Good  Discharge Exam: Normal postop  Disposition: Discharge disposition: 01-Home or Self Care          Allergies as of 04/15/2020   No Known Allergies     Medication List    STOP taking these medications   acetaminophen 500 MG tablet Commonly known as: TYLENOL   megestrol 40 MG tablet Commonly known as: MEGACE   norethindrone 0.35 MG tablet Commonly known as: MICRONOR     TAKE these medications   amoxicillin 500 MG capsule Commonly known as: AMOXIL Take 1,000 mg by mouth 2 (two) times daily.   cholecalciferol 25 MCG (1000 UNIT) tablet Commonly known as: VITAMIN D3 Take 1,000 Units by mouth daily.   clarithromycin 500 MG tablet Commonly known as: BIAXIN Take 500 mg by mouth 2 (two) times daily.   cyclobenzaprine 10 MG tablet Commonly known as: FLEXERIL Take 1 tablet (10 mg total) by mouth 3 (three) times daily as needed for muscle spasms.   eszopiclone 1 MG Tabs tablet Commonly known as: LUNESTA Take 1 tablet (1 mg total) by mouth at bedtime as needed for sleep. Take immediately before bedtime   gabapentin 300 MG capsule Commonly known as: NEURONTIN Take 300 mg by mouth 3 (three) times daily as needed (pain).    ibuprofen 800 MG tablet Commonly known as: ADVIL Take 1 tablet (800 mg total) by mouth every 8 (eight) hours as needed. What changed:   medication strength  how much to take  reasons to take this   metroNIDAZOLE 500 MG tablet Commonly known as: FLAGYL Take 500 mg by mouth 2 (two) times daily.   omeprazole 40 MG capsule Commonly known as: PRILOSEC Take 1 capsule (40 mg total) by mouth in the morning and at bedtime for 14 days.   ondansetron 4 MG tablet Commonly known as: ZOFRAN Take 1 tablet (4 mg total) by mouth every 8 (eight) hours as needed for nausea or vomiting.   oxyCODONE-acetaminophen 7.5-325 MG tablet Commonly known as: Percocet Take 1 tablet by mouth every 6 (six) hours as needed for severe pain.   traZODone 50 MG tablet Commonly known as: DESYREL Take 1-2 tablets (50-100 mg total) by mouth at bedtime. What changed:   when to take this  reasons to take this   vitamin B-12 1000 MCG tablet Commonly known as: CYANOCOBALAMIN Take 1,000 mcg by mouth daily.         Follow-up Information    Princess Bruins, MD Follow up in 2 week(s).   Specialty: Obstetrics and Gynecology Contact information: [place] [address] [place] [address] [phone]                Signed: Princess Bruins 04/15/2020, 2:26 PM

## 2020-04-15 NOTE — Progress Notes (Signed)
[name] to be D/C'd  per MD order. Discussed with the patient and all questions fully answered.  VSS, Skin clean, dry and intact without evidence of skin break down, no evidence of skin tears noted.  IV catheter discontinued intact. Site without signs and symptoms of complications. Dressing and pressure applied.  An After Visit Summary was printed and given to the patient. Patient received prescription.  D/c education completed with patient/family including follow up instructions, medication list, d/c activities limitations if indicated, with other d/c instructions as indicated by MD - patient able to verbalize understanding, all questions fully answered.   Patient instructed to return to ED, call 911, or call MD for any changes in condition.   Patient to be escorted via [place], and D/C home via private auto.

## 2020-04-15 NOTE — Progress Notes (Signed)
POD#1 XI TLH/Bilateral Salpingectomy  Subjective: Patient reports tolerating PO and no problems voiding.    Objective: I have reviewed patient's vital signs.  vital signs, intake and output, medications and labs.  Vitals:   04/14/20 2110 04/15/20 0416  BP: (!) 99/55 (!) 101/48  Pulse: (!) 53 (!) 53  Resp: 16 18  Temp: 99 F (37.2 C) 98.7 F (37.1 C)  SpO2: 100% 99%   I/O last 3 completed shifts: In: 3038 [P.O.:120; I.V.:2918] Out: 525 [Urine:500; Blood:25] No intake/output data recorded.  Results for orders placed or performed during the hospital encounter of 04/14/20 (from the past 24 hour(s))  CBC     Status: Abnormal   Collection Time: 04/14/20  2:49 PM  Result Value Ref Range   WBC 9.7 4.0 - 10.5 K/uL   RBC 4.04 3.87 - 5.11 MIL/uL   Hemoglobin 10.1 (L) 12.0 - 15.0 g/dL   HCT 33.3 (L) 36.0 - 46.0 %   MCV 82.4 80.0 - 100.0 fL   MCH 25.0 (L) 26.0 - 34.0 pg   MCHC 30.3 30.0 - 36.0 g/dL   RDW 23.6 (H) 11.5 - 15.5 %   Platelets 372 150 - 400 K/uL   nRBC 0.0 0.0 - 0.2 %  Creatinine, serum     Status: None   Collection Time: 04/14/20  2:49 PM  Result Value Ref Range   Creatinine, Ser 0.60 0.44 - 1.00 mg/dL   GFR, Estimated >60 >60 mL/min    EXAM General: alert, cooperative and appears stated age Resp: clear to auscultation bilaterally Cardio: regular rate and rhythm GI: soft, non-tender; bowel sounds normal; no masses,  no organomegaly Extremities: no edema, redness or tenderness in the calves or thighs Vaginal Bleeding: minimal  Assessment: s/p Procedure(s): XI ROBOTIC ASSISTED TOTAL LAPAROSCOPIC HYSTERECTOMY AND SALPINGECTOMY: stable, progressing well and tolerating diet  Plan: Advance diet Encourage ambulation Advance to PO medication Discontinue IV fluids Discharge home  LOS: 0 days    Princess Bruins, MD 04/15/2020 2:12 PM    04/15/2020, 2:12 PM

## 2020-04-16 LAB — TYPE AND SCREEN
ABO/RH(D): O POS
Antibody Screen: NEGATIVE
Unit division: 0

## 2020-04-16 LAB — BPAM RBC
Blood Product Expiration Date: 202203182359
Unit Type and Rh: 5100

## 2020-04-23 ENCOUNTER — Encounter: Payer: Self-pay | Admitting: Anesthesiology

## 2020-04-28 ENCOUNTER — Ambulatory Visit: Payer: No Typology Code available for payment source | Admitting: Hematology and Oncology

## 2020-04-30 MED ORDER — ZOLPIDEM TARTRATE 10 MG PO TABS: 10.0000 mg | ORAL_TABLET | Freq: Every evening | ORAL | 0 refills | Status: DC | PRN

## 2020-04-30 NOTE — Telephone Encounter (Addendum)
Per Dr.Lavoie "Can send Ambien 10 mg PO HS #30. Sent my chart message asking if patient would like to try this.

## 2020-04-30 NOTE — Addendum Note (Signed)
Addended by: Thamas Jaegers on: 04/30/2020 04:35 PM   Modules accepted: Orders

## 2020-05-03 ENCOUNTER — Inpatient Hospital Stay: Payer: PRIVATE HEALTH INSURANCE | Attending: Hematology and Oncology

## 2020-05-03 ENCOUNTER — Other Ambulatory Visit: Payer: Self-pay

## 2020-05-03 VITALS — BP 108/59 | HR 69 | Temp 99.7°F | Resp 18

## 2020-05-03 DIAGNOSIS — D500 Iron deficiency anemia secondary to blood loss (chronic): Secondary | ICD-10-CM | POA: Insufficient documentation

## 2020-05-03 MED ORDER — SODIUM CHLORIDE 0.9 % IV SOLN
200.0000 mg | Freq: Once | INTRAVENOUS | Status: AC
  Administered 2020-05-03: 200 mg via INTRAVENOUS
  Filled 2020-05-03: qty 200

## 2020-05-03 MED ORDER — SODIUM CHLORIDE 0.9 % IV SOLN
Freq: Once | INTRAVENOUS | Status: AC
  Filled 2020-05-03: qty 250

## 2020-05-03 NOTE — Patient Instructions (Signed)

## 2020-05-04 ENCOUNTER — Encounter: Payer: Self-pay | Admitting: Obstetrics & Gynecology

## 2020-05-04 ENCOUNTER — Ambulatory Visit (INDEPENDENT_AMBULATORY_CARE_PROVIDER_SITE_OTHER): Payer: PRIVATE HEALTH INSURANCE | Admitting: Obstetrics & Gynecology

## 2020-05-04 VITALS — BP 140/90

## 2020-05-04 DIAGNOSIS — Z09 Encounter for follow-up examination after completed treatment for conditions other than malignant neoplasm: Secondary | ICD-10-CM | POA: Diagnosis not present

## 2020-05-04 DIAGNOSIS — R300 Dysuria: Secondary | ICD-10-CM | POA: Diagnosis not present

## 2020-05-04 MED ORDER — SULFAMETHOXAZOLE-TRIMETHOPRIM 800-160 MG PO TABS: 1.0000 | ORAL_TABLET | Freq: Two times a day (BID) | ORAL | 0 refills | Status: AC

## 2020-05-05 ENCOUNTER — Ambulatory Visit: Payer: 59 | Admitting: Cardiovascular Disease

## 2020-05-06 LAB — URINE CULTURE
MICRO NUMBER:: 11592397
SPECIMEN QUALITY:: ADEQUATE

## 2020-05-06 LAB — URINALYSIS, COMPLETE W/RFL CULTURE
Bilirubin Urine: NEGATIVE
Glucose, UA: NEGATIVE
Hyaline Cast: NONE SEEN /LPF
Nitrites, Initial: NEGATIVE
Protein, ur: NEGATIVE
Specific Gravity, Urine: 1.019 (ref 1.001–1.03)
pH: 5 (ref 5.0–8.0)

## 2020-05-06 LAB — CULTURE INDICATED

## 2020-05-07 ENCOUNTER — Encounter: Payer: Self-pay | Admitting: *Deleted

## 2020-05-07 ENCOUNTER — Encounter: Payer: Self-pay | Admitting: Obstetrics & Gynecology

## 2020-05-07 NOTE — Progress Notes (Signed)
    [name] 11-25-1970 [id number]        50 y.o.  G4P0013   RP: Postop XI Robotic TLH/Bilateral Salpingectomy on 04/14/2020  HPI: Good postop healing except for pain when laying on her side and when sitting.  No vaginal bleeding.  Pain with urination, irritation.  No fever.   OB History  Gravida Para Term Preterm AB Living  4 3     1 3   SAB IAB Ectopic Multiple Live Births               # Outcome Date GA Lbr Len/2nd Weight Sex Delivery Anes PTL Lv  4 AB           3 Para           2 Para           1 Para             Past medical history,surgical history, problem list, medications, allergies, family history and social history were all reviewed and documented in the EPIC chart.   Directed ROS with pertinent positives and negatives documented in the history of present illness/assessment and plan.  Exam:  Vitals:   05/04/20 1452  BP: 140/90   General appearance:  Normal  Abdomen: Incisions healing well.  Soft, NT, not distended.  Gynecologic exam: Vulva normal.  Vaginal vault closed.  No blood or discharge.  U/A:  Yellow clear, no nitrites, no proteins, RBC 3-10, WBC 6-10, Bacteria Moderate.  Pending Urine culture.   Assessment/Plan:  50 y.o. G4P0013   1. Status post gynecological surgery, follow-up exam Good postop healing without Cx, but still not comfortable in certain positions.  F/U 4 weeks to reassess.  2. Dysuria Urine mildly abnormal.  Decision to treat with Bactrim DS.  Pending U. Culture. - Urinalysis,Complete w/RFL Culture  Other orders - sulfamethoxazole-trimethoprim (BACTRIM DS) 800-160 MG tablet; Take 1 tablet by mouth 2 (two) times daily for 3 days. - Urine Culture - REFLEXIVE URINE CULTURE  Princess Bruins MD, 12:32 PM 05/07/2020

## 2020-05-10 ENCOUNTER — Inpatient Hospital Stay: Payer: PRIVATE HEALTH INSURANCE | Attending: Hematology and Oncology

## 2020-05-10 ENCOUNTER — Other Ambulatory Visit: Payer: Self-pay

## 2020-05-10 VITALS — BP 125/67 | HR 60 | Temp 98.6°F | Resp 18

## 2020-05-10 DIAGNOSIS — D500 Iron deficiency anemia secondary to blood loss (chronic): Secondary | ICD-10-CM | POA: Insufficient documentation

## 2020-05-10 MED ORDER — SODIUM CHLORIDE 0.9 % IV SOLN
Freq: Once | INTRAVENOUS | Status: AC
  Filled 2020-05-10: qty 250

## 2020-05-10 MED ORDER — SODIUM CHLORIDE 0.9 % IV SOLN
200.0000 mg | Freq: Once | INTRAVENOUS | Status: AC
  Administered 2020-05-10: 200 mg via INTRAVENOUS
  Filled 2020-05-10: qty 200

## 2020-05-10 NOTE — Patient Instructions (Signed)

## 2020-05-10 NOTE — Progress Notes (Signed)
Patient declined to stay for 30 minute post observation period. VSS upon leaving infusion room.  

## 2020-05-12 ENCOUNTER — Other Ambulatory Visit: Payer: Self-pay

## 2020-05-12 ENCOUNTER — Encounter: Payer: PRIVATE HEALTH INSURANCE | Attending: Psychology | Admitting: Psychology

## 2020-05-12 ENCOUNTER — Encounter: Payer: Self-pay | Admitting: Psychology

## 2020-05-12 DIAGNOSIS — F4310 Post-traumatic stress disorder, unspecified: Secondary | ICD-10-CM | POA: Diagnosis present

## 2020-05-12 DIAGNOSIS — G9050 Complex regional pain syndrome I, unspecified: Secondary | ICD-10-CM | POA: Insufficient documentation

## 2020-05-12 DIAGNOSIS — G44321 Chronic post-traumatic headache, intractable: Secondary | ICD-10-CM | POA: Diagnosis not present

## 2020-05-12 DIAGNOSIS — G43711 Chronic migraine without aura, intractable, with status migrainosus: Secondary | ICD-10-CM | POA: Insufficient documentation

## 2020-05-12 DIAGNOSIS — F0781 Postconcussional syndrome: Secondary | ICD-10-CM | POA: Insufficient documentation
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
## 2020-05-13 ENCOUNTER — Other Ambulatory Visit: Payer: PRIVATE HEALTH INSURANCE

## 2020-05-13 DIAGNOSIS — A048 Other specified bacterial intestinal infections: Secondary | ICD-10-CM

## 2020-05-15 LAB — H. PYLORI ANTIGEN, STOOL: H pylori Ag, Stl: NEGATIVE

## 2020-05-17 ENCOUNTER — Other Ambulatory Visit: Payer: Self-pay

## 2020-05-17 DIAGNOSIS — D509 Iron deficiency anemia, unspecified: Secondary | ICD-10-CM

## 2020-05-26 ENCOUNTER — Inpatient Hospital Stay (HOSPITAL_BASED_OUTPATIENT_CLINIC_OR_DEPARTMENT_OTHER): Payer: PRIVATE HEALTH INSURANCE | Admitting: Hematology and Oncology

## 2020-05-26 ENCOUNTER — Other Ambulatory Visit: Payer: Self-pay

## 2020-05-26 ENCOUNTER — Inpatient Hospital Stay: Payer: PRIVATE HEALTH INSURANCE

## 2020-05-26 VITALS — BP 127/80 | HR 82 | Temp 98.1°F | Resp 18 | Ht 66.0 in | Wt 161.7 lb

## 2020-05-26 DIAGNOSIS — D500 Iron deficiency anemia secondary to blood loss (chronic): Secondary | ICD-10-CM

## 2020-05-26 LAB — CBC WITH DIFFERENTIAL/PLATELET
Abs Immature Granulocytes: 0.01 10*3/uL (ref 0.00–0.07)
Basophils Absolute: 0.1 10*3/uL (ref 0.0–0.1)
Basophils Relative: 2 %
Eosinophils Absolute: 0.2 10*3/uL (ref 0.0–0.5)
Eosinophils Relative: 5 %
HCT: 41.2 % (ref 36.0–46.0)
Hemoglobin: 12.7 g/dL (ref 12.0–15.0)
Immature Granulocytes: 0 %
Lymphocytes Relative: 30 %
Lymphs Abs: 1.5 10*3/uL (ref 0.7–4.0)
MCH: 25.9 pg — ABNORMAL LOW (ref 26.0–34.0)
MCHC: 30.8 g/dL (ref 30.0–36.0)
MCV: 84.1 fL (ref 80.0–100.0)
Monocytes Absolute: 0.4 10*3/uL (ref 0.1–1.0)
Monocytes Relative: 8 %
Neutro Abs: 2.7 10*3/uL (ref 1.7–7.7)
Neutrophils Relative %: 55 %
Platelets: 330 10*3/uL (ref 150–400)
RBC: 4.9 MIL/uL (ref 3.87–5.11)
RDW: 19.1 % — ABNORMAL HIGH (ref 11.5–15.5)
WBC: 4.8 10*3/uL (ref 4.0–10.5)
nRBC: 0 % (ref 0.0–0.2)

## 2020-05-26 LAB — COMPREHENSIVE METABOLIC PANEL
ALT: 11 U/L (ref 0–44)
AST: 14 U/L — ABNORMAL LOW (ref 15–41)
Albumin: 4.1 g/dL (ref 3.5–5.0)
Alkaline Phosphatase: 53 U/L (ref 38–126)
Anion gap: 15 (ref 5–15)
BUN: 11 mg/dL (ref 6–20)
CO2: 21 mmol/L — ABNORMAL LOW (ref 22–32)
Calcium: 9.4 mg/dL (ref 8.9–10.3)
Chloride: 103 mmol/L (ref 98–111)
Creatinine, Ser: 0.73 mg/dL (ref 0.44–1.00)
GFR, Estimated: >60 mL/min (ref >60)
Glucose, Bld: 65 mg/dL — ABNORMAL LOW (ref 70–99)
Potassium: 4.1 mmol/L (ref 3.5–5.1)
Sodium: 139 mmol/L (ref 135–145)
Total Bilirubin: 0.3 mg/dL (ref 0.3–1.2)
Total Protein: 7.2 g/dL (ref 6.5–8.1)

## 2020-05-26 LAB — SAMPLE TO BLOOD BANK

## 2020-05-26 LAB — VITAMIN B12: Vitamin B-12: 245 pg/mL (ref 180–914)

## 2020-05-26 NOTE — Progress Notes (Signed)
[place] NOTE  Patient Care Team: Laurey Morale, MD as PCP - General Skeet Latch, MD as Consulting Physician (Cardiology)  CHIEF COMPLAINTS/PURPOSE OF CONSULTATION:  IDA  ASSESSMENT & PLAN:   Iron deficiency anemia due to chronic blood loss She is s.p 4 iron infusions, couldn't do the planned 6. She had her hysterectomy in Feb and doing well. She does feel better, eating less ice. No recent CBC or iron panel No concerning ROS or PE findings. I will order labs today, CBC , iron panel and ferritin, B12 levels. If labs consistent with ongoing iron deficiency, we will arrange for IV infusion.   Orders Placed This Encounter  Procedures  . CBC with Differential/Platelet    Standing Status:   Standing    Number of Occurrences:   22    Standing Expiration Date:   05/26/2021  . Iron and TIBC    Standing Status:   Future    Standing Expiration Date:   05/26/2021  . Ferritin    Standing Status:   Future    Standing Expiration Date:   05/26/2021  . Vitamin B12    Standing Status:   Future    Standing Expiration Date:   05/26/2021     HISTORY OF PRESENTING ILLNESS:  [name] 50 y.o. female is here because of severe anemia disproportionate to the menstrual blood loss.  This is a very pleasant 50 year old female patient, excellent historian referred to hematology for recommendations regarding persistent and severe iron deficiency anemia.   Interim History  She had Robotic Total Laparoscopic Hysterectomy with Bilateral Salpingectomy on 04/15/2020 Last iron infusion on 05/10/2020 She only could get 4 iron infusions because of insurance reasons. She feels well overall, everything healed well. She doesn't crunch on ice much anymore. No change in breathing.  REVIEW OF SYSTEMS:   Constitutional: Denies fevers, chills or abnormal night sweats.  Rest as mentioned in HPI Eyes: Denies blurriness of vision, double vision or watery eyes Ears, nose,  mouth, throat, and face: Denies mucositis or sore throat Respiratory: Denies cough, dyspnea or wheezes Cardiovascular: Complains of palpitations, vague chest pain on the left side Gastrointestinal:  Denies nausea, heartburn or change in bowel habits Skin: Denies abnormal skin rashes Lymphatics: Denies new lymphadenopathy or easy bruising Neurological:Denies numbness, tingling or new weaknesses Behavioral/Psych: Mood is stable, no new changes  All other systems were reviewed with the patient and are negative.  MEDICAL HISTORY:  Past Medical History:  Diagnosis Date  . Anemia    iron deficiency   . Blood transfusion without reported diagnosis   . Cardiac arrhythmia   . Cellulitis   . Chronic headaches   . Chronic lumbar pain since 2013   sees Dr. Arvilla Market   . Clotting disorder ([place])    PE prior to pregnancy.  . Complication of anesthesia    Bp drops and alot of shaking after anesthesia  . COVID-19 03/01/2020  . Interstitial cystitis   . Palpitations 02/19/2018  . PE (pulmonary embolism)    due to surgery  . Pneumonia   . PONV (postoperative nausea and vomiting)   . Sepsis ([place])    due to surgery  . Staph infection     SURGICAL HISTORY: Past Surgical History:  Procedure Laterality Date  . BREAST REDUCTION SURGERY    . COLONOSCOPY  06/25/2019   per Dr. Havery Moros, adeomatous polyps, repeat in 3 yrs   . DILATION AND CURETTAGE OF UTERUS    .  EXPLORATORY LAPAROTOMY     with left ovary and mass removed  . RADIOFREQUENCY ABLATION NERVES     lumbar spine, per Dr. Ace Gins   . ROBOTIC ASSISTED LAPAROSCOPIC HYSTERECTOMY AND SALPINGECTOMY Bilateral 04/14/2020   Procedure: XI ROBOTIC ASSISTED TOTAL LAPAROSCOPIC HYSTERECTOMY AND SALPINGECTOMY;  Surgeon: Princess Bruins, MD;  Location: [facility];  Service: Gynecology;  Laterality: Bilateral;  . TUBAL LIGATION    . UPPER GASTROINTESTINAL ENDOSCOPY      SOCIAL HISTORY: Social History   Socioeconomic History  . Marital status:  Married    Spouse name: Not on file  . Number of children: 3  . Years of education: Not on file  . Highest education level: Not on file  Occupational History  . Occupation: Glass blower/designer  Tobacco Use  . Smoking status: Former Smoker    Years: 1.00    Types: Cigarettes  . Smokeless tobacco: Never Used  . Tobacco comment: age 76  Vaping Use  . Vaping Use: Never used  Substance and Sexual Activity  . Alcohol use: Yes    Alcohol/week: 0.0 standard drinks    Comment: glass of wine occasionally  . Drug use: No  . Sexual activity: Yes    Partners: Male    Birth control/protection: None    Comment: Tubal ligation  Other Topics Concern  . Not on file  Social History Narrative   Lives at home with husband and 2 children   Right handed   Caffeine: 1-2 cups of coffee per day   Social Determinants of Health   Financial Resource Strain: Not on file  Food Insecurity: Not on file  Transportation Needs: Not on file  Physical Activity: Not on file  Stress: Not on file  Social Connections: Not on file  Intimate Partner Violence: Not on file    FAMILY HISTORY: Family History  Problem Relation Age of Onset  . Colon polyps Mother   . Irritable bowel syndrome Mother   . Heart defect Mother        tachycardia  . Colon polyps Paternal Grandmother   . Hypertension Paternal Grandmother   . Heart defect Paternal Grandmother   . Heart attack Paternal Grandmother   . Colon polyps Maternal Grandmother   . Hypertension Maternal Grandmother   . Diabetes Paternal Grandfather   . Heart disease Paternal Grandfather   . Hypertension Paternal Grandfather   . Heart attack Paternal Grandfather   . Diabetes Maternal Grandfather   . Heart disease Maternal Grandfather   . Hypertension Maternal Grandfather   . Diabetes Sister   . Hypertension Sister   . Heart Problems Sister   . Irritable bowel syndrome Paternal Aunt   . Cancer Paternal Aunt        melanoma  . Liver disease Father   .  Hypertension Father   . COPD Father   . Colon cancer Neg Hx   . Esophageal cancer Neg Hx   . Rectal cancer Neg Hx   . Stomach cancer Neg Hx     ALLERGIES:  has No Known Allergies.  MEDICATIONS:  Current Outpatient Medications  Medication Sig Dispense Refill  . cholecalciferol (VITAMIN D3) 25 MCG (1000 UNIT) tablet Take 1,000 Units by mouth daily.    . cyclobenzaprine (FLEXERIL) 10 MG tablet Take 1 tablet (10 mg total) by mouth 3 (three) times daily as needed for muscle spasms. 60 tablet 2  . gabapentin (NEURONTIN) 300 MG capsule Take 300 mg by mouth 3 (three) times daily as needed (pain).     Marland Kitchen  ibuprofen (ADVIL) 800 MG tablet Take 1 tablet (800 mg total) by mouth every 8 (eight) hours as needed. 30 tablet 0  . ondansetron (ZOFRAN) 4 MG tablet Take 1 tablet (4 mg total) by mouth every 8 (eight) hours as needed for nausea or vomiting. 9 tablet 0  . traZODone (DESYREL) 50 MG tablet Take 1-2 tablets (50-100 mg total) by mouth at bedtime. (Patient taking differently: Take 50-100 mg by mouth at bedtime as needed for sleep.) 60 tablet 3  . vitamin B-12 (CYANOCOBALAMIN) 1000 MCG tablet Take 1,000 mcg by mouth daily.    Marland Kitchen zolpidem (AMBIEN) 10 MG tablet Take 1 tablet (10 mg total) by mouth at bedtime as needed for sleep. 30 tablet 0   No current facility-administered medications for this visit.    PHYSICAL EXAMINATION:  ECOG PERFORMANCE STATUS: 2 - Symptomatic, <50% confined to bed  Vitals:   05/26/20 1504  BP: 127/80  Pulse: 82  Resp: 18  Temp: 98.1 F (36.7 C)  SpO2: 100%   Filed Weights   05/26/20 1504  Weight: 161 lb 11.2 oz (73.3 kg)    GENERAL:alert, no distress and comfortable SKIN: skin color, texture, turgor are normal, no rashes or significant lesions EYES: normal, conjunctiva are pale, sclera clear.  No icterus OROPHARYNX:no exudate, no erythema and lips, buccal mucosa, and tongue normal  NECK: supple, thyroid normal size, non-tender, without nodularity LYMPH:  no  palpable lymphadenopathy in the cervical, axillary or inguinal LUNGS: clear to auscultation and percussion with normal breathing effort HEART: regular rate & rhythm  ABDOMEN:abdomen soft, non-tender and normal bowel sounds Musculoskeletal:no cyanosis of digits and no clubbing.  No bilateral lower extremity edema PSYCH: alert & oriented x 3 with fluent speech NEURO: no focal motor/sensory deficits  LABORATORY DATA:  I have reviewed the data as listed Lab Results  Component Value Date   WBC 9.7 04/14/2020   HGB 10.1 (L) 04/14/2020   HCT 33.3 (L) 04/14/2020   MCV 82.4 04/14/2020   PLT 372 04/14/2020     Chemistry      Component Value Date/Time   NA 141 04/14/2020 0739   K 3.6 04/14/2020 0739   CL 105 04/14/2020 0739   CO2 22 04/07/2020 1036   BUN 15 04/14/2020 0739   CREATININE 0.60 04/14/2020 1449   CREATININE 0.64 09/17/2019 1554      Component Value Date/Time   CALCIUM 9.1 04/07/2020 1036   ALKPHOS 47 02/06/2020 1221   AST 11 (L) 02/06/2020 1221   ALT 8 02/06/2020 1221   BILITOT 0.2 (L) 02/06/2020 1221      RADIOGRAPHIC STUDIES: I have personally reviewed the radiological images as listed and agreed with the findings in the report. No results found. I have reviewed all labs  Back in 2019, she was found to have severe iron deficiency, borderline B12 levels, normal folate levels. Labs from today pending  Time  I spent a total of 20 minutes in the care of this patient including history and physical, review of medical records, counseling and coordination of care for    Benay Pike, MD 05/26/2020 3:28 PM

## 2020-05-26 NOTE — Assessment & Plan Note (Signed)
She is s.p 4 iron infusions, couldn't do the planned 6. She had her hysterectomy in Feb and doing well. She does feel better, eating less ice. No recent CBC or iron panel No concerning ROS or PE findings. I will order labs today, CBC , iron panel and ferritin, B12 levels. If labs consistent with ongoing iron deficiency, we will arrange for IV infusion.

## 2020-05-27 LAB — IRON AND TIBC
Iron: 42 ug/dL (ref 41–142)
Saturation Ratios: 13 % — ABNORMAL LOW (ref 21–57)
TIBC: 328 ug/dL (ref 236–444)
UIBC: 286 ug/dL (ref 120–384)

## 2020-05-27 LAB — FERRITIN: Ferritin: 39 ng/mL (ref 11–307)

## 2020-06-02 ENCOUNTER — Other Ambulatory Visit: Payer: Self-pay | Admitting: Obstetrics & Gynecology

## 2020-06-04 ENCOUNTER — Encounter: Payer: Self-pay | Admitting: Obstetrics & Gynecology

## 2020-06-04 ENCOUNTER — Encounter: Payer: Self-pay | Admitting: Hematology and Oncology

## 2020-06-04 ENCOUNTER — Ambulatory Visit (INDEPENDENT_AMBULATORY_CARE_PROVIDER_SITE_OTHER): Payer: PRIVATE HEALTH INSURANCE | Admitting: Obstetrics & Gynecology

## 2020-06-04 ENCOUNTER — Other Ambulatory Visit: Payer: Self-pay

## 2020-06-04 VITALS — BP 126/74

## 2020-06-04 DIAGNOSIS — Z09 Encounter for follow-up examination after completed treatment for conditions other than malignant neoplasm: Secondary | ICD-10-CM

## 2020-06-04 NOTE — Progress Notes (Signed)
    [name] Dec 13, 1970 [id number]        50 y.o.  [id number]   RP: Postop XI Robotic TLH/Bilateral Salpingectomy 04/14/2020  HPI: Very good postop healing.  Mild tenderness and small knot felt at supraumbilical incision, skin normal.  No abdominopelvic pain.  No vaginal bleeding.  No vaginal discharge.  Seen by Hemato.  Much improved Hb at 12.7 on 05/26/2020.  H/O Interstitial Cystitis, no worsening of Sxs.  BMs normal.  No fever.   OB History  Gravida Para Term Preterm AB Living  4 3     1 3   SAB IAB Ectopic Multiple Live Births               # Outcome Date GA Lbr Len/2nd Weight Sex Delivery Anes PTL Lv  4 AB           3 Para           2 Para           1 Para             Past medical history,surgical history, problem list, medications, allergies, family history and social history were all reviewed and documented in the EPIC chart.   Directed ROS with pertinent positives and negatives documented in the history of present illness/assessment and plan.  Exam:  Vitals:   06/04/20 1030  BP: 126/74   General appearance:  Normal  Abdomen:  Soft, not distended.  Incisions well closed, no erythema.  Supraumbilical incision with mild induration, no sign of infection, no hernia felt.  Gynecologic exam: Vulva normal.  Bimanual exam:  Vaginal vault well closed.  No mass at the vaginal vault or in the pelvis.  Mildly tender anteriorly.  No blood, no discharge.   Assessment/Plan:  50 y.o. G4P0013   1. Status post gynecological surgery, follow-up exam Excellent postop evolution with no complication.  Resolved chronic anemia with hemoglobin at 12.7 on May 26, 2020.  We will follow-up for annual gynecologic exam and will do the fasting health labs here at that time.  Princess Bruins MD, 10:47 AM 06/04/2020

## 2020-07-05 ENCOUNTER — Other Ambulatory Visit: Payer: Self-pay | Admitting: Obstetrics & Gynecology

## 2020-07-06 NOTE — Telephone Encounter (Signed)
Medication refill request: ambien 10mg  Last AEX:  07-03-2019 Next AEX: 08-17-20 Last MMG (if hormonal medication request): n/a Refill authorized: please approve if appropriate

## 2020-07-21 ENCOUNTER — Telehealth: Payer: Self-pay | Admitting: Hematology and Oncology

## 2020-07-21 NOTE — Telephone Encounter (Signed)
R/s per prov request, pt aware

## 2020-07-27 ENCOUNTER — Encounter: Payer: Self-pay | Admitting: Cardiovascular Disease

## 2020-08-04 ENCOUNTER — Ambulatory Visit: Payer: PRIVATE HEALTH INSURANCE | Admitting: Cardiovascular Disease

## 2020-08-11 ENCOUNTER — Other Ambulatory Visit: Payer: Self-pay | Admitting: Obstetrics & Gynecology

## 2020-08-12 NOTE — Telephone Encounter (Signed)
Annual exam scheduled on 08/17/20

## 2020-08-17 ENCOUNTER — Encounter: Payer: Self-pay | Admitting: Obstetrics & Gynecology

## 2020-08-17 ENCOUNTER — Other Ambulatory Visit: Payer: Self-pay

## 2020-08-17 ENCOUNTER — Telehealth: Payer: Self-pay | Admitting: *Deleted

## 2020-08-17 ENCOUNTER — Ambulatory Visit (INDEPENDENT_AMBULATORY_CARE_PROVIDER_SITE_OTHER): Payer: PRIVATE HEALTH INSURANCE | Admitting: Obstetrics & Gynecology

## 2020-08-17 VITALS — BP 110/78 | Ht 64.5 in | Wt 155.0 lb

## 2020-08-17 DIAGNOSIS — N63 Unspecified lump in unspecified breast: Secondary | ICD-10-CM

## 2020-08-17 DIAGNOSIS — Z8262 Family history of osteoporosis: Secondary | ICD-10-CM

## 2020-08-17 DIAGNOSIS — Z90710 Acquired absence of both cervix and uterus: Secondary | ICD-10-CM

## 2020-08-17 DIAGNOSIS — Z01419 Encounter for gynecological examination (general) (routine) without abnormal findings: Secondary | ICD-10-CM | POA: Diagnosis not present

## 2020-08-17 NOTE — Progress Notes (Signed)
[name] [birth year] [id number]   History:    50 y.o. [id number] Married  RP:  Established patient presenting for annual gyn exam   HPI: S/P XI Robotic TLH/Bilateral Salpingectomy on 04/14/2020.  No abdominopelvic pain.  No pain with IC.  Breasts lumpy bilaterally.  Colono 06/2019.  BMI 26.19.  Health labs with Fam MD.  2 GMs with Ostoporosis.  Patient loosing height, about 1 inch.  Past medical history,surgical history, family history and social history were all reviewed and documented in the EPIC chart.  Gynecologic History Patient's last menstrual period was 03/21/2020.  Obstetric History OB History  Gravida Para Term Preterm AB Living  4 3     1 3   SAB IAB Ectopic Multiple Live Births               # Outcome Date GA Lbr Len/2nd Weight Sex Delivery Anes PTL Lv  4 AB           3 Para           2 Para           1 Para              ROS: A ROS was performed and pertinent positives and negatives are included in the history.  GENERAL: No fevers or chills. HEENT: No change in vision, no earache, sore throat or sinus congestion. NECK: No pain or stiffness. CARDIOVASCULAR: No chest pain or pressure. No palpitations. PULMONARY: No shortness of breath, cough or wheeze. GASTROINTESTINAL: No abdominal pain, nausea, vomiting or diarrhea, melena or bright red blood per rectum. GENITOURINARY: No urinary frequency, urgency, hesitancy or dysuria. MUSCULOSKELETAL: No joint or muscle pain, no back pain, no recent trauma. DERMATOLOGIC: No rash, no itching, no lesions. ENDOCRINE: No polyuria, polydipsia, no heat or cold intolerance. No recent change in weight. HEMATOLOGICAL: No anemia or easy bruising or bleeding. NEUROLOGIC: No headache, seizures, numbness, tingling or weakness. PSYCHIATRIC: No depression, no loss of interest in normal activity or change in sleep pattern.     Exam:   BP 110/78   Ht 5' 4.5" (1.638 m)   Wt 155 lb (70.3 kg)   LMP 03/21/2020   BMI 26.19 kg/m   Body mass  index is 26.19 kg/m.  General appearance : Well developed well nourished female. No acute distress HEENT: Eyes: no retinal hemorrhage or exudates,  Neck supple, trachea midline, no carotid bruits, no thyroidmegaly Lungs: Clear to auscultation, no rhonchi or wheezes, or rib retractions  Heart: Regular rate and rhythm, no murmurs or gallops Breast:Examined in sitting and supine position were symmetrical in appearance, palpable masses about 3 cm at 8 O'Clock on Rt breast and at 3 O'Clock on Lt breast,  no skin retraction, no nipple inversion, no nipple discharge, no skin discoloration, no axillary or supraclavicular lymphadenopathy Abdomen: no palpable masses or tenderness, no rebound or guarding Extremities: no edema or skin discoloration or tenderness  Pelvic: Vulva: Normal             Vagina: No gross lesions or discharge  Cervix/Uterus absent  Adnexa  Without masses or tenderness  Anus: Normal   Assessment/Plan:  50 y.o. female for annual exam   1. Well female exam with routine gynecological exam Gynecologic exam status post total hysterectomy.  Pathology benign on cervix in February 2022, no indication to repeat a Pap test at this time.  Breast exam revealing densities at 8:00 on the right breast and 3:00 on the left breast.  We will schedule bilateral diagnostic mammograms.  Colonoscopy 2021.  Health labs with family physician.  Body mass index 26.19.  Recommend increasing fitness and continuing with healthy nutrition.  Insomnia probably associated with overworking and stress.  Patient takes Ambien.  2. S/P total hysterectomy  3. Lumps or masses in breasts Right breast density measuring about 3 cm at 8:00 and left breast density measuring about 3 cm at 3:00.  We will schedule bilateral diagnostic mammograms.  Bilateral Dx mammo.  51. Fam hx-osteoporosis Maternal and paternal grandmothers with osteoporosis.  Loss in height 1 inch.  We will schedule a bone density.  Recommend vitamin D  supplements, calcium intake of 1.5 g/day total and regular weightbearing physical activities. - DG Bone Density; Future   Princess Bruins MD, 10:46 AM 08/17/2020

## 2020-08-17 NOTE — Telephone Encounter (Signed)
-----   Message from Princess Bruins, MD sent at 08/17/2020 11:11 AM EDT ----- Regarding: Schedule bilateral Dx mammo Dense masses at 8 O'clock Rt breast and 3 O'clock Lt breast.  Bilateral Dx mammo.

## 2020-08-17 NOTE — Telephone Encounter (Signed)
Patient scheduled at the breast center on 09/29/20 @ 8:30 am.

## 2020-08-23 NOTE — Telephone Encounter (Signed)
Patient read message "Last read by Migdalia Dk at 2:29 PM on 08/18/2020."  Encounter closed.

## 2020-08-26 ENCOUNTER — Ambulatory Visit: Payer: PRIVATE HEALTH INSURANCE | Admitting: Psychology

## 2020-08-31 ENCOUNTER — Ambulatory Visit: Payer: PRIVATE HEALTH INSURANCE | Admitting: Hematology and Oncology

## 2020-09-07 ENCOUNTER — Other Ambulatory Visit: Payer: Self-pay

## 2020-09-07 ENCOUNTER — Inpatient Hospital Stay: Payer: PRIVATE HEALTH INSURANCE | Attending: Hematology and Oncology | Admitting: Hematology and Oncology

## 2020-09-07 ENCOUNTER — Inpatient Hospital Stay: Payer: PRIVATE HEALTH INSURANCE

## 2020-09-07 ENCOUNTER — Encounter: Payer: Self-pay | Admitting: Hematology and Oncology

## 2020-09-07 ENCOUNTER — Ambulatory Visit (INDEPENDENT_AMBULATORY_CARE_PROVIDER_SITE_OTHER): Payer: PRIVATE HEALTH INSURANCE

## 2020-09-07 ENCOUNTER — Other Ambulatory Visit: Payer: Self-pay | Admitting: Obstetrics & Gynecology

## 2020-09-07 VITALS — BP 127/76 | HR 58 | Temp 97.6°F | Resp 17 | Ht 64.5 in | Wt 157.3 lb

## 2020-09-07 DIAGNOSIS — Z780 Asymptomatic menopausal state: Secondary | ICD-10-CM

## 2020-09-07 DIAGNOSIS — Z8262 Family history of osteoporosis: Secondary | ICD-10-CM

## 2020-09-07 DIAGNOSIS — Z8616 Personal history of COVID-19: Secondary | ICD-10-CM | POA: Insufficient documentation

## 2020-09-07 DIAGNOSIS — I495 Sick sinus syndrome: Secondary | ICD-10-CM | POA: Diagnosis not present

## 2020-09-07 DIAGNOSIS — D500 Iron deficiency anemia secondary to blood loss (chronic): Secondary | ICD-10-CM | POA: Diagnosis present

## 2020-09-07 DIAGNOSIS — R5383 Other fatigue: Secondary | ICD-10-CM | POA: Diagnosis not present

## 2020-09-07 DIAGNOSIS — Z13820 Encounter for screening for osteoporosis: Secondary | ICD-10-CM

## 2020-09-07 DIAGNOSIS — G8929 Other chronic pain: Secondary | ICD-10-CM | POA: Insufficient documentation

## 2020-09-07 DIAGNOSIS — N920 Excessive and frequent menstruation with regular cycle: Secondary | ICD-10-CM | POA: Diagnosis present

## 2020-09-07 DIAGNOSIS — D689 Coagulation defect, unspecified: Secondary | ICD-10-CM | POA: Diagnosis not present

## 2020-09-07 DIAGNOSIS — Z87891 Personal history of nicotine dependence: Secondary | ICD-10-CM | POA: Insufficient documentation

## 2020-09-07 DIAGNOSIS — R29890 Loss of height: Secondary | ICD-10-CM

## 2020-09-07 DIAGNOSIS — D519 Vitamin B12 deficiency anemia, unspecified: Secondary | ICD-10-CM | POA: Diagnosis not present

## 2020-09-07 DIAGNOSIS — M545 Low back pain, unspecified: Secondary | ICD-10-CM | POA: Diagnosis not present

## 2020-09-07 DIAGNOSIS — I499 Cardiac arrhythmia, unspecified: Secondary | ICD-10-CM | POA: Insufficient documentation

## 2020-09-07 DIAGNOSIS — Z86711 Personal history of pulmonary embolism: Secondary | ICD-10-CM | POA: Insufficient documentation

## 2020-09-07 LAB — SAMPLE TO BLOOD BANK

## 2020-09-07 LAB — CBC WITH DIFFERENTIAL/PLATELET
Abs Immature Granulocytes: 0.01 10*3/uL (ref 0.00–0.07)
Basophils Absolute: 0.1 10*3/uL (ref 0.0–0.1)
Basophils Relative: 1 %
Eosinophils Absolute: 0.1 10*3/uL (ref 0.0–0.5)
Eosinophils Relative: 1 %
HCT: 40.9 % (ref 36.0–46.0)
Hemoglobin: 13.6 g/dL (ref 12.0–15.0)
Immature Granulocytes: 0 %
Lymphocytes Relative: 29 %
Lymphs Abs: 1.6 10*3/uL (ref 0.7–4.0)
MCH: 30 pg (ref 26.0–34.0)
MCHC: 33.3 g/dL (ref 30.0–36.0)
MCV: 90.3 fL (ref 80.0–100.0)
Monocytes Absolute: 0.4 10*3/uL (ref 0.1–1.0)
Monocytes Relative: 8 %
Neutro Abs: 3.3 10*3/uL (ref 1.7–7.7)
Neutrophils Relative %: 61 %
Platelets: 265 10*3/uL (ref 150–400)
RBC: 4.53 MIL/uL (ref 3.87–5.11)
RDW: 14.9 % (ref 11.5–15.5)
WBC: 5.5 10*3/uL (ref 4.0–10.5)
nRBC: 0 % (ref 0.0–0.2)

## 2020-09-07 LAB — VITAMIN B12: Vitamin B-12: 176 pg/mL — ABNORMAL LOW (ref 180–914)

## 2020-09-07 LAB — VITAMIN D 25 HYDROXY (VIT D DEFICIENCY, FRACTURES): Vit D, 25-Hydroxy: 20.96 ng/mL — ABNORMAL LOW (ref 30–100)

## 2020-09-07 NOTE — Pre-Procedure Instructions (Signed)
Surgical Instructions    Your procedure is scheduled on Tuesday July 12th.  Report to [facility] Main Entrance "A" at 10:30 A.M., then check in with the Admitting office.  Call this number if you have problems the morning of surgery:  [phone]   If you have any questions prior to your surgery date call [phone]: Open Monday-Friday 8am-4pm    Remember:  Do not eat after midnight the night before your surgery  You may drink clear liquids until 09:30 A.M. the morning of your surgery.   Clear liquids allowed are: Water, Non-Citrus Juices (without pulp), Carbonated Beverages, Clear Tea, Black Coffee Only, and Gatorade Patient Instructions  The night before surgery:  No food after midnight. ONLY clear liquids after midnight  The day of surgery (if you do NOT have diabetes):  Drink ONE (1) Pre-Surgery Clear Ensure by 09:30 A.M. the morning of surgery. Drink in one sitting. Do not sip.  This drink was given to you during your hospital  pre-op appointment visit.  Nothing else to drink after completing the  Pre-Surgery Clear Ensure.         If you have questions, please contact your surgeon's office.     Take these medicines the morning of surgery with A SIP OF WATER   None   As of today, STOP taking any Aspirin (unless otherwise instructed by your surgeon) Aleve, Naproxen, Ibuprofen, Motrin, Advil, Goody's, BC's, all herbal medications, fish oil, and all vitamins.                     Do NOT Smoke (Tobacco/Vaping) or drink Alcohol 24 hours prior to your procedure.  If you use a CPAP at night, you may bring all equipment for your overnight stay.   Contacts, glasses, piercing's, hearing aid's, dentures or partials may not be worn into surgery, please bring cases for these belongings.    For patients admitted to the hospital, discharge time will be determined by your treatment team.   Patients discharged the day of surgery will not be allowed to drive home, and someone needs  to stay with them for 24 hours.  ONLY 1 SUPPORT PERSON MAY BE PRESENT WHILE YOU ARE IN SURGERY. IF YOU ARE TO BE ADMITTED ONCE YOU ARE IN YOUR ROOM YOU WILL BE ALLOWED TWO (2) VISITORS.  Minor children may have two parents present. Special consideration for safety and communication needs will be reviewed on a case by case basis.   Special instructions:   [facility]- Preparing For Surgery  Before surgery, you can play an important role. Because skin is not sterile, your skin needs to be as free of germs as possible. You can reduce the number of germs on your skin by washing with CHG (chlorahexidine gluconate) Soap before surgery.  CHG is an antiseptic cleaner which kills germs and bonds with the skin to continue killing germs even after washing.    Oral Hygiene is also important to reduce your risk of infection.  Remember - BRUSH YOUR TEETH THE MORNING OF SURGERY WITH YOUR REGULAR TOOTHPASTE  Please do not use if you have an allergy to CHG or antibacterial soaps. If your skin becomes reddened/irritated stop using the CHG.  Do not shave (including legs and underarms) for at least 48 hours prior to first CHG shower. It is OK to shave your face.  Please follow these instructions carefully.   Shower the NIGHT BEFORE SURGERY and the MORNING OF SURGERY  If you chose to  wash your hair, wash your hair first as usual with your normal shampoo.  After you shampoo, rinse your hair and body thoroughly to remove the shampoo.  Use CHG Soap as you would any other liquid soap. You can apply CHG directly to the skin and wash gently with a scrungie or a clean washcloth.   Apply the CHG Soap to your body ONLY FROM THE NECK DOWN.  Do not use on open wounds or open sores. Avoid contact with your eyes, ears, mouth and genitals (private parts). Wash Face and genitals (private parts)  with your normal soap.   Wash thoroughly, paying special attention to the area where your surgery will be performed.  Thoroughly  rinse your body with warm water from the neck down.  DO NOT shower/wash with your normal soap after using and rinsing off the CHG Soap.  Pat yourself dry with a CLEAN TOWEL.  Wear CLEAN PAJAMAS to bed the night before surgery  Place CLEAN SHEETS on your bed the night before your surgery  DO NOT SLEEP WITH PETS.   Day of Surgery: Shower with CHG soap. Do not wear jewelry, make up, nail polish, gel polish, artificial nails, or any other type of covering on natural nails including finger and toenails. If patients have artificial nails, gel coating, etc. that need to be removed by a nail salon please have this removed prior to surgery. Surgery may need to be canceled/delayed if the surgeon/ anesthesia feels like the patient is unable to be adequately monitored. Do not wear lotions, powders, perfumes/colognes, or deodorant. Do not shave 48 hours prior to surgery.  Men may shave face and neck. Do not bring valuables to the hospital. [facility] is not responsible for any belongings or valuables. Wear Clean/Comfortable clothing the morning of surgery Remember to brush your teeth WITH YOUR REGULAR TOOTHPASTE.   Please read over the following fact sheets that you were given.

## 2020-09-07 NOTE — Progress Notes (Signed)
[name] NOTE  Patient Care Team: Laurey Morale, MD as PCP - Jordan Hawks, MD as Consulting Physician (Cardiology)  CHIEF COMPLAINTS/PURPOSE OF CONSULTATION:  IDA  ASSESSMENT & PLAN:   This is a very pleasant 50 year old female patient with severe anemia likely secondary to menstrual blood loss, had hysterectomy and required intravenous iron infusions, last iron infusion in March 2022 returns for follow-up.  Review of systems, much improved fatigue, resolved pica.  She continues to have occasional episodes of dizziness, likely related to her heart rate issues.  She follows up with cardiology. Physical examination, healthy appearing female patient, no major concerns.   We will repeat labs today CBC, iron panel, ferritin, B12 and vitamin D given her history of vitamin D and B12 deficiency as well. If she continues to have well-controlled iron deficiency, she can return to clinic in 6 months. Age-appropriate cancer screening advised, she is getting a diagnostic mammogram soon.  HISTORY OF PRESENTING ILLNESS:   [name] 50 y.o. female is here because of severe anemia disproportionate to the menstrual blood loss.  This is a very pleasant 50 year old female patient, excellent historian referred to hematology for recommendations regarding persistent and severe iron deficiency anemia.   Interim History  Since last visit, she feels remarkably well. Fatigue is much more tolerable, she is able to do much more. NO more ice craving She still has episodes of tachy/brady and she follows up with cardiology. Rest of the pertinent ros reviewed and neg  REVIEW OF SYSTEMS:   Constitutional: Denies fevers, chills or abnormal night sweats.  Rest as mentioned in HPI Eyes: Denies blurriness of vision, double vision or watery eyes Ears, nose, mouth, throat, and face: Denies mucositis or sore throat Respiratory: Denies cough, dyspnea or  wheezes Cardiovascular: Complains of palpitations, vague chest pain on the left side Gastrointestinal:  Denies nausea, heartburn or change in bowel habits Skin: Denies abnormal skin rashes Lymphatics: Denies new lymphadenopathy or easy bruising Neurological:Denies numbness, tingling or new weaknesses Behavioral/Psych: Mood is stable, no new changes  All other systems were reviewed with the patient and are negative.  MEDICAL HISTORY:  Past Medical History:  Diagnosis Date   Anemia    iron deficiency    Blood transfusion without reported diagnosis    Cardiac arrhythmia    Cellulitis    Chronic headaches    Chronic lumbar pain since 2013   sees Dr. Arvilla Market    Clotting disorder [facility])    PE prior to pregnancy.   Complication of anesthesia    Bp drops and alot of shaking after anesthesia   COVID-19 03/01/2020   Interstitial cystitis    Palpitations 02/19/2018   PE (pulmonary embolism)    due to surgery   Pneumonia    PONV (postoperative nausea and vomiting)    Sepsis ([place])    due to surgery   Staph infection     SURGICAL HISTORY: Past Surgical History:  Procedure Laterality Date   BREAST REDUCTION SURGERY     COLONOSCOPY  06/25/2019   per Dr. Havery Moros, adeomatous polyps, repeat in 3 yrs    [place]     with left ovary and mass removed   RADIOFREQUENCY ABLATION NERVES     lumbar spine, per Dr. Ace Gins    ROBOTIC ASSISTED LAPAROSCOPIC HYSTERECTOMY AND SALPINGECTOMY Bilateral 04/14/2020   Procedure: XI ROBOTIC ASSISTED TOTAL LAPAROSCOPIC HYSTERECTOMY AND SALPINGECTOMY;  Surgeon: Princess Bruins, MD;  Location: [facility] OR;  Service: Gynecology;  Laterality: Bilateral;   TUBAL LIGATION     UPPER GASTROINTESTINAL ENDOSCOPY      SOCIAL HISTORY: Social History   Socioeconomic History   Marital status: Married    Spouse name: Not on file   Number of children: 3   Years of education: Not on file   Highest education  level: Not on file  Occupational History   Occupation: Glass blower/designer  Tobacco Use   Smoking status: Former    Years: 1.00    Pack years: 0.00    Types: Cigarettes   Smokeless tobacco: Never   Tobacco comments:    age 43  Vaping Use   Vaping Use: Never used  Substance and Sexual Activity   Alcohol use: Yes    Alcohol/week: 0.0 standard drinks    Comment: glass of wine occasionally   Drug use: No   Sexual activity: Yes    Partners: Male    Birth control/protection: None    Comment: Tubal ligation  Other Topics Concern   Not on file  Social History Narrative   Lives at home with husband and 2 children   Right handed   Caffeine: 1-2 cups of coffee per day   Social Determinants of Health   Financial Resource Strain: Not on file  Food Insecurity: Not on file  Transportation Needs: Not on file  Physical Activity: Not on file  Stress: Not on file  Social Connections: Not on file  Intimate Partner Violence: Not on file    FAMILY HISTORY: Family History  Problem Relation Age of Onset   Colon polyps Mother    Irritable bowel syndrome Mother    Heart defect Mother        tachycardia   Colon polyps Paternal Grandmother    Hypertension Paternal Grandmother    Heart defect Paternal Grandmother    Heart attack Paternal Grandmother    Colon polyps Maternal Grandmother    Hypertension Maternal Grandmother    Diabetes Paternal Grandfather    Heart disease Paternal Grandfather    Hypertension Paternal Grandfather    Heart attack Paternal Grandfather    Diabetes Maternal Grandfather    Heart disease Maternal Grandfather    Hypertension Maternal Grandfather    Diabetes Sister    Hypertension Sister    Heart Problems Sister    Irritable bowel syndrome Paternal Aunt    Cancer Paternal Aunt        melanoma   Liver disease Father    Hypertension Father    COPD Father    Colon cancer Neg Hx    Esophageal cancer Neg Hx    Rectal cancer Neg Hx    Stomach cancer Neg Hx      ALLERGIES:  has No Known Allergies.  MEDICATIONS:  Current Outpatient Medications  Medication Sig Dispense Refill   cholecalciferol (VITAMIN D3) 25 MCG (1000 UNIT) tablet Take 1,000 Units by mouth daily.     ibuprofen (ADVIL) 800 MG tablet Take 1 tablet (800 mg total) by mouth every 8 (eight) hours as needed. 30 tablet 0   vitamin B-12 (CYANOCOBALAMIN) 1000 MCG tablet Take 1,000 mcg by mouth daily.     zolpidem (AMBIEN) 10 MG tablet TAKE 1 TABLET BY MOUTH AT BEDTIME AS NEEDED FOR SLEEP 30 tablet 0   No current facility-administered medications for this visit.    PHYSICAL EXAMINATION:  ECOG PERFORMANCE STATUS: 2 - Symptomatic, <50% confined to bed  Vitals:   09/07/20 1425  BP:  127/76  Pulse: (!) 58  Resp: 17  Temp: 97.6 F (36.4 C)  SpO2: 99%   Filed Weights   09/07/20 1425  Weight: 157 lb 4.8 oz (71.4 kg)   GENERAL:alert, no distress and comfortable SKIN: skin color, texture, turgor are normal, no rashes or significant lesions EYES: normal, conjunctiva are pale, sclera clear.  No icterus OROPHARYNX:no exudate, no erythema and lips, buccal mucosa, and tongue normal  NECK: supple, thyroid normal size, non-tender, without nodularity LYMPH:  no palpable lymphadenopathy in the cervical, axillary or inguinal LUNGS: clear to auscultation and percussion with normal breathing effort HEART: regular rate & rhythm  ABDOMEN:abdomen soft, non-tender and normal bowel sounds Musculoskeletal:no cyanosis of digits and no clubbing.  No bilateral lower extremity edema PSYCH: alert & oriented x 3 with fluent speech NEURO: no focal motor/sensory deficits  LABORATORY DATA:  I have reviewed the data as listed Lab Results  Component Value Date   WBC 4.8 05/26/2020   HGB 12.7 05/26/2020   HCT 41.2 05/26/2020   MCV 84.1 05/26/2020   PLT 330 05/26/2020     Chemistry      Component Value Date/Time   NA 139 05/26/2020 1623   K 4.1 05/26/2020 1623   CL 103 05/26/2020 1623   CO2 21  (L) 05/26/2020 1623   BUN 11 05/26/2020 1623   CREATININE 0.73 05/26/2020 1623   CREATININE 0.64 09/17/2019 1554      Component Value Date/Time   CALCIUM 9.4 05/26/2020 1623   ALKPHOS 53 05/26/2020 1623   AST 14 (L) 05/26/2020 1623   ALT 11 05/26/2020 1623   BILITOT 0.3 05/26/2020 1623      RADIOGRAPHIC STUDIES: I have personally reviewed the radiological images as listed and agreed with the findings in the report. No results found. Labs from today pending  Time  I spent a total of 20 minutes in the care of this patient including history and physical, review of medical records, counseling and coordination of care for    Benay Pike, MD 09/07/2020 2:39 PM

## 2020-09-08 ENCOUNTER — Encounter (HOSPITAL_COMMUNITY)
Admission: RE | Admit: 2020-09-08 | Discharge: 2020-09-08 | Disposition: A | Discharge status: Home / Self Care | Payer: PRIVATE HEALTH INSURANCE | Source: Ambulatory Visit | Attending: Orthopedic Surgery | Admitting: Orthopedic Surgery

## 2020-09-08 ENCOUNTER — Other Ambulatory Visit: Payer: Self-pay

## 2020-09-08 DIAGNOSIS — Z01812 Encounter for preprocedural laboratory examination: Secondary | ICD-10-CM | POA: Diagnosis present

## 2020-09-08 LAB — CBC
HCT: 44.3 % (ref 36.0–46.0)
Hemoglobin: 14.2 g/dL (ref 12.0–15.0)
MCH: 29.5 pg (ref 26.0–34.0)
MCHC: 32.1 g/dL (ref 30.0–36.0)
MCV: 92.1 fL (ref 80.0–100.0)
Platelets: 283 10*3/uL (ref 150–400)
RBC: 4.81 MIL/uL (ref 3.87–5.11)
RDW: 14.6 % (ref 11.5–15.5)
WBC: 4.6 10*3/uL (ref 4.0–10.5)
nRBC: 0 % (ref 0.0–0.2)

## 2020-09-08 LAB — IRON AND TIBC
Iron: 60 ug/dL (ref 41–142)
Saturation Ratios: 19 % — ABNORMAL LOW (ref 21–57)
TIBC: 314 ug/dL (ref 236–444)
UIBC: 254 ug/dL (ref 120–384)

## 2020-09-08 LAB — FERRITIN: Ferritin: 16 ng/mL (ref 11–307)

## 2020-09-08 NOTE — Progress Notes (Signed)
Surgical Instructions                 Your procedure is scheduled on Tuesday July 12th.             Report to [facility] Main Entrance "A" at 10:30 A.M., then check in with the Admitting office.             Call this number if you have problems the morning of surgery:             [phone]    If you have any questions prior to your surgery date call [phone]: Open Monday-Friday 8am-4pm                 Remember:             Do not eat after midnight the night before your surgery   You may drink clear liquids until 09:30 A.M. the morning of your surgery.   Clear liquids allowed are: Water, Non-Citrus Juices (without pulp), Carbonated Beverages, Clear Tea, Black Coffee Only, and Gatorade Patient Instructions   The night before surgery:  No food after midnight. ONLY clear liquids after midnight   The day of surgery (if you do NOT have diabetes):  Drink ONE (1) Pre-Surgery Clear Ensure by 09:30 A.M. the morning of surgery. Drink in one sitting. Do not sip. This drink was given to you during your hospital pre-op appointment visit.   Nothing else to drink after completing the Pre-Surgery Clear Ensure.          If you have questions, please contact your surgeon's office.                            Take these medicines the morning of surgery with A SIP OF WATER             acetaminophen (TYLENOL) if needed     As of today, STOP taking any Aspirin (unless otherwise instructed by your surgeon) Aleve, Naproxen, Ibuprofen, Motrin, Advil, Goody's, BC's, all herbal medications, fish oil, and all vitamins.                     Do NOT Smoke (Tobacco/Vaping) or drink Alcohol 24 hours prior to your procedure.   If you use a CPAP at night, you may bring all equipment for your overnight stay.   Contacts, glasses, piercing's, hearing aid's, dentures or partials may not be worn into surgery, please bring cases for these belongings.   For patients admitted to the hospital, discharge time  will be determined by your treatment team.   Patients discharged the day of surgery will not be allowed to drive home, and someone needs to stay with them for 24 hours.   ONLY 1 SUPPORT PERSON MAY BE PRESENT WHILE YOU ARE IN SURGERY. IF YOU ARE TO BE ADMITTED ONCE YOU ARE IN YOUR ROOM YOU WILL BE ALLOWED TWO (2) VISITORS.  Minor children may have two parents present. Special consideration for safety and communication needs will be reviewed on a case by case basis.     Special instructions:   [facility]- Preparing For Surgery   Before surgery, you can play an important role. Because skin is not sterile, your skin needs to be as free of germs as possible. You can reduce the number of germs on your skin by washing with CHG (chlorahexidine gluconate) Soap before surgery.  CHG is an antiseptic cleaner  which kills germs and bonds with the skin to continue killing germs even after washing.     Oral Hygiene is also important to reduce your risk of infection.  Remember - BRUSH YOUR TEETH THE MORNING OF SURGERY WITH YOUR REGULAR TOOTHPASTE   Please do not use if you have an allergy to CHG or antibacterial soaps. If your skin becomes reddened/irritated stop using the CHG. Do not shave (including legs and underarms) for at least 48 hours prior to first CHG shower. It is OK to shave your face.   Please follow these instructions carefully.                                                                                                                               Shower the NIGHT BEFORE SURGERY and the MORNING OF SURGERY   If you chose to wash your hair, wash your hair first as usual with your normal shampoo.   After you shampoo, rinse your hair and body thoroughly to remove the shampoo.   Use CHG Soap as you would any other liquid soap. You can apply CHG directly to the skin and wash gently with a scrungie or a clean washcloth.    Apply the CHG Soap to your body ONLY FROM THE NECK DOWN.  Do not use on  open wounds or open sores. Avoid contact with your eyes, ears, mouth and genitals (private parts). Wash Face and genitals (private parts)  with your normal soap.   Wash thoroughly, paying special attention to the area where your surgery will be performed.   Thoroughly rinse your body with warm water from the neck down.   DO NOT shower/wash with your normal soap after using and rinsing off the CHG Soap.   Pat yourself dry with a CLEAN TOWEL.   Wear CLEAN PAJAMAS to bed the night before surgery   Place CLEAN SHEETS on your bed the night before your surgery   DO NOT SLEEP WITH PETS.     Day of Surgery: Shower with CHG soap. Do not wear jewelry, make up, nail polish, gel polish, artificial nails, or any other type of covering on natural nails including finger and toenails. If patients have artificial nails, gel coating, etc. that need to be removed by a nail salon please have this removed prior to surgery. Surgery may need to be canceled/delayed if the surgeon/ anesthesia feels like the patient is unable to be adequately monitored. Do not wear lotions, powders, perfumes/colognes, or deodorant. Do not shave 48 hours prior to surgery.  Men may shave face and neck. Do not bring valuables to the hospital. [facility] is not responsible for any belongings or valuables. Wear Clean/Comfortable clothing the morning of surgery Remember to brush your teeth WITH YOUR REGULAR TOOTHPASTE.   Please read over the following fact sheets that you were given.

## 2020-09-08 NOTE — Progress Notes (Addendum)
PCP - Alysia Penna, MD Cardiologist - Skeet Latch, MD  PPM/ICD - denies  Chest x-ray - n/a EKG - 02/23/20 Stress Test - denies ECHO - 02/25/20 Cardiac Cath - denies  Sleep Study - denies  DM: denies  ERAS Protcol -yes PRE-SURGERY Ensure given  COVID TEST- n/a, ambulatory surgery   Anesthesia review: Yes, pt states hx of post surgical complications (blood clots, sepsis after breast reduction surgery). She was advised to see her cardiologist prior to surgery in the past. Shelby Dubin, PA-C reviewed 02/25/20.  Patient denies shortness of breath, fever, cough and chest pain at PAT appointment   All instructions explained to the patient, with a verbal understanding of the material. Patient agrees to go over the instructions while at home for a better understanding. The opportunity to ask questions was provided.

## 2020-09-09 ENCOUNTER — Ambulatory Visit: Payer: PRIVATE HEALTH INSURANCE | Admitting: Psychology

## 2020-09-09 ENCOUNTER — Other Ambulatory Visit: Payer: Self-pay

## 2020-09-09 NOTE — Anesthesia Preprocedure Evaluation (Addendum)
Anesthesia Evaluation  Patient identified by MRN, date of birth, ID band Patient awake    Reviewed: Allergy & Precautions, NPO status , Patient's Chart, lab work & pertinent test results  History of Anesthesia Complications (+) PONV and history of anesthetic complications  Airway Mallampati: II  TM Distance: >3 FB Neck ROM: Full    Dental no notable dental hx.    Pulmonary former smoker, PE   Pulmonary exam normal breath sounds clear to auscultation       Cardiovascular Normal cardiovascular exam Rhythm:Regular Rate:Normal  ECHO: 1. Left ventricular ejection fraction, by estimation, is 70 to 75%. The left ventricle has hyperdynamic function. The left ventricle has no regional wall motion abnormalities. Left ventricular diastolic parameters are indeterminate. 2. Right ventricular systolic function is normal. The right ventricular size is normal. There is normal pulmonary artery systolic pressure. The estimated right ventricular systolic pressure is 03.7 mmHg. 3. The mitral valve is grossly normal. Mild mitral valve regurgitation. 4. The aortic valve is tricuspid. Aortic valve regurgitation is not visualized. 5. The inferior vena cava is dilated in size with >50% respiratory variability, suggesting right atrial pressure of 8 mmHg.   Neuro/Psych  Headaches, PSYCHIATRIC DISORDERS Anxiety Depression    GI/Hepatic negative GI ROS, Neg liver ROS,   Endo/Other  negative endocrine ROS  Renal/GU negative Renal ROS     Musculoskeletal negative musculoskeletal ROS (+)   Abdominal   Peds  Hematology negative hematology ROS (+)   Anesthesia Other Findings Right shoulder adhesive capsulitis, impingement  Reproductive/Obstetrics                            Anesthesia Physical Anesthesia Plan  ASA: 2  Anesthesia Plan: General and Regional   Post-op Pain Management: GA combined w/ Regional for post-op pain    Induction: Intravenous  PONV Risk Score and Plan: 4 or greater and Ondansetron, Dexamethasone, Midazolam, Treatment may vary due to age or medical condition and Scopolamine patch - Pre-op  Airway Management Planned: Oral ETT  Additional Equipment:   Intra-op Plan:   Post-operative Plan: Extubation in OR  Informed Consent: I have reviewed the patients History and Physical, chart, labs and discussed the procedure including the risks, benefits and alternatives for the proposed anesthesia with the patient or authorized representative who has indicated his/her understanding and acceptance.     Dental advisory given  Plan Discussed with: CRNA  Anesthesia Plan Comments: (Reviewed PAT note written 09/09/2020 by Myra Gianotti, PA-C. )      Anesthesia Quick Evaluation

## 2020-09-09 NOTE — Progress Notes (Signed)
Anesthesia Chart Review:  Case: [id number] Date/Time: 09/14/20 1221   Procedure: SHOULDER MANIPULATION UNDER ANESTHESIA, ARTHROSCOPIC LYSIS OF ADHESIONS AND SUBACROMIAL DECOMPRESSION (Right) - 90MINS   Anesthesia type: Choice   Pre-op diagnosis: Right shoulder adhesive capsulitis, impingement   Location: [facility] OR ROOM 05 / [place] OR   Surgeons: Nicholes Stairs, MD       DISCUSSION: Patient is a 50 year old female scheduled for the above procedure.   History includes former smoker, post-operative N/V, anemia, breast reduction complicated by sepsis and PE (2000), interstitial cystitis, palpitations, chronic back pain (s/p radiofrequency ablation to nerves), tubal sterilization (08/06/02), chronic headaches, uterine fibroid/menorrhagia (s/p hysterectomy/BSP 04/14/20), COVID-19 (03/01/20). She reported history of hypotension and shaking after anesthesia.   Prior to her hysterectomy, she had hematology and GI evaluations for severe anemia felt disproportionate to her menstrual blood loss. Colonoscopy "without any high risk pathology to cause this level of anemia". EGD done on 02/17/20 and showed H. Pylori gastritis, although not likely the primary cause of her anemia. Dr. Havery Moros is treating H. pylori ( 14 days amoxicillin, clarithromycin, Flagyl, omeprazole) and if aneima persists after hysterectomy, then would consider capsule study. Hematologist Dr. Chryl Heck was treating with IV iron and PRBC as needed.prior to hysterectomy which was done on 04/14/20.  Per her 09/07/20 visit with Dr. Chryl Heck, her last Venofer was in March. Her 09/07/20 CBC was normal. Oral iron, vitamin B12 and Vitamin D recommended for deficiencies.   She underwent a preoperative evaluation by [name], PA-C on 02/24/20 prior to her hysterectomy. Echo was ordered for dyspnea on exertion, although anemia felt to be likely contributing factor. Her CTA chest was negative for PE on 01/08/20. Following 02/25/20 echo results, she was felt "OK for  surgery."   Anesthesia team to evaluate on the day of surgery.   VS: BP 125/72   Pulse 91   Temp 36.7 C (Oral)   Resp 18   Ht 5' 4.5" (1.638 m)   Wt 70.5 kg   LMP 03/21/2020   SpO2 100%   BMI 26.28 kg/m    PROVIDERS: Laurey Morale, MD is PCP  - Sarina Ill, MD is neurologist - Skeet Latch, MD is cardiologist - Princess Bruins, MD is GYN - Benedict Cellar, MD is GI. Last visit 02/12/20 with Ellouise Newer, Michaelle Birks, MD is HEM. Last visit 09/08/19 for IDA follow-up.    LABS: CBC normal on 09/08/20. Most recent labs result include: Lab Results  Component Value Date   WBC 4.6 09/08/2020   HGB 14.2 09/08/2020   HCT 44.3 09/08/2020   PLT 283 09/08/2020   GLUCOSE 65 (L) 05/26/2020   ALT 11 05/26/2020   AST 14 (L) 05/26/2020   NA 139 05/26/2020   K 4.1 05/26/2020   CL 103 05/26/2020   CREATININE 0.73 05/26/2020   BUN 11 05/26/2020   CO2 21 (L) 05/26/2020     OTHER: EGD 02/17/20: Impression: - Esophagogastric landmarks identified. - Normal esophagus - 2 diminutive erosions in the pre pyloric area, no stigmata of bleeding or ulcers noted - Normal stomach otherwise - biopsies taken to rule out H pylori - Normal duodenal bulb and second portion of the duodenum. Biopsied. No overt cause of severe anemia on this exam, will await biopsy results. - Pathology showed H. pylori associated gastritis   Colonoscopy 06/25/19: Impression: - A single colonic angiodysplastic lesion. - One 3 mm polyp at the hepatic flexure, removed with a cold snare. Resected and retrieved. - Two  3 to 4 mm polyps in the sigmoid colon, removed with a cold snare. Resected and retrieved. - Diverticulosis in the sigmoid colon. - Tortuous colon. - The examination was otherwise normal.     IMAGES: Chest CTA 01/08/20: IMPRESSION: No pulmonary emboli or acute abnormality.   CXR 01/08/20: FINDINGS: Normal heart size, mediastinal contours, and pulmonary  vascularity. Lungs clear. No pleural effusion or pneumothorax. Bones unremarkable. IMPRESSION: Normal exam.     EKG: 02/23/20: Normal sinus rhythm Possible left atrial enlargement Borderline ECG     CV: Echo 02/25/20:  IMPRESSIONS   1. Left ventricular ejection fraction, by estimation, is 70 to 75%. The  left ventricle has hyperdynamic function. The left ventricle has no  regional wall motion abnormalities. Left ventricular diastolic parameters  are indeterminate.   2. Right ventricular systolic function is normal. The right ventricular  size is normal. There is normal pulmonary artery systolic pressure. The  estimated right ventricular systolic pressure is 15.1 mmHg.   3. The mitral valve is grossly normal. Mild mitral valve regurgitation.   4. The aortic valve is tricuspid. Aortic valve regurgitation is not  visualized.   5. The inferior vena cava is dilated in size with >50% respiratory  variability, suggesting right atrial pressure of 8 mmHg.     7 day Event monitor 02/21/18-02/27/18: Quality: Fair.  Baseline artifact. Predominant rhythm: sinus Average heart rate: 85 bpm Max heart rate: 152 bpm Min heart rate: 50 bpm <1% PACs and PVCs noted   Past Medical History:  Diagnosis Date   Anemia    iron deficiency    Blood transfusion without reported diagnosis    Cardiac arrhythmia    Cellulitis    Chronic headaches    Chronic lumbar pain since 2013   sees Dr. Arvilla Market    Clotting disorder [facility])    PE prior to pregnancy.   Complication of anesthesia    Bp drops and alot of shaking after anesthesia   COVID-19 03/01/2020   Interstitial cystitis    Palpitations 02/19/2018   PE (pulmonary embolism)    due to surgery   Pneumonia    PONV (postoperative nausea and vomiting)    Sepsis ([place])    due to surgery   Staph infection     Past Surgical History:  Procedure Laterality Date   BREAST REDUCTION SURGERY     COLONOSCOPY  06/25/2019   per Dr.  Havery Moros, adeomatous polyps, repeat in 3 yrs    [place]     with left ovary and mass removed   RADIOFREQUENCY ABLATION NERVES     lumbar spine, per Dr. Ace Gins    ROBOTIC ASSISTED LAPAROSCOPIC HYSTERECTOMY AND SALPINGECTOMY Bilateral 04/14/2020   Procedure: XI ROBOTIC ASSISTED TOTAL LAPAROSCOPIC HYSTERECTOMY AND SALPINGECTOMY;  Surgeon: Princess Bruins, MD;  Location: [place];  Service: Gynecology;  Laterality: Bilateral;   TUBAL LIGATION     UPPER GASTROINTESTINAL ENDOSCOPY      MEDICATIONS:  acetaminophen (TYLENOL) 500 MG tablet   Carboxymethylcell-Hypromellose (GENTEAL OP)   cholecalciferol (VITAMIN D3) 25 MCG (1000 UNIT) tablet   ibuprofen (ADVIL) 200 MG tablet   ibuprofen (ADVIL) 800 MG tablet   vitamin B-12 (CYANOCOBALAMIN) 1000 MCG tablet   zolpidem (AMBIEN) 10 MG tablet   No current facility-administered medications for this encounter.    Myra Gianotti, PA-C Surgical Short Stay/Anesthesiology [facility] Phone [phone] [employer] Phone [phone] 09/09/2020 4:08 PM

## 2020-09-12 MED ORDER — ZOLPIDEM TARTRATE 10 MG PO TABS: 10.0000 mg | ORAL_TABLET | Freq: Every evening | ORAL | 0 refills | Status: DC | PRN

## 2020-09-13 ENCOUNTER — Other Ambulatory Visit: Payer: Self-pay

## 2020-09-13 ENCOUNTER — Encounter: Payer: PRIVATE HEALTH INSURANCE | Attending: Psychology | Admitting: Psychology

## 2020-09-13 DIAGNOSIS — G43711 Chronic migraine without aura, intractable, with status migrainosus: Secondary | ICD-10-CM

## 2020-09-13 DIAGNOSIS — F0781 Postconcussional syndrome: Secondary | ICD-10-CM

## 2020-09-13 DIAGNOSIS — G9050 Complex regional pain syndrome I, unspecified: Secondary | ICD-10-CM | POA: Diagnosis present

## 2020-09-13 DIAGNOSIS — F4310 Post-traumatic stress disorder, unspecified: Secondary | ICD-10-CM | POA: Diagnosis present

## 2020-09-14 ENCOUNTER — Encounter (HOSPITAL_COMMUNITY): Payer: Self-pay | Admitting: Orthopedic Surgery

## 2020-09-14 ENCOUNTER — Ambulatory Visit (HOSPITAL_COMMUNITY)
Admission: RE | Admit: 2020-09-14 | Discharge: 2020-09-14 | Disposition: A | Discharge status: Home / Self Care | Payer: PRIVATE HEALTH INSURANCE | Attending: Orthopedic Surgery | Admitting: Orthopedic Surgery

## 2020-09-14 ENCOUNTER — Encounter (HOSPITAL_COMMUNITY)
Admission: RE | Disposition: A | Discharge status: Home / Self Care | Payer: Self-pay | Source: Home / Self Care | Attending: Orthopedic Surgery

## 2020-09-14 ENCOUNTER — Ambulatory Visit (HOSPITAL_COMMUNITY): Payer: PRIVATE HEALTH INSURANCE | Admitting: Vascular Surgery

## 2020-09-14 ENCOUNTER — Ambulatory Visit (HOSPITAL_COMMUNITY): Payer: PRIVATE HEALTH INSURANCE | Admitting: Anesthesiology

## 2020-09-14 DIAGNOSIS — M75111 Incomplete rotator cuff tear or rupture of right shoulder, not specified as traumatic: Secondary | ICD-10-CM | POA: Insufficient documentation

## 2020-09-14 DIAGNOSIS — Z79899 Other long term (current) drug therapy: Secondary | ICD-10-CM | POA: Insufficient documentation

## 2020-09-14 DIAGNOSIS — Z8616 Personal history of COVID-19: Secondary | ICD-10-CM | POA: Diagnosis not present

## 2020-09-14 DIAGNOSIS — M7501 Adhesive capsulitis of right shoulder: Secondary | ICD-10-CM | POA: Insufficient documentation

## 2020-09-14 DIAGNOSIS — Z86711 Personal history of pulmonary embolism: Secondary | ICD-10-CM | POA: Diagnosis not present

## 2020-09-14 DIAGNOSIS — M7521 Bicipital tendinitis, right shoulder: Secondary | ICD-10-CM | POA: Diagnosis not present

## 2020-09-14 DIAGNOSIS — M7551 Bursitis of right shoulder: Secondary | ICD-10-CM | POA: Diagnosis not present

## 2020-09-14 DIAGNOSIS — M25811 Other specified joint disorders, right shoulder: Secondary | ICD-10-CM | POA: Diagnosis not present

## 2020-09-14 DIAGNOSIS — Z87891 Personal history of nicotine dependence: Secondary | ICD-10-CM | POA: Insufficient documentation

## 2020-09-14 HISTORY — PX: SHOULDER ARTHROSCOPY WITH SUBACROMIAL DECOMPRESSION: SHX5684

## 2020-09-14 SURGERY — SHOULDER ARTHROSCOPY WITH SUBACROMIAL DECOMPRESSION
Anesthesia: Regional | Laterality: Right

## 2020-09-14 MED ORDER — ONDANSETRON HCL 4 MG/2ML IJ SOLN
INTRAMUSCULAR | Status: DC | PRN
  Administered 2020-09-14: 4 mg via INTRAVENOUS

## 2020-09-14 MED ORDER — SCOPOLAMINE 1 MG/3DAYS TD PT72
1.0000 | MEDICATED_PATCH | TRANSDERMAL | Status: DC
  Filled 2020-09-14: qty 1

## 2020-09-14 MED ORDER — METHOCARBAMOL 500 MG PO TABS: 500.0000 mg | ORAL_TABLET | Freq: Four times a day (QID) | ORAL | 1 refills | Status: DC | PRN

## 2020-09-14 MED ORDER — PROPOFOL 10 MG/ML IV BOLUS
INTRAVENOUS | Status: AC
  Filled 2020-09-14: qty 20

## 2020-09-14 MED ORDER — PROMETHAZINE HCL 25 MG/ML IJ SOLN: 6.2500 mg | INTRAMUSCULAR | Status: DC | PRN

## 2020-09-14 MED ORDER — CEFAZOLIN SODIUM-DEXTROSE 2-4 GM/100ML-% IV SOLN
INTRAVENOUS | Status: AC
  Filled 2020-09-14: qty 100

## 2020-09-14 MED ORDER — ROCURONIUM BROMIDE 10 MG/ML (PF) SYRINGE
PREFILLED_SYRINGE | INTRAVENOUS | Status: DC | PRN
  Administered 2020-09-14: 40 mg via INTRAVENOUS

## 2020-09-14 MED ORDER — OXYCODONE HCL 5 MG PO TABS: 5.0000 mg | ORAL_TABLET | Freq: Three times a day (TID) | ORAL | 0 refills | Status: DC | PRN

## 2020-09-14 MED ORDER — MIDAZOLAM HCL 2 MG/2ML IJ SOLN: 2.0000 mg | Freq: Once | INTRAMUSCULAR | Status: AC

## 2020-09-14 MED ORDER — SUGAMMADEX SODIUM 200 MG/2ML IV SOLN
INTRAVENOUS | Status: DC | PRN
  Administered 2020-09-14: 1500 mg via INTRAVENOUS

## 2020-09-14 MED ORDER — ACETAMINOPHEN 10 MG/ML IV SOLN: 1000.0000 mg | Freq: Once | INTRAVENOUS | Status: DC | PRN

## 2020-09-14 MED ORDER — ONDANSETRON HCL 4 MG/2ML IJ SOLN
INTRAMUSCULAR | Status: AC
  Filled 2020-09-14: qty 2

## 2020-09-14 MED ORDER — SODIUM CHLORIDE 0.9 % IR SOLN
Status: DC | PRN
  Administered 2020-09-14 (×2): 3000 mL

## 2020-09-14 MED ORDER — PROPOFOL 10 MG/ML IV BOLUS
INTRAVENOUS | Status: DC | PRN
  Administered 2020-09-14: 150 mg via INTRAVENOUS

## 2020-09-14 MED ORDER — DEXAMETHASONE SODIUM PHOSPHATE 10 MG/ML IJ SOLN
INTRAMUSCULAR | Status: AC
  Filled 2020-09-14: qty 1

## 2020-09-14 MED ORDER — OXYCODONE HCL 5 MG PO TABS: 5.0000 mg | ORAL_TABLET | Freq: Once | ORAL | Status: DC | PRN

## 2020-09-14 MED ORDER — OXYCODONE HCL 5 MG/5ML PO SOLN: 5.0000 mg | Freq: Once | ORAL | Status: DC | PRN

## 2020-09-14 MED ORDER — LIDOCAINE 2% (20 MG/ML) 5 ML SYRINGE
INTRAMUSCULAR | Status: AC
  Filled 2020-09-14: qty 5

## 2020-09-14 MED ORDER — FENTANYL CITRATE (PF) 100 MCG/2ML IJ SOLN
INTRAMUSCULAR | Status: AC
  Filled 2020-09-14: qty 2

## 2020-09-14 MED ORDER — LACTATED RINGERS IV SOLN: INTRAVENOUS | Status: DC

## 2020-09-14 MED ORDER — ORAL CARE MOUTH RINSE: 15.0000 mL | Freq: Once | OROMUCOSAL | Status: AC

## 2020-09-14 MED ORDER — CHLORHEXIDINE GLUCONATE 0.12 % MT SOLN
OROMUCOSAL | Status: AC
  Administered 2020-09-14: 15 mL via OROMUCOSAL
  Filled 2020-09-14: qty 15

## 2020-09-14 MED ORDER — CHLORHEXIDINE GLUCONATE 0.12 % MT SOLN: 15.0000 mL | Freq: Once | OROMUCOSAL | Status: AC

## 2020-09-14 MED ORDER — CEFAZOLIN SODIUM-DEXTROSE 2-4 GM/100ML-% IV SOLN
2.0000 g | INTRAVENOUS | Status: AC
  Administered 2020-09-14: 2 g via INTRAVENOUS

## 2020-09-14 MED ORDER — AMISULPRIDE (ANTIEMETIC) 5 MG/2ML IV SOLN
10.0000 mg | Freq: Once | INTRAVENOUS | Status: DC | PRN
Start: 2020-09-14 — End: 2020-09-14

## 2020-09-14 MED ORDER — FENTANYL CITRATE (PF) 250 MCG/5ML IJ SOLN
INTRAMUSCULAR | Status: AC
  Filled 2020-09-14: qty 5

## 2020-09-14 MED ORDER — LIDOCAINE 2% (20 MG/ML) 5 ML SYRINGE
INTRAMUSCULAR | Status: DC | PRN
  Administered 2020-09-14: 40 mg via INTRAVENOUS

## 2020-09-14 MED ORDER — DEXAMETHASONE SODIUM PHOSPHATE 10 MG/ML IJ SOLN
INTRAMUSCULAR | Status: DC | PRN
  Administered 2020-09-14: 5 mg via INTRAVENOUS

## 2020-09-14 MED ORDER — FENTANYL CITRATE (PF) 250 MCG/5ML IJ SOLN
INTRAMUSCULAR | Status: DC | PRN
  Administered 2020-09-14: 100 ug via INTRAVENOUS

## 2020-09-14 MED ORDER — ONDANSETRON 4 MG PO TBDP: 4.0000 mg | ORAL_TABLET | Freq: Three times a day (TID) | ORAL | 0 refills | Status: DC | PRN

## 2020-09-14 MED ORDER — BUPIVACAINE LIPOSOME 1.3 % IJ SUSP
INTRAMUSCULAR | Status: DC | PRN
  Administered 2020-09-14: 10 mL via PERINEURAL

## 2020-09-14 MED ORDER — BUPIVACAINE HCL (PF) 0.5 % IJ SOLN
INTRAMUSCULAR | Status: DC | PRN
  Administered 2020-09-14: 15 mL via PERINEURAL

## 2020-09-14 MED ORDER — SCOPOLAMINE 1 MG/3DAYS TD PT72
MEDICATED_PATCH | TRANSDERMAL | Status: AC
  Administered 2020-09-14: 1.5 mg via TRANSDERMAL
  Filled 2020-09-14: qty 1

## 2020-09-14 MED ORDER — MIDAZOLAM HCL 2 MG/2ML IJ SOLN
INTRAMUSCULAR | Status: AC
  Administered 2020-09-14: 2 mg via INTRAVENOUS
  Filled 2020-09-14: qty 2

## 2020-09-14 MED ORDER — FENTANYL CITRATE (PF) 100 MCG/2ML IJ SOLN: 25.0000 ug | INTRAMUSCULAR | Status: DC | PRN

## 2020-09-14 MED ORDER — KETOROLAC TROMETHAMINE 30 MG/ML IJ SOLN: 30.0000 mg | Freq: Once | INTRAMUSCULAR | Status: DC

## 2020-09-14 MED ORDER — ROCURONIUM BROMIDE 10 MG/ML (PF) SYRINGE
PREFILLED_SYRINGE | INTRAVENOUS | Status: AC
  Filled 2020-09-14: qty 10

## 2020-09-14 SURGICAL SUPPLY — 38 items
BAG COUNTER SPONGE SURGICOUNT (BAG) ×2 IMPLANT
BAG SPNG CNTER NS LX DISP (BAG) ×1
BLADE SURG 11 STRL SS (BLADE) ×2 IMPLANT
BURR OVAL 8 FLU 4.0X13 (MISCELLANEOUS) ×1 IMPLANT
CANNULA TWIST IN 8.25X7CM (CANNULA) ×2 IMPLANT
CLSR STERI-STRIP ANTIMIC 1/2X4 (GAUZE/BANDAGES/DRESSINGS) ×1 IMPLANT
CUTTER BONE 4.0MM X 13CM (MISCELLANEOUS) ×1 IMPLANT
DRAPE STERI 35X30 U-POUCH (DRAPES) ×2 IMPLANT
DRAPE U-SHAPE 47X51 STRL (DRAPES) ×2 IMPLANT
DRSG PAD ABDOMINAL 8X10 ST (GAUZE/BANDAGES/DRESSINGS) ×2 IMPLANT
DURAPREP 26ML APPLICATOR (WOUND CARE) ×2 IMPLANT
DW OUTFLOW CASSETTE/TUBE SET (MISCELLANEOUS) ×2 IMPLANT
GAUZE SPONGE 4X4 12PLY STRL (GAUZE/BANDAGES/DRESSINGS) ×2 IMPLANT
GLOVE SRG 8 PF TXTR STRL LF DI (GLOVE) ×1 IMPLANT
GLOVE SURG ENC MOIS LTX SZ7.5 (GLOVE) ×4 IMPLANT
GLOVE SURG UNDER POLY LF SZ8 (GLOVE) ×2
GOWN STRL REUS W/ TWL LRG LVL3 (GOWN DISPOSABLE) ×1 IMPLANT
GOWN STRL REUS W/TWL LRG LVL3 (GOWN DISPOSABLE) ×2
GOWN STRL REUS W/TWL XL LVL3 (GOWN DISPOSABLE) ×4 IMPLANT
KIT BASIN OR (CUSTOM PROCEDURE TRAY) ×2 IMPLANT
KIT TURNOVER KIT B (KITS) ×2 IMPLANT
MANIFOLD NEPTUNE II (INSTRUMENTS) ×2 IMPLANT
NDL SPNL 18GX3.5 QUINCKE PK (NEEDLE) ×1 IMPLANT
NEEDLE SPNL 18GX3.5 QUINCKE PK (NEEDLE) ×2 IMPLANT
NS IRRIG 1000ML POUR BTL (IV SOLUTION) ×2 IMPLANT
PACK SHOULDER (CUSTOM PROCEDURE TRAY) ×2 IMPLANT
PAD ARMBOARD 7.5X6 YLW CONV (MISCELLANEOUS) ×4 IMPLANT
SLEEVE ARM SUSPENSION SYSTEM (MISCELLANEOUS) ×2 IMPLANT
SPONGE T-LAP 4X18 ~~LOC~~+RFID (SPONGE) ×2 IMPLANT
SUT ETHILON 3 0 PS 1 (SUTURE) ×2 IMPLANT
SUT MNCRL AB 3-0 PS2 27 (SUTURE) ×1 IMPLANT
TAPE CLOTH SURG 4X10 WHT LF (GAUZE/BANDAGES/DRESSINGS) ×1 IMPLANT
TAPE PAPER 3X10 WHT MICROPORE (GAUZE/BANDAGES/DRESSINGS) ×2 IMPLANT
TOWEL GREEN STERILE (TOWEL DISPOSABLE) ×2 IMPLANT
TOWEL GREEN STERILE FF (TOWEL DISPOSABLE) ×2 IMPLANT
TUBE CONNECTING 20X1/4 (TUBING) ×1 IMPLANT
TUBING ARTHROSCOPY IRRIG 16FT (MISCELLANEOUS) ×2 IMPLANT
WATER STERILE IRR 1000ML POUR (IV SOLUTION) ×2 IMPLANT

## 2020-09-14 NOTE — Brief Op Note (Signed)
09/14/2020  1:50 PM  PATIENT:  [name]  50 y.o. female  PRE-OPERATIVE DIAGNOSIS:  Right shoulder adhesive capsulitis, impingement  POST-OPERATIVE DIAGNOSIS:  Right shoulder adhesive capsulitis, impingement  PROCEDURE:  Procedure(s) with comments: SHOULDER MANIPULATION UNDER ANESTHESIA, ARTHROSCOPIC LYSIS OF ADHESIONS AND SUBACROMIAL DECOMPRESSION (Right) - 90MINS  SURGEON:  Surgeon(s) and Role:    * Nicholes Stairs, MD - Primary  PHYSICIAN ASSISTANT: Jonelle Sidle, PA-C   ANESTHESIA:   regional and general  EBL:  5 cc  BLOOD ADMINISTERED:none  DRAINS: none   LOCAL MEDICATIONS USED:  NONE  SPECIMEN:  No Specimen  DISPOSITION OF SPECIMEN:  N/A  COUNTS:  YES  TOURNIQUET:  * No tourniquets in log *  DICTATION: .Note written in EPIC  PLAN OF CARE: Discharge to home after PACU  PATIENT DISPOSITION:  PACU - hemodynamically stable.   Delay start of Pharmacological VTE agent (>24hrs) due to surgical blood loss or risk of bleeding: not applicable

## 2020-09-14 NOTE — Anesthesia Procedure Notes (Signed)
Procedure Name: Intubation Date/Time: 09/14/2020 12:59 PM Performed by: Imagene Riches, CRNA Pre-anesthesia Checklist: Patient identified, Emergency Drugs available, Suction available and Patient being monitored Patient Re-evaluated:Patient Re-evaluated prior to induction Oxygen Delivery Method: Circle System Utilized Preoxygenation: Pre-oxygenation with 100% oxygen Induction Type: IV induction Ventilation: Mask ventilation without difficulty Laryngoscope Size: Miller and 2 Grade View: Grade I Tube type: Oral Tube size: 7.0 mm Number of attempts: 1 Airway Equipment and Method: Stylet and Oral airway Placement Confirmation: ETT inserted through vocal cords under direct vision, positive ETCO2 and breath sounds checked- equal and bilateral Secured at: 22 cm Tube secured with: Tape Dental Injury: Teeth and Oropharynx as per pre-operative assessment
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
## 2020-09-14 NOTE — Discharge Instructions (Signed)
Orthopedic discharge instructions:  -You may remove the sling as soon as your nerve block has resolved to the right shoulder.  At that time we would encourage you to move the arm is much as possible.  Should also be sure to move your elbow, hand and wrist as well to avoid stiffness in those areas.  -Apply ice to the right shoulder liberally throughout the day for 20 to 30 minutes each hour that you are awake.  She do this around-the-clock as often as possible.  -For mild to moderate pain use Tylenol and Advil as needed every 6 hours respectively.  For breakthrough pain use oxycodone as necessary.  -Return to see Dr. Stann Mainland in the office in 2 weeks for a routine postoperative check.

## 2020-09-14 NOTE — Anesthesia Procedure Notes (Signed)
Anesthesia Regional Block: Interscalene brachial plexus block   Pre-Anesthetic Checklist: , timeout performed,  Correct Patient, Correct Site, Correct Laterality,  Correct Procedure, Correct Position, site marked,  Risks and benefits discussed,  Surgical consent,  Pre-op evaluation,  At surgeon's request and post-op pain management  Laterality: Right  Prep: chloraprep       Needles:  Injection technique: Single-shot  Needle Type: Echogenic Stimulator Needle     Needle Length: 9cm  Needle Gauge: 21     Additional Needles:   Procedures:,,,, ultrasound used (permanent image in chart),,    Narrative:  Start time: 09/14/2020 12:15 PM End time: 09/14/2020 12:25 PM Injection made incrementally with aspirations every 5 mL.  Performed by: Personally  Anesthesiologist: Murvin Natal, MD  Additional Notes: Functioning IV was confirmed and monitors were applied.  A timeout was performed. Sterile prep, hand hygiene and sterile gloves were used. A 64mm 21ga Arrow echogenic stimulator needle was used. Negative aspiration and negative test dose prior to incremental administration of local anesthetic. The patient tolerated the procedure well.  Ultrasound guidance: relevent anatomy identified, needle position confirmed, local anesthetic spread visualized around nerve(s), vascular puncture avoided.  Image printed for medical record.

## 2020-09-14 NOTE — Transfer of Care (Signed)
Immediate Anesthesia Transfer of Care Note  Patient: [name]  Procedure(s) Performed: SHOULDER MANIPULATION UNDER ANESTHESIA, ARTHROSCOPIC LYSIS OF ADHESIONS AND SUBACROMIAL DECOMPRESSION (Right)  Patient Location: PACU  Anesthesia Type:GA combined with regional for post-op pain  Level of Consciousness: drowsy  Airway & Oxygen Therapy: Patient Spontanous Breathing and Patient connected to nasal cannula oxygen  Post-op Assessment: Report given to RN and Post -op Vital signs reviewed and stable  Post vital signs: Reviewed and stable  Last Vitals:  Vitals Value Taken Time  BP 116/77 09/14/20 1407  Temp    Pulse 95 09/14/20 1408  Resp 18 09/14/20 1408  SpO2 100 % 09/14/20 1408  Vitals shown include unvalidated device data.  Last Pain:  Vitals:   09/14/20 1110  TempSrc:   PainSc: 4          Complications: No notable events documented.
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
## 2020-09-15 ENCOUNTER — Encounter (HOSPITAL_COMMUNITY): Payer: Self-pay | Admitting: Orthopedic Surgery

## 2020-09-15 NOTE — Anesthesia Postprocedure Evaluation (Signed)
Anesthesia Post Note  Patient: [name]  Procedure(s) Performed: SHOULDER MANIPULATION UNDER ANESTHESIA, ARTHROSCOPIC LYSIS OF ADHESIONS AND SUBACROMIAL DECOMPRESSION (Right)     Patient location during evaluation: PACU Anesthesia Type: Regional and General Level of consciousness: awake Pain management: pain level controlled Vital Signs Assessment: post-procedure vital signs reviewed and stable Respiratory status: spontaneous breathing, nonlabored ventilation, respiratory function stable and patient connected to nasal cannula oxygen Cardiovascular status: blood pressure returned to baseline and stable Postop Assessment: no apparent nausea or vomiting Anesthetic complications: no   No notable events documented.  Last Vitals:  Vitals:   09/14/20 1404 09/14/20 1436  BP: 116/77 129/77  Pulse: 92   Resp: 19 16  Temp: (!) 36.3 C   SpO2: 99% 100%    Last Pain:  Vitals:   09/14/20 1436  TempSrc:   PainSc: 0-No pain                 Anysia Choi P Jyren Cerasoli

## 2020-09-16 ENCOUNTER — Encounter: Payer: Self-pay | Admitting: Psychology
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
## 2020-09-23 ENCOUNTER — Ambulatory Visit: Payer: PRIVATE HEALTH INSURANCE | Admitting: Psychology

## 2020-09-29 ENCOUNTER — Ambulatory Visit
Admission: RE | Admit: 2020-09-29 | Discharge: 2020-09-29 | Disposition: A | Discharge status: Home / Self Care | Payer: PRIVATE HEALTH INSURANCE | Source: Ambulatory Visit | Attending: Obstetrics & Gynecology | Admitting: Obstetrics & Gynecology

## 2020-09-29 ENCOUNTER — Other Ambulatory Visit: Payer: Self-pay

## 2020-09-29 DIAGNOSIS — N63 Unspecified lump in unspecified breast: Secondary | ICD-10-CM

## 2020-09-30 ENCOUNTER — Encounter: Payer: PRIVATE HEALTH INSURANCE | Admitting: Psychology

## 2020-09-30 ENCOUNTER — Other Ambulatory Visit: Payer: Self-pay

## 2020-09-30 DIAGNOSIS — F4310 Post-traumatic stress disorder, unspecified: Secondary | ICD-10-CM | POA: Diagnosis not present

## 2020-09-30 DIAGNOSIS — F0781 Postconcussional syndrome: Secondary | ICD-10-CM | POA: Diagnosis not present

## 2020-09-30 DIAGNOSIS — G43711 Chronic migraine without aura, intractable, with status migrainosus: Secondary | ICD-10-CM | POA: Diagnosis not present

## 2020-09-30 DIAGNOSIS — G9050 Complex regional pain syndrome I, unspecified: Secondary | ICD-10-CM

## 2020-10-04 ENCOUNTER — Encounter: Payer: Self-pay | Admitting: Psychology
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
## 2020-10-12 ENCOUNTER — Other Ambulatory Visit: Payer: Self-pay

## 2020-10-12 ENCOUNTER — Ambulatory Visit (INDEPENDENT_AMBULATORY_CARE_PROVIDER_SITE_OTHER): Payer: PRIVATE HEALTH INSURANCE | Admitting: Family Medicine

## 2020-10-12 ENCOUNTER — Encounter: Payer: Self-pay | Admitting: Family Medicine

## 2020-10-12 VITALS — BP 120/80 | HR 67 | Ht 64.0 in | Wt 157.0 lb

## 2020-10-12 DIAGNOSIS — E538 Deficiency of other specified B group vitamins: Secondary | ICD-10-CM

## 2020-10-12 DIAGNOSIS — Z0000 Encounter for general adult medical examination without abnormal findings: Secondary | ICD-10-CM | POA: Diagnosis not present

## 2020-10-12 DIAGNOSIS — E559 Vitamin D deficiency, unspecified: Secondary | ICD-10-CM | POA: Insufficient documentation

## 2020-10-12 MED ORDER — VITAMIN D (ERGOCALCIFEROL) 50000 UNITS PO CAPS: 1.0000 | ORAL_CAPSULE | ORAL | 3 refills | Status: DC

## 2020-10-12 MED ORDER — ZOLPIDEM TARTRATE 10 MG PO TABS: 10.0000 mg | ORAL_TABLET | Freq: Every evening | ORAL | 1 refills | Status: DC | PRN

## 2020-10-12 MED ORDER — "LUER LOCK SAFETY SYRINGES 25G X 1"" 3 ML MISC": 1.0000 "application " | 3 refills | Status: DC

## 2020-10-12 MED ORDER — CYANOCOBALAMIN 1000 MCG/ML IJ SOLN: 1000.0000 ug | INTRAMUSCULAR | 11 refills | Status: DC

## 2020-10-12 NOTE — Progress Notes (Signed)
Subjective:    Patient ID: [name], female    DOB: 1970/08/21, 50 y.o.   MRN: [id number]  HPI Here for a well exam. She had developed adhesive capsulitis in the right shoulder, and on 09-14-20 she had a manipulation under anesthesia and arthroscopic lysis of adhesions by Dr. Victorino December. She has been getting PT and she is slowly regaining some ROM of the shoulder. The pain has improved and she been able to get off narcotic medications and is taking Ibuprofen for pain. The hospital stay was complicated by her anemia. This anemia is multifactorial because she has dealing with both B12 and iron deficiencies, and she has heavy menses each month. Her last Hgb got up to 13.6 after several blood transfusions. This got as low as 6 at one point. Her last B12 one month ago was low at 176. Her last ferritin was low at 16, but the serum iron is up slightly to 60. She has been taking oral iron and B12 supplements, but these have not had a lot of effect. She also has vitamin D deficiency and her last level a month ago was 20.96. She admits to feeling exhausted for the past 6 months, no matter how much sleep she gets. Her palpitations have been well controlled, and her BP has been stable. Her anxiety is stable off medications, and she sleeps well using Zolpidem.   Review of Systems  Constitutional:  Positive for fatigue.  HENT: Negative.    Eyes: Negative.   Respiratory: Negative.    Cardiovascular: Negative.   Gastrointestinal: Negative.   Genitourinary:  Negative for decreased urine volume, difficulty urinating, dyspareunia, dysuria, enuresis, flank pain, frequency, hematuria, pelvic pain and urgency.  Musculoskeletal: Negative.   Skin: Negative.   Neurological: Negative.  Negative for headaches.  Psychiatric/Behavioral: Negative.        Objective:   Physical Exam Constitutional:      General: She is not in acute distress.    Appearance: Normal appearance. She is well-developed.  HENT:      Head: Normocephalic and atraumatic.     Right Ear: External ear normal.     Left Ear: External ear normal.     Nose: Nose normal.     Mouth/Throat:     Pharynx: No oropharyngeal exudate.  Eyes:     General: No scleral icterus.    Conjunctiva/sclera: Conjunctivae normal.     Pupils: Pupils are equal, round, and reactive to light.  Neck:     Thyroid: No thyromegaly.     Vascular: No JVD.  Cardiovascular:     Rate and Rhythm: Normal rate and regular rhythm.     Heart sounds: Normal heart sounds. No murmur heard.   No friction rub. No gallop.  Pulmonary:     Effort: Pulmonary effort is normal. No respiratory distress.     Breath sounds: Normal breath sounds. No wheezing or rales.  Chest:     Chest wall: No tenderness.  Abdominal:     General: Bowel sounds are normal. There is no distension.     Palpations: Abdomen is soft. There is no mass.     Tenderness: There is no abdominal tenderness. There is no guarding or rebound.  Musculoskeletal:        General: No tenderness. Normal range of motion.     Cervical back: Normal range of motion and neck supple.  Lymphadenopathy:     Cervical: No cervical adenopathy.  Skin:    General: Skin is warm  and dry.     Findings: No erythema or rash.  Neurological:     Mental Status: She is alert and oriented to person, place, and time.     Cranial Nerves: No cranial nerve deficit.     Motor: No abnormal muscle tone.     Coordination: Coordination normal.     Deep Tendon Reflexes: Reflexes are normal and symmetric. Reflexes normal.  Psychiatric:        Behavior: Behavior normal.        Thought Content: Thought content normal.        Judgment: Judgment normal.          Assessment & Plan:  Well exam. We discussed diet and exercise. Get fasting labs today. We will check levels for vitamins D and B12. We will begin B12 shots, starting with weekly shots for 12 weeks. We will then recheck a level. We ill also start her on 50,000 units of vitamin  D weekly. We will check a thyroid panel since this has not been looked at in several years.  Alysia Penna, MD

## 2020-10-13 LAB — VITAMIN D 25 HYDROXY (VIT D DEFICIENCY, FRACTURES): VITD: 19.82 ng/mL — ABNORMAL LOW (ref 30.00–100.00)

## 2020-10-13 LAB — CBC WITH DIFFERENTIAL/PLATELET
Basophils Absolute: 0.1 10*3/uL (ref 0.0–0.1)
Basophils Relative: 1.3 % (ref 0.0–3.0)
Eosinophils Absolute: 0.2 10*3/uL (ref 0.0–0.7)
Eosinophils Relative: 4.5 % (ref 0.0–5.0)
HCT: 40.8 % (ref 36.0–46.0)
Hemoglobin: 13.5 g/dL (ref 12.0–15.0)
Lymphocytes Relative: 28.2 % (ref 12.0–46.0)
Lymphs Abs: 1.5 10*3/uL (ref 0.7–4.0)
MCHC: 33.1 g/dL (ref 30.0–36.0)
MCV: 91.9 fl (ref 78.0–100.0)
Monocytes Absolute: 0.4 10*3/uL (ref 0.1–1.0)
Monocytes Relative: 6.8 % (ref 3.0–12.0)
Neutro Abs: 3.1 10*3/uL (ref 1.4–7.7)
Neutrophils Relative %: 59.2 % (ref 43.0–77.0)
Platelets: 272 10*3/uL (ref 150.0–400.0)
RBC: 4.44 Mil/uL (ref 3.87–5.11)
RDW: 13.6 % (ref 11.5–15.5)
WBC: 5.2 10*3/uL (ref 4.0–10.5)

## 2020-10-13 LAB — BASIC METABOLIC PANEL
BUN: 16 mg/dL (ref 6–23)
CO2: 24 mEq/L (ref 19–32)
Calcium: 9.6 mg/dL (ref 8.4–10.5)
Chloride: 101 mEq/L (ref 96–112)
Creatinine, Ser: 0.73 mg/dL (ref 0.40–1.20)
GFR: 97.47 mL/min (ref >60.00)
Glucose, Bld: 68 mg/dL — ABNORMAL LOW (ref 70–99)
Potassium: 4.2 mEq/L (ref 3.5–5.1)
Sodium: 135 mEq/L (ref 135–145)

## 2020-10-13 LAB — HEMOGLOBIN A1C: Hgb A1c MFr Bld: 5.6 % (ref 4.6–6.5)

## 2020-10-13 LAB — LIPID PANEL
Cholesterol: 242 mg/dL — ABNORMAL HIGH (ref 0–200)
HDL: 87.5 mg/dL (ref >39.00)
LDL Cholesterol: 146 mg/dL — ABNORMAL HIGH (ref 0–99)
NonHDL: 154.08
Total CHOL/HDL Ratio: 3
Triglycerides: 42 mg/dL (ref 0.0–149.0)
VLDL: 8.4 mg/dL (ref 0.0–40.0)

## 2020-10-13 LAB — VITAMIN B12: Vitamin B-12: 212 pg/mL (ref 211–911)

## 2020-10-13 LAB — HEPATIC FUNCTION PANEL
ALT: 9 U/L (ref 0–35)
AST: 11 U/L (ref 0–37)
Albumin: 4.1 g/dL (ref 3.5–5.2)
Alkaline Phosphatase: 44 U/L (ref 39–117)
Bilirubin, Direct: 0.1 mg/dL (ref 0.0–0.3)
Total Bilirubin: 0.5 mg/dL (ref 0.2–1.2)
Total Protein: 6.5 g/dL (ref 6.0–8.3)

## 2020-10-13 LAB — TSH: TSH: 1.78 u[IU]/mL (ref 0.35–5.50)

## 2020-10-13 LAB — T3, FREE: T3, Free: 2.6 pg/mL (ref 2.3–4.2)

## 2020-10-13 LAB — T4, FREE: Free T4: 0.7 ng/dL (ref 0.60–1.60)

## 2020-10-14 ENCOUNTER — Encounter: Payer: Self-pay | Admitting: Psychology

## 2020-10-14 ENCOUNTER — Other Ambulatory Visit: Payer: Self-pay

## 2020-10-14 ENCOUNTER — Encounter: Payer: PRIVATE HEALTH INSURANCE | Attending: Psychology | Admitting: Psychology

## 2020-10-14 DIAGNOSIS — G9050 Complex regional pain syndrome I, unspecified: Secondary | ICD-10-CM

## 2020-10-14 DIAGNOSIS — F0781 Postconcussional syndrome: Secondary | ICD-10-CM | POA: Diagnosis present

## 2020-10-14 DIAGNOSIS — G43711 Chronic migraine without aura, intractable, with status migrainosus: Secondary | ICD-10-CM

## 2020-10-14 DIAGNOSIS — F4310 Post-traumatic stress disorder, unspecified: Secondary | ICD-10-CM | POA: Diagnosis present
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
## 2020-11-05 ENCOUNTER — Other Ambulatory Visit: Payer: Self-pay

## 2020-11-05 ENCOUNTER — Ambulatory Visit (INDEPENDENT_AMBULATORY_CARE_PROVIDER_SITE_OTHER): Payer: PRIVATE HEALTH INSURANCE | Admitting: Cardiovascular Disease

## 2020-11-05 DIAGNOSIS — R002 Palpitations: Secondary | ICD-10-CM

## 2020-11-05 DIAGNOSIS — I10 Essential (primary) hypertension: Secondary | ICD-10-CM

## 2020-11-05 MED ORDER — METOPROLOL SUCCINATE ER 25 MG PO TB24: 25.0000 mg | ORAL_TABLET | Freq: Every day | ORAL | 3 refills | Status: DC

## 2020-11-05 NOTE — Assessment & Plan Note (Signed)
Diastolic BP is poorly controlled.

## 2020-11-05 NOTE — Patient Instructions (Signed)
Medication Instructions:  START METOPROLOL SUCC 25 MG DAILY   *If you need a refill on your cardiac medications before your next appointment, please call your pharmacy*  Lab Work: NONE  Testing/Procedures: NONE   Follow-Up: At [facility], you and your health needs are our priority.  As part of our continuing mission to provide you with exceptional heart care, we have created designated Provider Care Teams.  These Care Teams include your primary Cardiologist (physician) and Advanced Practice Providers (APPs -  Physician Assistants and Nurse Practitioners) who all work together to provide you with the care you need, when you need it.  We recommend signing up for the patient portal called "MyChart".  Sign up information is provided on this After Visit Summary.  MyChart is used to connect with patients for Virtual Visits (Telemedicine).  Patients are able to view lab/test results, encounter notes, upcoming appointments, etc.  Non-urgent messages can be sent to your provider as well.   To learn more about what you can do with MyChart, go to [email or web address].    Your next appointment:   6 month(s)  The format for your next appointment:   In Person  Provider:   Skeet Latch, MD  Your physician recommends that you schedule a follow-up appointment in: [place] NP

## 2020-11-05 NOTE — Progress Notes (Signed)
u   Cardiology Office Note   Date:  11/06/2020   ID:  [name], DOB 1970-10-07, MRN [id number]  PCP:  Laurey Morale, MD  Cardiologist:   Skeet Latch, MD   No chief complaint on file.    History of Present Illness: [name] is a 50 y.o. female with PTSD, chronic migraines, prior PE and symptomatic anemia who is being seen today for follow up. She was first seen in 2019 for the evaluation of palpitations that started at the age of 66 or 7. She wore a monitor that showed rare PVCs and PACs. She followed up with [name] and was given metoprolol to take as needed. At the time she was anemic with a hemoglobin of 7.   Today, she has been feeling okay overall. Her anemia has improved following transfusions. Her heart continues to "skip, pound, and race" daily. In a typically day her palpitations occur multiple times and cause a need to catch her breath. Several times her heart races at 140-150 bpm, which resolves after taking deep inspirations. The pounding is noticeable while lying down at night. Of note, her palpitations are generally worse with exercise or while she is busy at work. Every now and then chest heaviness is occurring with the chest palpitations. She has also developed a new periodic cough associated with her palpitations. Recently she has not been taking metoprolol, and she does not remember if it helped her. Additionally, she endorses having dizzy spells on a daily basis. For activity, she is usually busy at work. She is able to walk further around her neighborhood, and tries to walk once every day. Sometimes she still feels a heaviness in her lower extremities while walking. She is unable to strength train due to a recent shoulder surgery. Her muscle weakness is worse within the past year. For her diet, she drinks 1 cup of coffee a day and otherwise only drinks water. She denies any headaches, syncope, orthopnea, or PND. Also has no lower extremity  edema.   Past Medical History:  Diagnosis Date   Anemia    iron deficiency    Blood transfusion without reported diagnosis    Cardiac arrhythmia    Cellulitis    Chronic headaches    Chronic lumbar pain since 2013   sees Dr. Arvilla Market    Clotting disorder [facility])    PE prior to pregnancy.   Complication of anesthesia    Bp drops and alot of shaking after anesthesia   COVID-19 03/01/2020   Interstitial cystitis    Palpitations 02/19/2018   PE (pulmonary embolism)    due to surgery   Pneumonia    PONV (postoperative nausea and vomiting)    Sepsis ([place])    due to surgery   Staph infection     Past Surgical History:  Procedure Laterality Date   BREAST REDUCTION SURGERY     COLONOSCOPY  06/25/2019   per Dr. Havery Moros, adeomatous polyps, repeat in 3 yrs    [place]     with left ovary and mass removed   RADIOFREQUENCY ABLATION NERVES     lumbar spine, per Dr. Ace Gins    ROBOTIC ASSISTED LAPAROSCOPIC HYSTERECTOMY AND SALPINGECTOMY Bilateral 04/14/2020   Procedure: XI ROBOTIC ASSISTED TOTAL LAPAROSCOPIC HYSTERECTOMY AND SALPINGECTOMY;  Surgeon: Princess Bruins, MD;  Location: [place];  Service: Gynecology;  Laterality: Bilateral;   SHOULDER ARTHROSCOPY WITH SUBACROMIAL DECOMPRESSION Right 09/14/2020   Procedure:  SHOULDER MANIPULATION UNDER ANESTHESIA, ARTHROSCOPIC LYSIS OF ADHESIONS AND SUBACROMIAL DECOMPRESSION;  Surgeon: Nicholes Stairs, MD;  Location: [place];  Service: Orthopedics;  Laterality: Right;  90MINS   TUBAL LIGATION     UPPER GASTROINTESTINAL ENDOSCOPY       Current Outpatient Medications  Medication Sig Dispense Refill   acetaminophen (TYLENOL) 500 MG tablet Take 1,000 mg by mouth every 6 (six) hours as needed for moderate pain or headache.     Carboxymethylcell-Hypromellose (GENTEAL OP) Place 1 drop into both eyes at bedtime.     cyanocobalamin (,VITAMIN B-12,) 1000 MCG/ML injection Inject 1 mL (1,000  mcg total) into the muscle once a week. 10 mL 11   cyclobenzaprine (FLEXERIL) 5 MG tablet Take 5 mg by mouth 3 (three) times daily as needed for muscle spasms.     HYDROcodone-acetaminophen (NORCO/VICODIN) 5-325 MG tablet Take 1 tablet by mouth every 6 (six) hours as needed for moderate pain.     ibuprofen (ADVIL) 800 MG tablet Take 800 mg by mouth every 6 (six) hours as needed for headache or moderate pain.     metoprolol succinate (TOPROL XL) 25 MG 24 hr tablet Take 1 tablet (25 mg total) by mouth daily. 90 tablet 3   ondansetron (ZOFRAN ODT) 4 MG disintegrating tablet Take 1 tablet (4 mg total) by mouth every 8 (eight) hours as needed. 20 tablet 0   SYRINGE-NEEDLE, DISP, 3 ML (LUER LOCK SAFETY SYRINGES) 25G X 1" 3 ML MISC 1 application by Does not apply route once a week. 50 each 3   Vitamin D, Ergocalciferol, 50000 units CAPS Take 1 capsule by mouth once a week. 12 capsule 3   zolpidem (AMBIEN) 10 MG tablet Take 1 tablet (10 mg total) by mouth at bedtime as needed. for sleep 90 tablet 1   No current facility-administered medications for this visit.    Allergies:   Patient has no known allergies.    Social History:  The patient  reports that she has quit smoking. Her smoking use included cigarettes. She has never used smokeless tobacco. She reports current alcohol use. She reports that she does not use drugs.   Family History:  The patient's family history includes COPD in her father; Cancer in her paternal aunt; Colon polyps in her maternal grandmother, mother, and paternal grandmother; Diabetes in her maternal grandfather, paternal grandfather, and sister; Heart Problems in her sister; Heart attack in her paternal grandfather and paternal grandmother; Heart defect in her mother and paternal grandmother; Heart disease in her maternal grandfather and paternal grandfather; Hypertension in her father, maternal grandfather, maternal grandmother, paternal grandfather, paternal grandmother, and  sister; Irritable bowel syndrome in her mother and paternal aunt; Liver disease in her father.    ROS:  Please see the history of present illness. (+) Palpitations (+) Shortness of breath (+) Chest Pain (+) Dizziness (+) Bilateral LE heaviness All other systems are reviewed and negative.    PHYSICAL EXAM: VS:  BP (!) 122/94   Pulse 68   Ht '5\' 4"'$  (1.626 m)   Wt 152 lb 12.8 oz (69.3 kg)   LMP 03/21/2020   BMI 26.23 kg/m  , BMI Body mass index is 26.23 kg/m. GENERAL:  Well appearing HEENT:  Pupils equal round and reactive, fundi not visualized, oral mucosa unremarkable NECK:  No jugular venous distention, waveform within normal limits, carotid upstroke brisk and symmetric, no bruits, no thyromegaly LYMPHATICS:  No cervical adenopathy LUNGS:  Clear to auscultation bilaterally HEART:  RRR.  PMI not displaced or sustained,S1 and S2 within normal limits, no S3, no S4, no clicks, no rubs, no murmurs ABD:  Flat, positive bowel sounds normal in frequency in pitch, no bruits, no rebound, no guarding, no midline pulsatile mass, no hepatomegaly, no splenomegaly EXT:  2 plus pulses throughout, no edema, no cyanosis no clubbing SKIN:  No rashes no nodules NEURO:  Cranial nerves II through XII grossly intact, motor grossly intact throughout PSYCH:  Cognitively intact, oriented to person place and time   EKG:   11/05/2020: Sinus arrhythmia. Rate 68 bpm. 02/12/2018: sinus rhythm.  Rate 83 bpm.  Short PR interval. ?delta wave  Echo 02/25/2020: 1. Left ventricular ejection fraction, by estimation, is 70 to 75%. The  left ventricle has hyperdynamic function. The left ventricle has no  regional wall motion abnormalities. Left ventricular diastolic parameters  are indeterminate.   2. Right ventricular systolic function is normal. The right ventricular  size is normal. There is normal pulmonary artery systolic pressure. The  estimated right ventricular systolic pressure is [id number] mmHg.   3. The  mitral valve is grossly normal. Mild mitral valve regurgitation.   4. The aortic valve is tricuspid. Aortic valve regurgitation is not  visualized.   5. The inferior vena cava is dilated in size with >50% respiratory  variability, suggesting right atrial pressure of 8 mmHg.   Monitor 02/21/2018: 7 Day Event Monitor   Quality: Fair.  Baseline artifact. Predominant rhythm: sinus  Average heart rate: 85 bpm Max heart rate: 152 bpm Min heart rate: 50 bpm   <1% PACs and PVCs noted  Recent Labs: 01/08/2020: B Natriuretic Peptide 30.3 10/12/2020: ALT 9; BUN 16; Creatinine, Ser 0.73; Hemoglobin 13.5; Platelets 272.0; Potassium 4.2; Sodium 135; TSH 1.78    Lipid Panel    Component Value Date/Time   CHOL 242 (H) 10/12/2020 1518   TRIG 42.0 10/12/2020 1518   HDL 87.50 10/12/2020 1518   CHOLHDL 3 10/12/2020 1518   VLDL 8.4 10/12/2020 1518   LDLCALC 146 (H) 10/12/2020 1518   LDLCALC 89 09/17/2019 1554     Wt Readings from Last 3 Encounters:  11/05/20 152 lb 12.8 oz (69.3 kg)  10/12/20 157 lb (71.2 kg)  09/14/20 155 lb (70.3 kg)      ASSESSMENT AND PLAN:  Essential hypertension Diastolic BP is poorly controlled.    Palpitations PACs noted on prior monitor.  It was worse when she was severely anemic.  We will retry metoprolol succinate 25 mg daily.  Thyroid was recently normal.  She is no longer anemic.  EKG today showed sinus arrhythmia and no significant abnormalities.  She has no evidence of heart failure or ischemic symptoms.  No additional testing indicated at this time.   Current medicines are reviewed at length with the patient today.  The patient does not have concerns regarding medicines.  The following changes have been made:  no change  Labs/ tests ordered today include:   Orders Placed This Encounter  Procedures   EKG 12-Lead    Disposition:    FU with APP in 2 months. FU with Rubi Tooley C. Oval Linsey, MD, [facility] in 6 months.  I,Mathew Stumpf,acting as a Education administrator for  Skeet Latch, MD.,have documented all relevant documentation on the behalf of Skeet Latch, MD,as directed by  Skeet Latch, MD while in the presence of Skeet Latch, MD.  I, [place] Oval Linsey, MD have reviewed all documentation for this visit.  The documentation of the exam, diagnosis, procedures, and orders on  11/06/2020 are all accurate and complete.  Time spent: 30 minutes-Greater than 50% of this time was spent in counseling, explanation of diagnosis, planning of further management, and coordination of care.   Signed, Branston Halsted C. Oval Linsey, MD, [facility]  11/06/2020 11:52 AM    [place]

## 2020-11-05 NOTE — Assessment & Plan Note (Addendum)
PACs noted on prior monitor.  It was worse when she was severely anemic.  We will retry metoprolol succinate 25 mg daily.  Thyroid was recently normal.  She is no longer anemic.  EKG today showed sinus arrhythmia and no significant abnormalities.  She has no evidence of heart failure or ischemic symptoms.  No additional testing indicated at this time.

## 2020-11-06 ENCOUNTER — Encounter (HOSPITAL_BASED_OUTPATIENT_CLINIC_OR_DEPARTMENT_OTHER): Payer: Self-pay | Admitting: Cardiovascular Disease

## 2020-12-28 ENCOUNTER — Encounter: Payer: PRIVATE HEALTH INSURANCE | Admitting: Psychology

## 2021-01-11 ENCOUNTER — Ambulatory Visit (HOSPITAL_BASED_OUTPATIENT_CLINIC_OR_DEPARTMENT_OTHER): Payer: PRIVATE HEALTH INSURANCE | Admitting: Family

## 2021-02-03 ENCOUNTER — Encounter: Payer: PRIVATE HEALTH INSURANCE | Attending: Psychology | Admitting: Psychology

## 2021-02-03 ENCOUNTER — Encounter: Payer: Self-pay | Admitting: Psychology

## 2021-02-03 ENCOUNTER — Other Ambulatory Visit: Payer: Self-pay

## 2021-02-03 DIAGNOSIS — F0781 Postconcussional syndrome: Secondary | ICD-10-CM | POA: Diagnosis not present

## 2021-02-03 DIAGNOSIS — G9050 Complex regional pain syndrome I, unspecified: Secondary | ICD-10-CM | POA: Insufficient documentation

## 2021-02-03 DIAGNOSIS — F4310 Post-traumatic stress disorder, unspecified: Secondary | ICD-10-CM | POA: Insufficient documentation

## 2021-02-03 DIAGNOSIS — G43711 Chronic migraine without aura, intractable, with status migrainosus: Secondary | ICD-10-CM | POA: Insufficient documentation

## 2021-02-03 NOTE — Progress Notes (Signed)
Cardiology Office Note:    Date:  02/04/2021   ID:  [name], DOB 1970-03-18, MRN [id number]  PCP:  Laurey Morale, MD   [facility] Providers Cardiologist:  Skeet Latch, MD     Referring MD: Laurey Morale, MD   Chief Complaint: palpitations  History of Present Illness:    [name] is a 50 y.o. female with a hx of HTN, migraine, anxiety, depression, palpitations, PE in 2000, and anemia. She established care with our group in December 2019 for evaluation of palpitations. She reported a history of palpitations for many years that worsened prior to her initial visit. Palpitations were associated with SOB, lightheadedness, and a weakness in her legs. She reported history of PE that occurred during hospitalization for breast reduction surgery that was complicated by sepsis in 2000. She was having heavy vaginal bleeding that required iron infusions and underwent hysterectomy in Feb 2022.   She was last seen in our office by Dr. Oval Linsey on 11/05/20 She was started on metoprolol succinate 25 mg daily for palpitations and 2 month f/u was recommended.   Today, she is here alone. She reports daily palpitations that do not interfere with her activity, but are noticeable. Took metoprolol for one week but did not like the thoughts of taking a beta-blocker. She is not certain that it made her feel more fatigued and admits that she should have given it more time. On two occasions since her last visit, she has had a feeling of irregular heart rate with pre-syncope. She reports she felt worsening palpitations and then a feeling like her heart was not going to beat for a few beats. She is not certain whether or not she was bearing down to try to abate the symptoms at the time. She did not pass out but felt like she was going to sink down to the ground but did not fall. Felt "off" and fatigued for about 24 hours after each of these events. Feels chest tightness or compression when laying on  her side in the bed, feels better sleeping on her back. Occasionally notes heart racing when getting ready to go to sleep. Has had lightheadedness and issues with her balance for many years, feels they have intensified recently. Feels like she is going to pass out if both of her hands go above her head. She is very aware of this and does not feel that it has changed recently. 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 She is not exercising regularly but is very active at work and at home. She denies chest pain, shortness of breath, lower extremity edema, fatigue, diaphoresis, weakness, orthopnea, and PND.   Past Medical History:  Diagnosis Date   Anemia    iron deficiency    Blood transfusion without reported diagnosis    Cardiac arrhythmia    Cellulitis    Chronic headaches    Chronic lumbar pain since 2013   sees Dr. Arvilla Market    Clotting disorder [facility])    PE prior to pregnancy.   Complication of anesthesia    Bp drops and alot of shaking after anesthesia   COVID-19 03/01/2020   Interstitial cystitis    Palpitations 02/19/2018   PE (pulmonary embolism)    due to surgery   Pneumonia    PONV (postoperative nausea and vomiting)    Sepsis ([place])    due to surgery   Staph infection     Past Surgical History:  Procedure Laterality Date   BREAST REDUCTION SURGERY     COLONOSCOPY  06/25/2019   per Dr. Havery Moros, adeomatous polyps, repeat in 3 yrs    [place]     with left ovary and mass removed   RADIOFREQUENCY ABLATION NERVES     lumbar spine, per Dr. Ace Gins    ROBOTIC ASSISTED LAPAROSCOPIC HYSTERECTOMY AND SALPINGECTOMY Bilateral 04/14/2020   Procedure: XI ROBOTIC ASSISTED TOTAL LAPAROSCOPIC HYSTERECTOMY AND SALPINGECTOMY;  Surgeon: Princess Bruins, MD;  Location: [facility];  Service: Gynecology;  Laterality: Bilateral;   SHOULDER ARTHROSCOPY WITH SUBACROMIAL DECOMPRESSION Right 09/14/2020   Procedure: SHOULDER  MANIPULATION UNDER ANESTHESIA, ARTHROSCOPIC LYSIS OF ADHESIONS AND SUBACROMIAL DECOMPRESSION;  Surgeon: Nicholes Stairs, MD;  Location: [facility];  Service: Orthopedics;  Laterality: Right;  90MINS   TUBAL LIGATION     UPPER GASTROINTESTINAL ENDOSCOPY      Current Medications: Current Meds  Medication Sig   acetaminophen (TYLENOL) 500 MG tablet Take 1,000 mg by mouth every 6 (six) hours as needed for moderate pain or headache.   Carboxymethylcell-Hypromellose (GENTEAL OP) Place 1 drop into both eyes at bedtime.   cyanocobalamin (,VITAMIN B-12,) 1000 MCG/ML injection Inject 1 mL (1,000 mcg total) into the muscle once a week.   cyclobenzaprine (FLEXERIL) 5 MG tablet Take 5 mg by mouth 3 (three) times daily as needed for muscle spasms.   gabapentin (NEURONTIN) 300 MG capsule Take 300 mg by mouth daily.   HYDROcodone-acetaminophen (NORCO/VICODIN) 5-325 MG tablet Take 1 tablet by mouth every 6 (six) hours as needed for moderate pain.   ibuprofen (ADVIL) 800 MG tablet Take 800 mg by mouth every 6 (six) hours as needed for headache or moderate pain.   ondansetron (ZOFRAN ODT) 4 MG disintegrating tablet Take 1 tablet (4 mg total) by mouth every 8 (eight) hours as needed.   SYRINGE-NEEDLE, DISP, 3 ML (LUER LOCK SAFETY SYRINGES) 25G X 1" 3 ML MISC 1 application by Does not apply route once a week.   Vitamin D, Ergocalciferol, 50000 units CAPS Take 1 capsule by mouth once a week.   zolpidem (AMBIEN) 10 MG tablet Take 1 tablet (10 mg total) by mouth at bedtime as needed. for sleep     Allergies:   Patient has no known allergies.   Social History   Socioeconomic History   Marital status: Married    Spouse name: Not on file   Number of children: 3   Years of education: Not on file   Highest education level: Not on file  Occupational History   Occupation: Glass blower/designer  Tobacco Use   Smoking status: Former    Years: 1.00    Types: Cigarettes   Smokeless tobacco: Never   Tobacco comments:     age 71  Vaping Use   Vaping Use: Never used  Substance and Sexual Activity   Alcohol use: Yes    Alcohol/week: 0.0 standard drinks    Comment: glass of wine occasionally   Drug use: No   Sexual activity: Yes    Partners: Male    Birth control/protection: None    Comment: Tubal ligation  Other Topics Concern   Not on file  Social History Narrative   Lives at home with husband and 2 children   Right handed   Caffeine: 1-2 cups of coffee per day   Social Determinants of Health   Financial Resource Strain: Not on file  Food Insecurity: Not on file  Transportation Needs: Not on file  Physical Activity: Not on file  Stress: Not on file  Social Connections: Not on file     Family History: The patient's family history includes COPD in her father; Cancer in her paternal aunt; Colon polyps in her maternal grandmother, mother, and paternal grandmother; Diabetes in her maternal grandfather, paternal grandfather, and sister; Heart Problems in her sister; Heart attack in her paternal grandfather and paternal grandmother; Heart defect in her mother and paternal grandmother; Heart disease in her maternal grandfather and paternal grandfather; Hypertension in her father, maternal grandfather, maternal grandmother, paternal grandfather, paternal grandmother, and sister; Irritable bowel syndrome in her mother and paternal aunt; Liver disease in her father. There is no history of Colon cancer, Esophageal cancer, Rectal cancer, or Stomach cancer.  ROS:   Please see the history of present illness.  All other systems reviewed and are negative.  Labs/Other Studies Reviewed:    The following studies were reviewed today:  Echo 02/25/20  Left Ventricle: Left ventricular ejection fraction, by estimation, is 70  to 75%. The left ventricle has hyperdynamic function. The left ventricle  has no regional wall motion abnormalities. The left ventricular internal  cavity size was normal in size. There is no  left ventricular hypertrophy. Left ventricular diastolic parameters are indeterminate.  Right Ventricle: The right ventricular size is normal. No increase in  right ventricular wall thickness. Right ventricular systolic function is  normal. There is normal pulmonary artery systolic pressure. The tricuspid regurgitant velocity is 2.39 m/s, and  with an assumed right atrial pressure of 8 mmHg, the estimated right ventricular systolic pressure is 54.0 mmHg.  Left Atrium: Left atrial size was normal in size.  Right Atrium: Right atrial size was normal in size.  Pericardium: There is no evidence of pericardial effusion.  Mitral Valve: The mitral valve is grossly normal. Mild mitral valve  regurgitation.  Tricuspid Valve: The tricuspid valve is grossly normal. Tricuspid valve  regurgitation is mild.  Aortic Valve: The aortic valve is tricuspid. Aortic valve regurgitation is  not visualized.  Pulmonic Valve: The pulmonic valve was grossly normal. Pulmonic valve regurgitation is trivial.  Aorta: The aortic root is normal in size and structure.  Venous: The inferior vena cava is dilated in size with greater than 50%  respiratory variability, suggesting right atrial pressure of 8 mmHg.  IAS/Shunts: No atrial level shunt detected    Cardiac monitor 03/10/18  7 Day Event Monitor   Quality: Fair.  Baseline artifact. Predominant rhythm: sinus  Average heart rate: 85 bpm Max heart rate: 152 bpm Min heart rate: 50 bpm   <1% PACs and PVCs noted  Recent Labs: 10/12/2020: ALT 9; BUN 16; Creatinine, Ser 0.73; Hemoglobin 13.5; Platelets 272.0; Potassium 4.2; Sodium 135; TSH 1.78   Recent Lipid Panel    Component Value Date/Time   CHOL 242 (H) 10/12/2020 1518   TRIG 42.0 10/12/2020 1518   HDL 87.50 10/12/2020 1518   CHOLHDL 3 10/12/2020 1518   VLDL 8.4 10/12/2020 1518   LDLCALC 146 (H) 10/12/2020 1518   LDLCALC 89 09/17/2019 1554    Physical Exam:    VS:  BP 116/86 (BP Location: Left Arm,  Patient Position: Sitting, Cuff Size: Normal)   Pulse 75   Ht 5\' 4"  (1.626 m)   Wt 153 lb 4.8 oz (69.5 kg)   LMP 03/21/2020   SpO2 99%   BMI 26.31 kg/m     Wt Readings from Last 3 Encounters:  02/04/21 153 lb  4.8 oz (69.5 kg)  11/05/20 152 lb 12.8 oz (69.3 kg)  10/12/20 157 lb (71.2 kg)     GEN: Well nourished, well developed in no acute distress HEENT: Normal NECK: No JVD; No carotid bruits LYMPHATICS: No lymphadenopathy CARDIAC: RRR, no murmurs, rubs, gallops RESPIRATORY:  Clear to auscultation without rales, wheezing or rhonchi  ABDOMEN: Soft, non-tender, non-distended MUSCULOSKELETAL:  No edema; No deformity  SKIN: Warm and dry NEUROLOGIC:  Alert and oriented x 3 PSYCHIATRIC:  Normal affect   EKG:  EKG is not ordered today.    Diagnoses:    1. Palpitations   2. Pre-syncope    Assessment and Plan:     Palpitations: She is having increasing symptoms of palpitations. Frequency of mild palpitations is daily. She has had 2 more concerning episodes randomly. We will start with a 14 day Zio monitor and she is agreeable to discuss extending the monitor if deemed necessary. She has EKG on her Apple Watch and will try to capture future events as well as wearing cardiac monitor. We discussed use of prn propranolol or other beta-blockers. She will restart metoprolol 12.5 mg at night to see if she can tolerate and let us know how she feels on this dose.   Pre-syncope: On two occasions, had worsening palpitations that made her feel as if she might pass out. She did not fall or lose consciousness. Will get 14-day monitor. Advised her to call back if symptoms worsen.   Disposition: 14 day monitor. Follow-up in 3 months with Dr. Oval Linsey or APP  Medication Adjustments/Labs and Tests Ordered: Current medicines are reviewed at length with the patient today.  Concerns regarding medicines are outlined above.  Orders Placed This Encounter  Procedures   LONG TERM MONITOR (3-14 DAYS)     No orders of the defined types were placed in this encounter.   Patient Instructions  Medication Instructions:  Your physician has recommended you make the following change in your medication:   Resume: Metoprolol 12.5 mg (half tablet) at bedtime   *If you need a refill on your cardiac medications before your next appointment, please call your pharmacy*   Lab Work: None ordered today   Testing/Procedures: Your physician has recommended that you wear a Zio monitor.   This monitor is a medical device that records the heart's electrical activity. Doctors most often use these monitors to diagnose arrhythmias. Arrhythmias are problems with the speed or rhythm of the heartbeat. The monitor is a small device applied to your chest. You can wear one while you do your normal daily activities. While wearing this monitor if you have any symptoms to push the button and record what you felt. Once you have worn this monitor for the period of time provider prescribed (Usually 14 days), you will return the monitor device in the postage paid box. Once it is returned they will download the data collected and provide [place] with a report which the provider will then review and we will call you with those results. Important tips:  Avoid showering during the first 24 hours of wearing the monitor. Avoid excessive sweating to help maximize wear time. Do not submerge the device, no hot tubs, and no swimming pools. Keep any lotions or oils away from the patch. After 24 hours you may shower with the patch on. Take brief showers with your back facing the shower head.  Do not remove patch once it has been placed because that will interrupt data and decrease adhesive wear time.  Push the button when you have any symptoms and write down what you were feeling. Once you have completed wearing your monitor, remove and place into box which has postage paid and place in your outgoing mailbox.  If for some reason you have  misplaced your box then call our office and we can provide another box and/or mail it off for you.      Follow-Up: At [facility], you and your health needs are our priority.  As part of our continuing mission to provide you with exceptional heart care, we have created designated Provider Care Teams.  These Care Teams include your primary Cardiologist (physician) and Advanced Practice Providers (APPs -  Physician Assistants and Nurse Practitioners) who all work together to provide you with the care you need, when you need it.  We recommend signing up for the patient portal called "MyChart".  Sign up information is provided on this After Visit Summary.  MyChart is used to connect with patients for Virtual Visits (Telemedicine).  Patients are able to view lab/test results, encounter notes, upcoming appointments, etc.  Non-urgent messages can be sent to your provider as well.   To learn more about what you can do with MyChart, go to [email or web address].    Your next appointment:   2-3 month(s)  The format for your next appointment:   In Person  Provider:   Skeet Latch, MD, Laurann Montana, NP, or Coletta Memos, NP    Other Instructions None today    Signed, Emmaline Life, NP  02/04/2021 4:46 PM    [place]
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
## 2021-02-04 ENCOUNTER — Ambulatory Visit (INDEPENDENT_AMBULATORY_CARE_PROVIDER_SITE_OTHER): Payer: PRIVATE HEALTH INSURANCE

## 2021-02-04 ENCOUNTER — Encounter (HOSPITAL_BASED_OUTPATIENT_CLINIC_OR_DEPARTMENT_OTHER): Payer: Self-pay | Admitting: Nurse Practitioner

## 2021-02-04 ENCOUNTER — Ambulatory Visit (INDEPENDENT_AMBULATORY_CARE_PROVIDER_SITE_OTHER): Payer: PRIVATE HEALTH INSURANCE | Admitting: Nurse Practitioner

## 2021-02-04 VITALS — BP 116/86 | HR 75 | Ht 64.0 in | Wt 153.3 lb

## 2021-02-04 DIAGNOSIS — R002 Palpitations: Secondary | ICD-10-CM

## 2021-02-04 DIAGNOSIS — R55 Syncope and collapse: Secondary | ICD-10-CM | POA: Diagnosis not present

## 2021-02-04 NOTE — Patient Instructions (Signed)
Medication Instructions:  Your physician has recommended you make the following change in your medication:   Resume: Metoprolol 12.5 mg (half tablet) at bedtime   *If you need a refill on your cardiac medications before your next appointment, please call your pharmacy*   Lab Work: None ordered today   Testing/Procedures: Your physician has recommended that you wear a Zio monitor.   This monitor is a medical device that records the heart's electrical activity. Doctors most often use these monitors to diagnose arrhythmias. Arrhythmias are problems with the speed or rhythm of the heartbeat. The monitor is a small device applied to your chest. You can wear one while you do your normal daily activities. While wearing this monitor if you have any symptoms to push the button and record what you felt. Once you have worn this monitor for the period of time provider prescribed (Usually 14 days), you will return the monitor device in the postage paid box. Once it is returned they will download the data collected and provide [place] with a report which the provider will then review and we will call you with those results. Important tips:  Avoid showering during the first 24 hours of wearing the monitor. Avoid excessive sweating to help maximize wear time. Do not submerge the device, no hot tubs, and no swimming pools. Keep any lotions or oils away from the patch. After 24 hours you may shower with the patch on. Take brief showers with your back facing the shower head.  Do not remove patch once it has been placed because that will interrupt data and decrease adhesive wear time. Push the button when you have any symptoms and write down what you were feeling. Once you have completed wearing your monitor, remove and place into box which has postage paid and place in your outgoing mailbox.  If for some reason you have misplaced your box then call our office and we can provide another box and/or mail it off for  you.      Follow-Up: At [facility], you and your health needs are our priority.  As part of our continuing mission to provide you with exceptional heart care, we have created designated Provider Care Teams.  These Care Teams include your primary Cardiologist (physician) and Advanced Practice Providers (APPs -  Physician Assistants and Nurse Practitioners) who all work together to provide you with the care you need, when you need it.  We recommend signing up for the patient portal called "MyChart".  Sign up information is provided on this After Visit Summary.  MyChart is used to connect with patients for Virtual Visits (Telemedicine).  Patients are able to view lab/test results, encounter notes, upcoming appointments, etc.  Non-urgent messages can be sent to your provider as well.   To learn more about what you can do with MyChart, go to [email or web address].    Your next appointment:   2-3 month(s)  The format for your next appointment:   In Person  Provider:   Skeet Latch, MD, Laurann Montana, NP, or Coletta Memos, NP    Other Instructions None today

## 2021-03-02 ENCOUNTER — Telehealth: Payer: Self-pay | Admitting: Hematology and Oncology

## 2021-03-02 NOTE — Telephone Encounter (Signed)
Scheduled appointment per provider. Patient aware.  

## 2021-03-08 ENCOUNTER — Ambulatory Visit: Payer: PRIVATE HEALTH INSURANCE | Admitting: Hematology and Oncology

## 2021-03-08 ENCOUNTER — Inpatient Hospital Stay: Payer: PRIVATE HEALTH INSURANCE | Admitting: Hematology and Oncology

## 2021-03-14 ENCOUNTER — Encounter: Payer: PRIVATE HEALTH INSURANCE | Admitting: Psychology

## 2021-03-15 ENCOUNTER — Other Ambulatory Visit: Payer: Self-pay

## 2021-03-15 ENCOUNTER — Encounter: Payer: Self-pay | Admitting: Hematology and Oncology

## 2021-03-15 ENCOUNTER — Inpatient Hospital Stay: Payer: PRIVATE HEALTH INSURANCE | Attending: Hematology and Oncology | Admitting: Hematology and Oncology

## 2021-03-15 ENCOUNTER — Inpatient Hospital Stay: Payer: PRIVATE HEALTH INSURANCE

## 2021-03-15 VITALS — BP 139/81 | HR 63 | Temp 98.4°F | Resp 16 | Ht 64.0 in | Wt 154.6 lb

## 2021-03-15 DIAGNOSIS — D500 Iron deficiency anemia secondary to blood loss (chronic): Secondary | ICD-10-CM

## 2021-03-15 DIAGNOSIS — Z90710 Acquired absence of both cervix and uterus: Secondary | ICD-10-CM | POA: Insufficient documentation

## 2021-03-15 DIAGNOSIS — E538 Deficiency of other specified B group vitamins: Secondary | ICD-10-CM

## 2021-03-15 LAB — CBC WITH DIFFERENTIAL/PLATELET
Abs Immature Granulocytes: 0.01 10*3/uL (ref 0.00–0.07)
Basophils Absolute: 0.1 10*3/uL (ref 0.0–0.1)
Basophils Relative: 1 %
Eosinophils Absolute: 0.1 10*3/uL (ref 0.0–0.5)
Eosinophils Relative: 1 %
HCT: 40.6 % (ref 36.0–46.0)
Hemoglobin: 13.4 g/dL (ref 12.0–15.0)
Immature Granulocytes: 0 %
Lymphocytes Relative: 27 %
Lymphs Abs: 1.5 10*3/uL (ref 0.7–4.0)
MCH: 30.6 pg (ref 26.0–34.0)
MCHC: 33 g/dL (ref 30.0–36.0)
MCV: 92.7 fL (ref 80.0–100.0)
Monocytes Absolute: 0.4 10*3/uL (ref 0.1–1.0)
Monocytes Relative: 8 %
Neutro Abs: 3.6 10*3/uL (ref 1.7–7.7)
Neutrophils Relative %: 63 %
Platelets: 334 10*3/uL (ref 150–400)
RBC: 4.38 MIL/uL (ref 3.87–5.11)
RDW: 12.5 % (ref 11.5–15.5)
WBC: 5.7 10*3/uL (ref 4.0–10.5)
nRBC: 0 % (ref 0.0–0.2)

## 2021-03-15 LAB — IRON AND IRON BINDING CAPACITY (CC-WL,HP ONLY)
Iron: 72 ug/dL (ref 28–170)
Saturation Ratios: 22 % (ref 10.4–31.8)
TIBC: 329 ug/dL (ref 250–450)
UIBC: 257 ug/dL (ref 148–442)

## 2021-03-15 LAB — VITAMIN B12: Vitamin B-12: 1645 pg/mL — ABNORMAL HIGH (ref 180–914)

## 2021-03-15 NOTE — Progress Notes (Signed)
[facility] NOTE  Patient Care Team: Laurey Morale, MD as PCP - General Skeet Latch, MD as PCP - Cardiology (Cardiology) Skeet Latch, MD as Consulting Physician (Cardiology)  CHIEF COMPLAINTS/PURPOSE OF CONSULTATION:  IDA  ASSESSMENT & PLAN:   This is a very pleasant 51 year old female patient with severe anemia likely secondary to menstrual blood loss, had hysterectomy and required intravenous iron infusions, last iron infusion in March 2022 returns for follow-up.  She has been doing remarkably well, no major concerns today. Physical examination unremarkable.   CBC and iron panel look well without any concerns for iron deficiency anemia, ferritin pending. She will return to clinic in 6 months with repeat labs. Results conveyed to her via patient message.  HISTORY OF PRESENTING ILLNESS:   [name] 51 y.o. female is here because of severe anemia disproportionate to the menstrual blood loss.  This is a very pleasant 51 year old female patient, excellent historian referred to hematology for recommendations regarding persistent and severe iron deficiency anemia.   Interim History  Since last visit, she feels remarkably well. Fatigue is much more tolerable, she is able to do much more.  She has been craving a little bit of ice but not nearly the same.  She is still dealing with her tachybrady syndrome, monitoring it with cardiology, she has not tolerated metoprolol well hence she discontinued it.  Rest of the pertinent ros reviewed and neg  REVIEW OF SYSTEMS:   Constitutional: Denies fevers, chills or abnormal night sweats.  Rest as mentioned in HPI Eyes: Denies blurriness of vision, double vision or watery eyes Ears, nose, mouth, throat, and face: Denies mucositis or sore throat Respiratory: Denies cough, dyspnea or wheezes Cardiovascular: Complains of palpitations, vague chest pain on the left side Gastrointestinal:  Denies nausea,  heartburn or change in bowel habits Skin: Denies abnormal skin rashes Lymphatics: Denies new lymphadenopathy or easy bruising Neurological:Denies numbness, tingling or new weaknesses Behavioral/Psych: Mood is stable, no new changes  All other systems were reviewed with the patient and are negative.  MEDICAL HISTORY:  Past Medical History:  Diagnosis Date   Anemia    iron deficiency    Blood transfusion without reported diagnosis    Cardiac arrhythmia    Cellulitis    Chronic headaches    Chronic lumbar pain since 2013   sees Dr. Arvilla Market    Clotting disorder [facility])    PE prior to pregnancy.   Complication of anesthesia    Bp drops and alot of shaking after anesthesia   COVID-19 03/01/2020   Interstitial cystitis    Palpitations 02/19/2018   PE (pulmonary embolism)    due to surgery   Pneumonia    PONV (postoperative nausea and vomiting)    Sepsis ([place])    due to surgery   Staph infection     SURGICAL HISTORY: Past Surgical History:  Procedure Laterality Date   BREAST REDUCTION SURGERY     COLONOSCOPY  06/25/2019   per Dr. Havery Moros, adeomatous polyps, repeat in 3 yrs    [place]     with left ovary and mass removed   RADIOFREQUENCY ABLATION NERVES     lumbar spine, per Dr. Ace Gins    ROBOTIC ASSISTED LAPAROSCOPIC HYSTERECTOMY AND SALPINGECTOMY Bilateral 04/14/2020   Procedure: XI ROBOTIC ASSISTED TOTAL LAPAROSCOPIC HYSTERECTOMY AND SALPINGECTOMY;  Surgeon: Princess Bruins, MD;  Location: [facility];  Service: Gynecology;  Laterality: Bilateral;   SHOULDER  ARTHROSCOPY WITH SUBACROMIAL DECOMPRESSION Right 09/14/2020   Procedure: SHOULDER MANIPULATION UNDER ANESTHESIA, ARTHROSCOPIC LYSIS OF ADHESIONS AND SUBACROMIAL DECOMPRESSION;  Surgeon: Nicholes Stairs, MD;  Location: [facility];  Service: Orthopedics;  Laterality: Right;  90MINS   TUBAL LIGATION     UPPER GASTROINTESTINAL ENDOSCOPY      SOCIAL HISTORY: Social  History   Socioeconomic History   Marital status: Married    Spouse name: Not on file   Number of children: 3   Years of education: Not on file   Highest education level: Not on file  Occupational History   Occupation: Glass blower/designer  Tobacco Use   Smoking status: Former    Years: 1.00    Types: Cigarettes   Smokeless tobacco: Never   Tobacco comments:    age 53  Vaping Use   Vaping Use: Never used  Substance and Sexual Activity   Alcohol use: Yes    Alcohol/week: 0.0 standard drinks    Comment: glass of wine occasionally   Drug use: No   Sexual activity: Yes    Partners: Male    Birth control/protection: None    Comment: Tubal ligation  Other Topics Concern   Not on file  Social History Narrative   Lives at home with husband and 2 children   Right handed   Caffeine: 1-2 cups of coffee per day   Social Determinants of Health   Financial Resource Strain: Not on file  Food Insecurity: Not on file  Transportation Needs: Not on file  Physical Activity: Not on file  Stress: Not on file  Social Connections: Not on file  Intimate Partner Violence: Not on file    FAMILY HISTORY: Family History  Problem Relation Age of Onset   Colon polyps Mother    Irritable bowel syndrome Mother    Heart defect Mother        tachycardia   Colon polyps Paternal Grandmother    Hypertension Paternal Grandmother    Heart defect Paternal Grandmother    Heart attack Paternal Grandmother    Colon polyps Maternal Grandmother    Hypertension Maternal Grandmother    Diabetes Paternal Grandfather    Heart disease Paternal Grandfather    Hypertension Paternal Grandfather    Heart attack Paternal Grandfather    Diabetes Maternal Grandfather    Heart disease Maternal Grandfather    Hypertension Maternal Grandfather    Diabetes Sister    Hypertension Sister    Heart Problems Sister    Irritable bowel syndrome Paternal Aunt    Cancer Paternal Aunt        melanoma   Liver disease  Father    Hypertension Father    COPD Father    Colon cancer Neg Hx    Esophageal cancer Neg Hx    Rectal cancer Neg Hx    Stomach cancer Neg Hx     ALLERGIES:  has No Known Allergies.  MEDICATIONS:  Current Outpatient Medications  Medication Sig Dispense Refill   acetaminophen (TYLENOL) 500 MG tablet Take 1,000 mg by mouth every 6 (six) hours as needed for moderate pain or headache.     Carboxymethylcell-Hypromellose (GENTEAL OP) Place 1 drop into both eyes at bedtime.     cyanocobalamin (,VITAMIN B-12,) 1000 MCG/ML injection Inject 1 mL (1,000 mcg total) into the muscle once a week. 10 mL 11   cyclobenzaprine (FLEXERIL) 5 MG tablet Take 5 mg by mouth 3 (three) times daily as needed for muscle spasms.     gabapentin (  NEURONTIN) 300 MG capsule Take 300 mg by mouth daily.     HYDROcodone-acetaminophen (NORCO/VICODIN) 5-325 MG tablet Take 1 tablet by mouth every 6 (six) hours as needed for moderate pain.     ibuprofen (ADVIL) 800 MG tablet Take 800 mg by mouth every 6 (six) hours as needed for headache or moderate pain.     ondansetron (ZOFRAN ODT) 4 MG disintegrating tablet Take 1 tablet (4 mg total) by mouth every 8 (eight) hours as needed. 20 tablet 0   SYRINGE-NEEDLE, DISP, 3 ML (LUER LOCK SAFETY SYRINGES) 25G X 1" 3 ML MISC 1 application by Does not apply route once a week. 50 each 3   Vitamin D, Ergocalciferol, 50000 units CAPS Take 1 capsule by mouth once a week. 12 capsule 3   zolpidem (AMBIEN) 10 MG tablet Take 1 tablet (10 mg total) by mouth at bedtime as needed. for sleep 90 tablet 1   No current facility-administered medications for this visit.    PHYSICAL EXAMINATION:  ECOG PERFORMANCE STATUS: 2 - Symptomatic, <50% confined to bed  Vitals:   03/15/21 1529  BP: 139/81  Pulse: 63  Resp: 16  Temp: 98.4 F (36.9 C)  SpO2: 100%   Filed Weights   03/15/21 1529  Weight: 154 lb 9.6 oz (70.1 kg)   GENERAL:alert, no distress and comfortable SKIN: skin color, texture,  turgor are normal, no rashes or significant lesions EYES: normal, conjunctiva are pale, sclera clear.  No icterus OROPHARYNX:no exudate, no erythema and lips, buccal mucosa, and tongue normal  NECK: supple, thyroid normal size, non-tender, without nodularity LYMPH:  no palpable lymphadenopathy in the cervical, axillary  LUNGS: clear to auscultation and percussion with normal breathing effort HEART: regular rate & rhythm  ABDOMEN:abdomen soft, non-tender and normal bowel sounds Musculoskeletal:no cyanosis of digits and no clubbing.  No bilateral lower extremity edema PSYCH: alert & oriented x 3 with fluent speech NEURO: no focal motor/sensory deficits  LABORATORY DATA:  I have reviewed the data as listed Lab Results  Component Value Date   WBC 5.7 03/15/2021   HGB 13.4 03/15/2021   HCT 40.6 03/15/2021   MCV 92.7 03/15/2021   PLT 334 03/15/2021     Chemistry      Component Value Date/Time   NA 135 10/12/2020 1518   K 4.2 10/12/2020 1518   CL 101 10/12/2020 1518   CO2 24 10/12/2020 1518   BUN 16 10/12/2020 1518   CREATININE 0.73 10/12/2020 1518   CREATININE 0.64 09/17/2019 1554      Component Value Date/Time   CALCIUM 9.6 10/12/2020 1518   ALKPHOS 44 10/12/2020 1518   AST 11 10/12/2020 1518   ALT 9 10/12/2020 1518   BILITOT 0.5 10/12/2020 1518      RADIOGRAPHIC STUDIES: I have personally reviewed the radiological images as listed and agreed with the findings in the report. No results found. Labs from today reviewed, CBC and iron panel unremarkable.  Ferritin pending  Time  I spent a total of 20 minutes in the care of this patient including history and physical, review of medical records, counseling and coordination of care for    Benay Pike, MD 03/15/2021 5:49 PM

## 2021-03-16 LAB — FERRITIN: Ferritin: 60 ng/mL (ref 11–307)

## 2021-04-10 ENCOUNTER — Other Ambulatory Visit: Payer: Self-pay | Admitting: Family Medicine

## 2021-04-11 ENCOUNTER — Encounter: Payer: PRIVATE HEALTH INSURANCE | Attending: Psychology | Admitting: Psychology

## 2021-04-11 ENCOUNTER — Other Ambulatory Visit: Payer: Self-pay

## 2021-04-11 DIAGNOSIS — G43711 Chronic migraine without aura, intractable, with status migrainosus: Secondary | ICD-10-CM | POA: Diagnosis not present

## 2021-04-11 DIAGNOSIS — F0781 Postconcussional syndrome: Secondary | ICD-10-CM | POA: Insufficient documentation

## 2021-04-11 NOTE — Telephone Encounter (Signed)
Pt LOV was on 10/12/2020 Last refill done on 10/12/2020 Please advise

## 2021-04-14 ENCOUNTER — Encounter: Payer: Self-pay | Admitting: Psychology
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
## 2021-05-16 ENCOUNTER — Ambulatory Visit (INDEPENDENT_AMBULATORY_CARE_PROVIDER_SITE_OTHER): Payer: PRIVATE HEALTH INSURANCE | Admitting: Cardiovascular Disease

## 2021-05-16 ENCOUNTER — Other Ambulatory Visit: Payer: Self-pay

## 2021-05-16 ENCOUNTER — Encounter (HOSPITAL_BASED_OUTPATIENT_CLINIC_OR_DEPARTMENT_OTHER): Payer: Self-pay | Admitting: Cardiovascular Disease

## 2021-05-16 DIAGNOSIS — I499 Cardiac arrhythmia, unspecified: Secondary | ICD-10-CM

## 2021-05-16 DIAGNOSIS — I10 Essential (primary) hypertension: Secondary | ICD-10-CM

## 2021-05-16 DIAGNOSIS — I471 Supraventricular tachycardia: Secondary | ICD-10-CM

## 2021-05-16 HISTORY — DX: Supraventricular tachycardia: I47.1

## 2021-05-16 HISTORY — DX: Cardiac arrhythmia, unspecified: I49.9

## 2021-05-16 MED ORDER — FLECAINIDE ACETATE 50 MG PO TABS: 50.0000 mg | ORAL_TABLET | Freq: Two times a day (BID) | ORAL | 1 refills | Status: DC

## 2021-05-16 NOTE — Progress Notes (Signed)
Cardiology Office Note  Date:  05/16/2021   ID:  [name], DOB 10-26-1970, MRN [id number]  PCP:  Laurey Morale, MD  Cardiologist:   Skeet Latch, MD   No chief complaint on file.    History of Present Illness: [name] is a 51 y.o. female with PTSD, chronic migraines, prior PE and symptomatic anemia who is being seen today for follow up. She was first seen in 2019 for the evaluation of palpitations that started at the age of 68 or 53. She wore a monitor that showed rare PVCs and PACs. She followed up with [name] and was given metoprolol to take as needed. At the time she was anemic with a hemoglobin of 7.   At follow-up she was feeling better but continued to have daily palpitations. At times her heart raced to the 140s-150s. She was restarted on metoprolol. She followed up with Christen Bame, NP and had stopped taking her metoprolol because she didn't like the thought of being on a beta blocker. She continued to feel palpitations. She had a monitor that showed rare PACs and PVCs with up to 8 beat runs of SVT.  Today, she is feeling okay overall. At home her blood pressure is generally controlled. She complains of daily palpitations including episodes of racing, sluggish, and/or "skippy" heart beats. Her racing heart beats elevate up to 160 bpm. However, she always recovers quickly within a few minutes by resting and deep inspiration. Typically her episodes occur when she is not exercising, but she still feels the "adrenaline dump." Her "pounding" heart beats have been rarer, and associated more with being out of rhythm. When she is arrhythmic she feels a little dizzy and lightheaded. She also endorses regular episodic dizzy spells. Also, she believes metoprolol is causing her to feel sluggish. Lately she seems to be coughing more frequently, which she believes is almost instinctually related to her palpitations. She has tried taking electrolyte powder supplements added  to her water, with no significant differences. She denies any chest pain, shortness of breath, or peripheral edema. No headaches, syncope, orthopnea, or PND.   Past Medical History:  Diagnosis Date   Anemia    iron deficiency    Blood transfusion without reported diagnosis    Cardiac arrhythmia    Cellulitis    Chronic headaches    Chronic lumbar pain since 2013   sees Dr. Arvilla Market    Clotting disorder [facility])    PE prior to pregnancy.   Complication of anesthesia    Bp drops and alot of shaking after anesthesia   COVID-19 03/01/2020   Interstitial cystitis    Palpitations 02/19/2018   PE (pulmonary embolism)    due to surgery   Pneumonia    PONV (postoperative nausea and vomiting)    Sepsis ([place])    due to surgery   Staph infection    SVT (supraventricular tachycardia) ([place]) 05/16/2021    Past Surgical History:  Procedure Laterality Date   BREAST REDUCTION SURGERY     COLONOSCOPY  06/25/2019   per Dr. Havery Moros, adeomatous polyps, repeat in 3 yrs    [facility]     with left ovary and mass removed   RADIOFREQUENCY ABLATION NERVES     lumbar spine, per Dr. Ace Gins    ROBOTIC ASSISTED LAPAROSCOPIC HYSTERECTOMY AND SALPINGECTOMY Bilateral 04/14/2020   Procedure: XI ROBOTIC ASSISTED TOTAL LAPAROSCOPIC HYSTERECTOMY AND SALPINGECTOMY;  Surgeon: Princess Bruins,  MD;  Location: [facility];  Service: Gynecology;  Laterality: Bilateral;   SHOULDER ARTHROSCOPY WITH SUBACROMIAL DECOMPRESSION Right 09/14/2020   Procedure: SHOULDER MANIPULATION UNDER ANESTHESIA, ARTHROSCOPIC LYSIS OF ADHESIONS AND SUBACROMIAL DECOMPRESSION;  Surgeon: Nicholes Stairs, MD;  Location: [place];  Service: Orthopedics;  Laterality: Right;  90MINS   TUBAL LIGATION     UPPER GASTROINTESTINAL ENDOSCOPY       Current Outpatient Medications  Medication Sig Dispense Refill   acetaminophen (TYLENOL) 500 MG tablet Take 1,000 mg by mouth every 6 (six) hours as  needed for moderate pain or headache.     Carboxymethylcell-Hypromellose (GENTEAL OP) Place 1 drop into both eyes at bedtime.     cyanocobalamin (,VITAMIN B-12,) 1000 MCG/ML injection Inject 1 mL (1,000 mcg total) into the muscle once a week. 10 mL 11   cyclobenzaprine (FLEXERIL) 5 MG tablet Take 5 mg by mouth 3 (three) times daily as needed for muscle spasms.     flecainide (TAMBOCOR) 50 MG tablet Take 1 tablet (50 mg total) by mouth 2 (two) times daily. 180 tablet 1   gabapentin (NEURONTIN) 300 MG capsule Take 300 mg by mouth daily.     HYDROcodone-acetaminophen (NORCO/VICODIN) 5-325 MG tablet Take 1 tablet by mouth every 6 (six) hours as needed for moderate pain.     ibuprofen (ADVIL) 800 MG tablet Take 800 mg by mouth every 6 (six) hours as needed for headache or moderate pain.     SYRINGE-NEEDLE, DISP, 3 ML (LUER LOCK SAFETY SYRINGES) 25G X 1" 3 ML MISC 1 application by Does not apply route once a week. 50 each 3   Vitamin D, Ergocalciferol, 50000 units CAPS Take 1 capsule by mouth once a week. 12 capsule 3   zolpidem (AMBIEN) 10 MG tablet TAKE 1 TABLET (10 MG TOTAL) BY MOUTH AT BEDTIME AS NEEDED. FOR SLEEP 30 tablet 5   No current facility-administered medications for this visit.    Allergies:   Patient has no known allergies.    Social History:  The patient  reports that she has quit smoking. Her smoking use included cigarettes. She has never used smokeless tobacco. She reports current alcohol use. She reports that she does not use drugs.   Family History:  The patient's family history includes COPD in her father; Cancer in her paternal aunt; Colon polyps in her maternal grandmother, mother, and paternal grandmother; Diabetes in her maternal grandfather, paternal grandfather, and sister; Heart Problems in her sister; Heart attack in her paternal grandfather and paternal grandmother; Heart defect in her mother and paternal grandmother; Heart disease in her maternal grandfather and paternal  grandfather; Hypertension in her father, maternal grandfather, maternal grandmother, paternal grandfather, paternal grandmother, and sister; Irritable bowel syndrome in her mother and paternal aunt; Liver disease in her father.    ROS:  Please see the history of present illness. (+) Palpitations (+) Cough (+) Dizziness (+) Fatigue/Malaise All other systems are reviewed and negative.    PHYSICAL EXAM:  VS:  BP 120/78 (BP Location: Right Arm, Patient Position: Sitting, Cuff Size: Normal)    Pulse 69    Ht '5\' 4"'$  (1.626 m)    Wt 158 lb 11.2 oz (72 kg)    LMP 03/21/2020    SpO2 99%    BMI 27.24 kg/m  , BMI Body mass index is 27.24 kg/m. GENERAL:  Well appearing HEENT:  Pupils equal round and reactive, fundi not visualized, oral mucosa unremarkable NECK:  No jugular venous distention, waveform within normal limits,  carotid upstroke brisk and symmetric, no bruits, no thyromegaly LYMPHATICS:  No cervical adenopathy LUNGS:  Clear to auscultation bilaterally HEART:  RRR.  PMI not displaced or sustained,S1 and S2 within normal limits, no S3, no S4, no clicks, no rubs, no murmurs ABD:  Flat, positive bowel sounds normal in frequency in pitch, no bruits, no rebound, no guarding, no midline pulsatile mass, no hepatomegaly, no splenomegaly EXT:  2 plus pulses throughout, no edema, no cyanosis no clubbing SKIN:  No rashes no nodules NEURO:  Cranial nerves II through XII grossly intact, motor grossly intact throughout PSYCH:  Cognitively intact, oriented to person place and time   EKG:   05/16/2021: EKG was not ordered. 11/05/2020: Sinus arrhythmia. Rate 68 bpm. 02/12/2018: sinus rhythm.  Rate 83 bpm.  Short PR interval. ?delta wave   Monitor 04/2021: 7 Day Zio Monitor   Quality: Fair.  Baseline artifact. Predominant rhythm:  Average heart rate: 79 bpm Max heart rate: 193 bpm Min heart rate: 47 bpm Pauses >2.5 seconds: None   6 runs of SVT.  The fastest was at a rate of 193 bpm.  Longest  episode was 8 beats. Rare PACs and PVCs  Echo 02/25/2020: 1. Left ventricular ejection fraction, by estimation, is 70 to 75%. The  left ventricle has hyperdynamic function. The left ventricle has no  regional wall motion abnormalities. Left ventricular diastolic parameters  are indeterminate.   2. Right ventricular systolic function is normal. The right ventricular  size is normal. There is normal pulmonary artery systolic pressure. The  estimated right ventricular systolic pressure is 09.6 mmHg.   3. The mitral valve is grossly normal. Mild mitral valve regurgitation.   4. The aortic valve is tricuspid. Aortic valve regurgitation is not  visualized.   5. The inferior vena cava is dilated in size with >50% respiratory  variability, suggesting right atrial pressure of 8 mmHg.   Monitor 02/21/2018: 7 Day Event Monitor   Quality: Fair.  Baseline artifact. Predominant rhythm: sinus  Average heart rate: 85 bpm Max heart rate: 152 bpm Min heart rate: 50 bpm   <1% PACs and PVCs noted  Recent Labs: 10/12/2020: ALT 9; BUN 16; Creatinine, Ser 0.73; Potassium 4.2; Sodium 135; TSH 1.78 03/15/2021: Hemoglobin 13.4; Platelets 334    Lipid Panel    Component Value Date/Time   CHOL 242 (H) 10/12/2020 1518   TRIG 42.0 10/12/2020 1518   HDL 87.50 10/12/2020 1518   CHOLHDL 3 10/12/2020 1518   VLDL 8.4 10/12/2020 1518   LDLCALC 146 (H) 10/12/2020 1518   LDLCALC 89 09/17/2019 1554     Wt Readings from Last 3 Encounters:  05/16/21 158 lb 11.2 oz (72 kg)  03/15/21 154 lb 9.6 oz (70.1 kg)  02/04/21 153 lb 4.8 oz (69.5 kg)      ASSESSMENT AND PLAN:  Essential hypertension Blood pressure is well controlled.  She has not required any medication.  Continue working on diet and exercise.  SVT (supraventricular tachycardia) ([place]) She continues to have episodes of SVT and palpitations consistent with PACs or PVCs.  She did not tolerate metoprolol.  I am concerned if we start diltiazem she will  struggle with bradycardia and the similar symptoms.  We will try flecainide 50 mg twice daily.  Check a basic metabolic panel and magnesium.  She continues to have symptoms despite this we will need to consider EP referral.  Current medicines are reviewed at length with the patient today.  The patient does not have concerns  regarding medicines.  The following changes have been made:  no change  Labs/ tests ordered today include:   Orders Placed This Encounter  Procedures   Magnesium   Basic metabolic panel    Disposition:    FU with Ifeoluwa Bartz C. Oval Linsey, MD, [facility] in 2 months.  I,Mathew Stumpf,acting as a Education administrator for Skeet Latch, MD.,have documented all relevant documentation on the behalf of Skeet Latch, MD,as directed by  Skeet Latch, MD while in the presence of Skeet Latch, MD.  I, Roper Oval Linsey, MD have reviewed all documentation for this visit.  The documentation of the exam, diagnosis, procedures, and orders on 05/16/2021 are all accurate and complete.  Signed, Dorsie Sethi C. Oval Linsey, MD, [facility]  05/16/2021 5:34 PM    [place]

## 2021-05-16 NOTE — Assessment & Plan Note (Signed)
Blood pressure is well controlled.  She has not required any medication.  Continue working on diet and exercise. ?

## 2021-05-16 NOTE — Patient Instructions (Addendum)
Medication Instructions:  ?START FLECAINIDE 50 MG TWICE A DAY  ? ?*If you need a refill on your cardiac medications before your next appointment, please call your pharmacy* ? ?Lab Work: ?BMET/MAGNESIUM TODAY  ? ?Testing/Procedures: ?NONE  ? ?Follow-Up: ?At [facility], you and your health needs are our priority.  As part of our continuing mission to provide you with exceptional heart care, we have created designated Provider Care Teams.  These Care Teams include your primary Cardiologist (physician) and Advanced Practice Providers (APPs -  Physician Assistants and Nurse Practitioners) who all work together to provide you with the care you need, when you need it. ? ?We recommend signing up for the patient portal called "MyChart".  Sign up information is provided on this After Visit Summary.  MyChart is used to connect with patients for Virtual Visits (Telemedicine).  Patients are able to view lab/test results, encounter notes, upcoming appointments, etc.  Non-urgent messages can be sent to your provider as well.   ?To learn more about what you can do with MyChart, go to [email or web address].   ? ?Your next appointment:   ?2 month(s) ? ?The format for your next appointment:   ?In Person ? ?Provider:   ?Skeet Latch, MD or [facility], NP{ ? ?

## 2021-05-16 NOTE — Assessment & Plan Note (Addendum)
She continues to have episodes of SVT and palpitations consistent with PACs or PVCs.  She did not tolerate metoprolol.  I am concerned if we start diltiazem she will struggle with bradycardia and the similar symptoms.  We will try flecainide 50 mg twice daily.  Check a basic metabolic panel and magnesium.  She continues to have symptoms despite this we will need to consider EP referral. ?

## 2021-05-17 LAB — MAGNESIUM: Magnesium: 1.8 mg/dL (ref 1.6–2.3)

## 2021-05-17 LAB — BASIC METABOLIC PANEL
BUN/Creatinine Ratio: 20 (ref 9–23)
BUN: 14 mg/dL (ref 6–24)
CO2: 22 mmol/L (ref 20–29)
Calcium: 9.8 mg/dL (ref 8.7–10.2)
Chloride: 104 mmol/L (ref 96–106)
Creatinine, Ser: 0.69 mg/dL (ref 0.57–1.00)
Glucose: 79 mg/dL (ref 70–99)
Potassium: 3.7 mmol/L (ref 3.5–5.2)
Sodium: 140 mmol/L (ref 134–144)
eGFR: 107 mL/min/{1.73_m2} (ref >59)

## 2021-05-19 ENCOUNTER — Encounter: Payer: PRIVATE HEALTH INSURANCE | Admitting: Psychology

## 2021-06-09 ENCOUNTER — Encounter: Payer: PRIVATE HEALTH INSURANCE | Admitting: Psychology

## 2021-07-22 ENCOUNTER — Telehealth (HOSPITAL_BASED_OUTPATIENT_CLINIC_OR_DEPARTMENT_OTHER): Payer: Self-pay

## 2021-07-22 ENCOUNTER — Telehealth: Payer: Self-pay | Admitting: Family Medicine

## 2021-07-22 NOTE — Telephone Encounter (Signed)
Surgical Clearance form to be filled out--placed in dr's folder.  Upon completion fax to: Orson Slick at [phone]

## 2021-07-22 NOTE — Telephone Encounter (Signed)
   Pre-operative Risk Assessment    Patient Name: [name]  DOB: 06-19-1970 MRN: [id number]      Request for Surgical Clearance    Procedure:   ACDF C5-7  Date of Surgery:  Clearance TBD                                 Surgeon:  Melina Schools, MD  Surgeon's Group or Practice Name:  [facility]  Phone number:  No telephone number listed  Fax number:  Orson Slick- [phone]   Type of Clearance Requested: No specifics listed    Type of Anesthesia:  Not Indicated   Additional requests/questions:  Please fax clearance to the above listed number.   Signed, Gerald Stabs   07/22/2021, 10:10 AM

## 2021-07-22 NOTE — Telephone Encounter (Signed)
   Name: [name]  DOB: Feb 16, 1971  MRN: [id number]  Primary Cardiologist: Skeet Latch, MD  Chart reviewed as part of pre-operative protocol coverage. Because of [name]'s past medical history and time since last visit, she will require a follow-up in-office visit in order to better assess preoperative cardiovascular risk. At last OV, she was restarted on flecainide 05/16/21 for episodes of SVT and palpitations c/w PACs, PVCs. She did not tolerate metoprolol in the past. She is not on any concomitant AVN blocking agents. She has not had a prior ischemic eval. Recommend OV to reassess clinical response to flecainide, repeat EKG on flecainide and discuss whether ischemic testing (given antiarrhythmic) is needed prior to surgery.  Pre-op covering staff: - Please schedule appointment and call patient to inform them. If patient already had an upcoming appointment within acceptable timeframe, please add "pre-op clearance" to the appointment notes so provider is aware. - Please contact requesting surgeon's office via preferred method (i.e, phone, fax) to inform them of need for appointment prior to surgery.  No blood thinners listed ot hold.  Charlie Pitter, PA-C  07/22/2021, 3:54 PM

## 2021-07-25 NOTE — Telephone Encounter (Signed)
Pt agreeable to sooner appt for pre op clearance. We did not cancel the July appt with Dr. Oval Linsey. Ptpt will decide about 09/2021 appt when she see's Laurann Montana, NP for pre op clearance 08/05/21 @ 1:55. Pt thanked me for the call and the help. I will update both NP for upcoming appt and requesting office.

## 2021-07-26 NOTE — Telephone Encounter (Signed)
Pt form was recieved and has been placed in Dr Sarajane Jews red folder

## 2021-08-04 NOTE — Telephone Encounter (Signed)
Pt form was completed and faxed as requested

## 2021-08-05 ENCOUNTER — Ambulatory Visit (INDEPENDENT_AMBULATORY_CARE_PROVIDER_SITE_OTHER): Payer: PRIVATE HEALTH INSURANCE | Admitting: Family

## 2021-08-05 ENCOUNTER — Encounter (HOSPITAL_BASED_OUTPATIENT_CLINIC_OR_DEPARTMENT_OTHER): Payer: Self-pay | Admitting: Family

## 2021-08-05 VITALS — BP 114/98 | HR 76 | Ht 64.0 in | Wt 154.4 lb

## 2021-08-05 DIAGNOSIS — Z01810 Encounter for preprocedural cardiovascular examination: Secondary | ICD-10-CM | POA: Diagnosis not present

## 2021-08-05 DIAGNOSIS — I471 Supraventricular tachycardia: Secondary | ICD-10-CM

## 2021-08-05 DIAGNOSIS — I491 Atrial premature depolarization: Secondary | ICD-10-CM

## 2021-08-05 DIAGNOSIS — R072 Precordial pain: Secondary | ICD-10-CM

## 2021-08-05 DIAGNOSIS — I493 Ventricular premature depolarization: Secondary | ICD-10-CM

## 2021-08-05 DIAGNOSIS — I10 Essential (primary) hypertension: Secondary | ICD-10-CM

## 2021-08-05 DIAGNOSIS — T462X5A Adverse effect of other antidysrhythmic drugs, initial encounter: Secondary | ICD-10-CM

## 2021-08-05 MED ORDER — METOPROLOL TARTRATE 50 MG PO TABS: 50.0000 mg | ORAL_TABLET | Freq: Once | ORAL | 0 refills | Status: DC

## 2021-08-05 NOTE — Progress Notes (Unsigned)
Office Visit    Patient Name: [name] Date of Encounter: 08/05/2021  PCP:  Laurey Morale, MD   [place]  Cardiologist:  Skeet Latch, MD  Advanced Practice Provider:  No care team member to display Electrophysiologist:  None      Chief Complaint    Mackenzie A Berman is a 51 y.o. female with a hx of PTSD, chronic migraine, prior PE, symptomatic anemia, PVC, PAC, HTN, chest pain, SVT presents today for preop clearance  Past Medical History    Past Medical History:  Diagnosis Date   Anemia    iron deficiency    Blood transfusion without reported diagnosis    Cardiac arrhythmia    Cellulitis    Chronic headaches    Chronic lumbar pain since 2013   sees Dr. Arvilla Market    Clotting disorder [facility])    PE prior to pregnancy.   Complication of anesthesia    Bp drops and alot of shaking after anesthesia   COVID-19 03/01/2020   Interstitial cystitis    Palpitations 02/19/2018   PE (pulmonary embolism)    due to surgery   Pneumonia    PONV (postoperative nausea and vomiting)    Sepsis ([place])    due to surgery   Staph infection    SVT (supraventricular tachycardia) ([place]) 05/16/2021   Past Surgical History:  Procedure Laterality Date   BREAST REDUCTION SURGERY     COLONOSCOPY  06/25/2019   per Dr. Havery Moros, adeomatous polyps, repeat in 3 yrs    [place]     with left ovary and mass removed   RADIOFREQUENCY ABLATION NERVES     lumbar spine, per Dr. Ace Gins    ROBOTIC ASSISTED LAPAROSCOPIC HYSTERECTOMY AND SALPINGECTOMY Bilateral 04/14/2020   Procedure: XI ROBOTIC ASSISTED TOTAL LAPAROSCOPIC HYSTERECTOMY AND SALPINGECTOMY;  Surgeon: Princess Bruins, MD;  Location: Uncertain;  Service: Gynecology;  Laterality: Bilateral;   SHOULDER ARTHROSCOPY WITH SUBACROMIAL DECOMPRESSION Right 09/14/2020   Procedure: SHOULDER MANIPULATION UNDER ANESTHESIA, ARTHROSCOPIC LYSIS OF ADHESIONS AND  SUBACROMIAL DECOMPRESSION;  Surgeon: Nicholes Stairs, MD;  Location: [place];  Service: Orthopedics;  Laterality: Right;  90MINS   TUBAL LIGATION     UPPER GASTROINTESTINAL ENDOSCOPY      Allergies  No Known Allergies  History of Present Illness    [name] is a 51 y.o. female with a hx of PTSD, chronic migraine, prior PE, symptomatic anemia, PVC, PAC, HTN, chest pain, SVT last seen 05/16/2021.  Initial evaluation in 2019 for palpitations which started when she was 70 or 51 years old.  Monitor with rare PVC and PAC.  She was provided as needed metoprolol.  She had repeat monitor showing rare PAC and PVC with up to 8 beat runs of SVT.  Last seen she noted her heart raced of 260 bpm is a recovered quickly.  She described it as feeling like a "general".  She felt sluggish on metoprolol.  She was started on flecainide with plan to pursue EP evaluation if did not improve symptoms.  She notes she started her flecainide about 3 weeks ago.  She was nervous to start it prior to that.  She has still had episodes of "adrenaline dump ", palpitations.  She is avoiding caffeine, alcohol and staying well-hydrated.  Episodes are relieved with deep breathing and vagal maneuver. She has had a couple episodes of chest pain described as a pinch or  pressure.  They last from 2 to 8 hours.  Occur at rest or with activity.  No prior ischemic evaluation. Plan for neck surgery at the end of July.  EKGs/Labs/Other Studies Reviewed:   The following studies were reviewed today:   EKG:  EKG is ordered today.  The ekg ordered today demonstrates NSR 76 bpm with no acute ST/T wave changes.   Recent Labs: 10/12/2020: ALT 9; TSH 1.78 03/15/2021: Hemoglobin 13.4; Platelets 334 05/16/2021: BUN 14; Creatinine, Ser 0.69; Magnesium 1.8; Potassium 3.7; Sodium 140  Recent Lipid Panel    Component Value Date/Time   CHOL 242 (H) 10/12/2020 1518   TRIG 42.0 10/12/2020 1518   HDL 87.50 10/12/2020 1518   CHOLHDL 3  10/12/2020 1518   VLDL 8.4 10/12/2020 1518   LDLCALC 146 (H) 10/12/2020 1518   LDLCALC 89 09/17/2019 1554     Home Medications   Current Meds  Medication Sig   acetaminophen (TYLENOL) 500 MG tablet Take 1,000 mg by mouth every 6 (six) hours as needed for moderate pain or headache.   Carboxymethylcell-Hypromellose (GENTEAL OP) Place 1 drop into both eyes at bedtime.   cyanocobalamin (,VITAMIN B-12,) 1000 MCG/ML injection Inject 1 mL (1,000 mcg total) into the muscle once a week.   cyclobenzaprine (FLEXERIL) 5 MG tablet Take 5 mg by mouth 3 (three) times daily as needed for muscle spasms.   flecainide (TAMBOCOR) 50 MG tablet Take 1 tablet (50 mg total) by mouth 2 (two) times daily.   gabapentin (NEURONTIN) 300 MG capsule Take 300 mg by mouth daily.   ibuprofen (ADVIL) 800 MG tablet Take 800 mg by mouth every 6 (six) hours as needed for headache or moderate pain.   ofloxacin (OCUFLOX) 0.3 % ophthalmic solution Place 1 drop into the left eye 4 (four) times daily.   prednisoLONE acetate (PRED FORTE) 1 % ophthalmic suspension Place 1 drop into the left eye 4 (four) times daily.   SYRINGE-NEEDLE, DISP, 3 ML (LUER LOCK SAFETY SYRINGES) 25G X 1" 3 ML MISC 1 application by Does not apply route once a week.   Vitamin D, Ergocalciferol, 50000 units CAPS Take 1 capsule by mouth once a week.   zolpidem (AMBIEN) 10 MG tablet TAKE 1 TABLET (10 MG TOTAL) BY MOUTH AT BEDTIME AS NEEDED. FOR SLEEP     Review of Systems      All other systems reviewed and are otherwise negative except as noted above.  Physical Exam    VS:  BP (!) 114/98 (BP Location: Right Arm, Patient Position: Sitting, Cuff Size: Normal)   Pulse 76   Ht '5\' 4"'$  (1.626 m)   Wt 154 lb 6.4 oz (70 kg)   LMP 03/21/2020   BMI 26.50 kg/m  , BMI Body mass index is 26.5 kg/m.  Wt Readings from Last 3 Encounters:  08/05/21 154 lb 6.4 oz (70 kg)  05/16/21 158 lb 11.2 oz (72 kg)  03/15/21 154 lb 9.6 oz (70.1 kg)     GEN: Well nourished,  well developed, in no acute distress. HEENT: normal. Neck: Supple, no JVD, carotid bruits, or masses. Cardiac: RRR, no murmurs, rubs, or gallops. No clubbing, cyanosis, edema.  Radials/PT 2+ and equal bilaterally.  Respiratory:  Respirations regular and unlabored, clear to auscultation bilaterally. GI: Soft, nontender, nondistended. MS: No deformity or atrophy. Skin: Warm and dry, no rash. Neuro:  Strength and sensation are intact. Psych: Normal affect.  Assessment & Plan    Preop clearance-ending ACDF C5-7 with Dr. Rolena Infante of  [facility].  Tentatively plan for end of July.  Exercise tolerance greater than 4 METS.  EKG today no acute ST/wave changes.  However she has been having episodes of chest tightness that occur both at rest with activity.  She is newly on flecainide has not had ischemic evaluation.  We discussed ETT versus cardiac CTA.  Given her hip issues she does not think she will be able to walk on a treadmill.  As such plan for cardiac CTA prior to clearance being provided.  SVT /PVC/PAC  /high risk medication use -did not tolerate metoprolol with 50 [place].  Concern diltiazem will cause bradycardia and similar symptoms per Dr. Oval Linsey.  Was prescribed flecainide 05/2021 which is started 3 weeks ago.  Symptoms of palpitations, heart racing have been about the same.  Feels like a "adrenaline dump".  Will refer to EP for further management.  She already avoids caffeine, alcohol.  HTN -blood pressure well controlled.  Not requiring medication.  Controlled via diet and exercise.     Disposition: Follow up in 3 month(s) with Skeet Latch, MD or APP.  Signed, Loel Dubonnet, NP 08/05/2021, 2:16 PM [place]

## 2021-08-05 NOTE — Patient Instructions (Addendum)
Medication Instructions:  Your Physician recommend you continue on your current medication as directed.    *If you need a refill on your cardiac medications before your next appointment, please call your pharmacy*   Lab Work: Your physician recommends that you return for lab work today- BMET    If you have labs (blood work) drawn today and your tests are completely normal, you will receive your results only by: MyChart Message (if you have MyChart) OR A paper copy in the mail If you have any lab test that is abnormal or we need to change your treatment, we will call you to review the results.   Testing/Procedures: Your physician has requested that you have cardiac CT. Cardiac computed tomography (CT) is a painless test that uses an x-ray machine to take clear, detailed pictures of your heart. For further information please visit [email or web address]. Please follow instruction sheet as given.  We will send your preoperative clearance based on the results of your Cardiac CT.    Follow-Up: At [facility], you and your health needs are our priority.  As part of our continuing mission to provide you with exceptional heart care, we have created designated Provider Care Teams.  These Care Teams include your primary Cardiologist (physician) and Advanced Practice Providers (APPs -  Physician Assistants and Nurse Practitioners) who all work together to provide you with the care you need, when you need it.  We recommend signing up for the patient portal called "MyChart".  Sign up information is provided on this After Visit Summary.  MyChart is used to connect with patients for Virtual Visits (Telemedicine).  Patients are able to view lab/test results, encounter notes, upcoming appointments, etc.  Non-urgent messages can be sent to your provider as well.   To learn more about what you can do with MyChart, go to [email or web address].    Your next appointment:   3 month(s)  The format for your  next appointment:   In Person  Provider:   Skeet Latch, MD or Laurann Montana, NP{  Other Instructions   Your cardiac CT will be scheduled at one of the below locations:   [facility] [address] [place], [place] [address] [phone]  If scheduled at [facility], please arrive at the [facility] and Children's Entrance (Entrance C2) of [facility] 30 minutes prior to test start time. You can use the FREE valet parking offered at entrance C (encouraged to control the heart rate for the test)  Proceed to the [facility] Radiology Department (first floor) to check-in and test prep.  All radiology patients and guests should use entrance C2 at [facility], accessed from [facility] [place], even though the hospital's physical address listed is [address]    Please follow these instructions carefully (unless otherwise directed):   On the Night Before the Test: Be sure to Drink plenty of water. Do not consume any caffeinated/decaffeinated beverages or chocolate 12 hours prior to your test. Do not take any antihistamines 12 hours prior to your test.  On the Day of the Test: Drink plenty of water until 1 hour prior to the test. Do not eat any food 4 hours prior to the test. You may take your regular medications prior to the test.  Take metoprolol (Lopressor) two hours prior to test. FEMALES- please wear underwire-free bra if available, avoid dresses & tight clothing       After the Test: Drink plenty of water. After receiving  IV contrast, you may experience a mild flushed feeling. This is normal. On occasion, you may experience a mild rash up to 24 hours after the test. This is not dangerous. If this occurs, you can take Benadryl 25 mg and increase your fluid intake. If you experience trouble breathing, this can be serious. If it is severe call 911 IMMEDIATELY. If it is mild, please call our office. If you take any of  these medications: Glipizide/Metformin, Avandament, Glucavance, please do not take 48 hours after completing test unless otherwise instructed.  We will call to schedule your test 2-4 weeks out understanding that some insurance companies will need an authorization prior to the service being performed.   For non-scheduling related questions, please contact the cardiac imaging nurse navigator should you have any questions/concerns: Marchia Bond, Cardiac Imaging Nurse Navigator Gordy Clement, Cardiac Imaging Nurse Navigator [facility] Direct Office Dial: [phone]   For scheduling needs, including cancellations and rescheduling, please call [place], [phone].   Important Information About Sugar

## 2021-08-06 ENCOUNTER — Encounter (HOSPITAL_BASED_OUTPATIENT_CLINIC_OR_DEPARTMENT_OTHER): Payer: Self-pay | Admitting: Family

## 2021-08-06 LAB — BASIC METABOLIC PANEL
BUN/Creatinine Ratio: 15 (ref 9–23)
BUN: 12 mg/dL (ref 6–24)
CO2: 24 mmol/L (ref 20–29)
Calcium: 10.2 mg/dL (ref 8.7–10.2)
Chloride: 101 mmol/L (ref 96–106)
Creatinine, Ser: 0.81 mg/dL (ref 0.57–1.00)
Glucose: 90 mg/dL (ref 70–99)
Potassium: 4.6 mmol/L (ref 3.5–5.2)
Sodium: 142 mmol/L (ref 134–144)
eGFR: 89 mL/min/{1.73_m2} (ref >59)

## 2021-08-09 ENCOUNTER — Encounter: Payer: PRIVATE HEALTH INSURANCE | Admitting: Psychology

## 2021-08-09 ENCOUNTER — Telehealth (HOSPITAL_BASED_OUTPATIENT_CLINIC_OR_DEPARTMENT_OTHER): Payer: Self-pay

## 2021-08-09 NOTE — Telephone Encounter (Addendum)
Results called to patient who verbalizes understanding!     ----- Message from Loel Dubonnet, NP sent at 08/08/2021  8:11 AM EDT ----- Normal kidney function, electrolytes. Proceed with cardiac CTA as discussed in recent clinic visit.

## 2021-08-11 ENCOUNTER — Telehealth (HOSPITAL_COMMUNITY): Payer: Self-pay | Admitting: *Deleted

## 2021-08-11 NOTE — Telephone Encounter (Signed)
Reaching out to patient to offer assistance regarding upcoming cardiac imaging study; pt verbalizes understanding of appt date/time, parking situation and where to check in, pre-test NPO status and medications ordered, and verified current allergies; name and call back number provided for further questions should they arise  Gordy Clement RN Navigator Cardiac Imaging [facility] [phone] office [phone] cell  Patient to take '50mg'$  metoprolol tartrate two hours prior to her cardiac CT scan.  She is aware to arrive at 3:30pm.

## 2021-08-12 ENCOUNTER — Telehealth (HOSPITAL_BASED_OUTPATIENT_CLINIC_OR_DEPARTMENT_OTHER): Payer: Self-pay

## 2021-08-12 ENCOUNTER — Ambulatory Visit (HOSPITAL_COMMUNITY)
Admission: RE | Admit: 2021-08-12 | Discharge: 2021-08-12 | Disposition: A | Discharge status: Home / Self Care | Payer: PRIVATE HEALTH INSURANCE | Source: Ambulatory Visit | Attending: Family | Admitting: Family

## 2021-08-12 DIAGNOSIS — R072 Precordial pain: Secondary | ICD-10-CM | POA: Diagnosis present

## 2021-08-12 MED ORDER — NITROGLYCERIN 0.4 MG SL SUBL
SUBLINGUAL_TABLET | SUBLINGUAL | Status: AC
  Filled 2021-08-12: qty 2

## 2021-08-12 MED ORDER — IOHEXOL 350 MG/ML SOLN
100.0000 mL | Freq: Once | INTRAVENOUS | Status: AC | PRN
  Administered 2021-08-12: 100 mL via INTRAVENOUS

## 2021-08-12 MED ORDER — NITROGLYCERIN 0.4 MG SL SUBL
0.8000 mg | SUBLINGUAL_TABLET | Freq: Once | SUBLINGUAL | Status: AC
  Administered 2021-08-12: 0.8 mg via SUBLINGUAL

## 2021-08-12 NOTE — Telephone Encounter (Addendum)
Results called to patient who verbalizes understanding!      ----- Message from Loel Dubonnet, NP sent at 08/12/2021  4:48 PM EDT ----- Coronary calcium score of 0.  No evidence of heart disease.  Great result! Forwarded to surgical team that she is cleared for her procedure.

## 2021-08-12 NOTE — Telephone Encounter (Signed)
    Patient Name: [name]  DOB: Jul 18, 1970 MRN: [id number]  Primary Cardiologist: Skeet Latch, MD  Chart reviewed as part of pre-operative protocol coverage. Given past medical history and time since last visit, based on ACC/AHA guidelines, Brettany A Veloso would be at acceptable risk for the planned procedure without further cardiovascular testing.   Cardiac CTA was performed 6 9/23 with coronary calcium score of 0.  The patient was advised that if she develops new symptoms prior to surgery to contact our office to arrange for a follow-up visit, and she verbalized understanding.  I will route this recommendation to the requesting party via Epic fax function and remove from pre-op pool.  Please call with questions.  Loel Dubonnet, NP 08/12/2021, 4:49 PM

## 2021-09-09 ENCOUNTER — Ambulatory Visit: Payer: Self-pay | Admitting: Orthopedic Surgery

## 2021-09-12 ENCOUNTER — Inpatient Hospital Stay: Payer: PRIVATE HEALTH INSURANCE | Admitting: Hematology and Oncology

## 2021-09-12 ENCOUNTER — Telehealth: Payer: Self-pay | Admitting: Hematology and Oncology

## 2021-09-12 NOTE — Telephone Encounter (Signed)
Rescheduled appointment per provider. Left message.

## 2021-09-14 ENCOUNTER — Encounter: Payer: PRIVATE HEALTH INSURANCE | Admitting: Psychology

## 2021-09-15 ENCOUNTER — Inpatient Hospital Stay: Payer: PRIVATE HEALTH INSURANCE

## 2021-09-15 ENCOUNTER — Encounter: Payer: Self-pay | Admitting: Hematology and Oncology

## 2021-09-15 ENCOUNTER — Inpatient Hospital Stay: Payer: PRIVATE HEALTH INSURANCE | Attending: Hematology and Oncology | Admitting: Hematology and Oncology

## 2021-09-15 ENCOUNTER — Other Ambulatory Visit: Payer: Self-pay

## 2021-09-15 VITALS — BP 141/86 | HR 89 | Temp 97.7°F | Resp 16 | Wt 156.7 lb

## 2021-09-15 DIAGNOSIS — E538 Deficiency of other specified B group vitamins: Secondary | ICD-10-CM | POA: Diagnosis not present

## 2021-09-15 DIAGNOSIS — R42 Dizziness and giddiness: Secondary | ICD-10-CM | POA: Insufficient documentation

## 2021-09-15 DIAGNOSIS — D649 Anemia, unspecified: Secondary | ICD-10-CM | POA: Diagnosis present

## 2021-09-15 DIAGNOSIS — Z79899 Other long term (current) drug therapy: Secondary | ICD-10-CM | POA: Diagnosis not present

## 2021-09-15 DIAGNOSIS — D500 Iron deficiency anemia secondary to blood loss (chronic): Secondary | ICD-10-CM

## 2021-09-15 DIAGNOSIS — Z87891 Personal history of nicotine dependence: Secondary | ICD-10-CM | POA: Diagnosis not present

## 2021-09-15 LAB — IRON AND IRON BINDING CAPACITY (CC-WL,HP ONLY)
Iron: 58 ug/dL (ref 28–170)
Saturation Ratios: 18 % (ref 10.4–31.8)
TIBC: 315 ug/dL (ref 250–450)
UIBC: 257 ug/dL (ref 148–442)

## 2021-09-15 LAB — CBC WITH DIFFERENTIAL/PLATELET
Abs Immature Granulocytes: 0.01 10*3/uL (ref 0.00–0.07)
Basophils Absolute: 0 10*3/uL (ref 0.0–0.1)
Basophils Relative: 1 %
Eosinophils Absolute: 0.1 10*3/uL (ref 0.0–0.5)
Eosinophils Relative: 1 %
HCT: 41.3 % (ref 36.0–46.0)
Hemoglobin: 14 g/dL (ref 12.0–15.0)
Immature Granulocytes: 0 %
Lymphocytes Relative: 28 %
Lymphs Abs: 1.3 10*3/uL (ref 0.7–4.0)
MCH: 31.1 pg (ref 26.0–34.0)
MCHC: 33.9 g/dL (ref 30.0–36.0)
MCV: 91.8 fL (ref 80.0–100.0)
Monocytes Absolute: 0.3 10*3/uL (ref 0.1–1.0)
Monocytes Relative: 7 %
Neutro Abs: 2.9 10*3/uL (ref 1.7–7.7)
Neutrophils Relative %: 63 %
Platelets: 286 10*3/uL (ref 150–400)
RBC: 4.5 MIL/uL (ref 3.87–5.11)
RDW: 12.6 % (ref 11.5–15.5)
WBC: 4.6 10*3/uL (ref 4.0–10.5)
nRBC: 0 % (ref 0.0–0.2)

## 2021-09-15 LAB — VITAMIN B12: Vitamin B-12: 309 pg/mL (ref 180–914)

## 2021-09-15 NOTE — Progress Notes (Signed)
[place] NOTE  Patient Care Team: Laurey Morale, MD as PCP - General Skeet Latch, MD as PCP - Cardiology (Cardiology) Skeet Latch, MD as Consulting Physician (Cardiology)  CHIEF COMPLAINTS/PURPOSE OF CONSULTATION:  IDA  ASSESSMENT & PLAN:   This is a very pleasant 51 year old female patient with severe anemia likely secondary to menstrual blood loss, had hysterectomy and required intravenous iron infusions, last iron infusion in March 2022 returns for follow-up.  She is doing quite well except for some fatigue and dizziness which she attributes to flecainide.  She is also working with cardiology for presurgical clearance, anticipating cervical disc surgery on August 14.  She does not have any menstrual cycle anymore, denies any pica.  Labs from today show complete correction of hemoglobin at 14 g/dL.  Rest of the labs are pending. At this time from hematological standpoint, she is a low risk for complications. She will return to clinic in 1 year or sooner as needed Thank you for consulting [place] in the care of this patient.  Please do not hesitate to contact us with any additional questions or concerns  HISTORY OF PRESENTING ILLNESS:   [name] 51 y.o. female is here because of severe anemia disproportionate to the menstrual blood loss.  Interim History  Since last visit, she feels ok, feels more tired, dizzy, wondering if this is from flecainide. She not craving ice as much, doesn't have a period anymore. She is having some neurosurgery planned for cervical disk 8/14 No blood in stool.  Rest of the pertinent ros reviewed and neg  REVIEW OF SYSTEMS:   Constitutional: Denies fevers, chills or abnormal night sweats.  Rest as mentioned in HPI Eyes: Denies blurriness of vision, double vision or watery eyes Ears, nose, mouth, throat, and face: Denies mucositis or sore throat Respiratory: Denies cough, dyspnea or wheezes Cardiovascular:  Complains of palpitations, vague chest pain on the left side Gastrointestinal:  Denies nausea, heartburn or change in bowel habits Skin: Denies abnormal skin rashes Lymphatics: Denies new lymphadenopathy or easy bruising Neurological:Denies numbness, tingling or new weaknesses Behavioral/Psych: Mood is stable, no new changes  All other systems were reviewed with the patient and are negative.  MEDICAL HISTORY:  Past Medical History:  Diagnosis Date   Anemia    iron deficiency    Blood transfusion without reported diagnosis    Cardiac arrhythmia    Cellulitis    Chronic headaches    Chronic lumbar pain since 2013   sees Dr. Arvilla Market    Clotting disorder [facility])    PE prior to pregnancy.   Complication of anesthesia    Bp drops and alot of shaking after anesthesia   COVID-19 03/01/2020   Interstitial cystitis    Palpitations 02/19/2018   PE (pulmonary embolism)    due to surgery   Pneumonia    PONV (postoperative nausea and vomiting)    Sepsis ([place])    due to surgery   Staph infection    SVT (supraventricular tachycardia) ([place]) 05/16/2021    SURGICAL HISTORY: Past Surgical History:  Procedure Laterality Date   BREAST REDUCTION SURGERY     COLONOSCOPY  06/25/2019   per Dr. Havery Moros, adeomatous polyps, repeat in 3 yrs    [place]     with left ovary and mass removed   RADIOFREQUENCY ABLATION NERVES     lumbar spine, per Dr. Ace Gins    ROBOTIC ASSISTED LAPAROSCOPIC HYSTERECTOMY  AND SALPINGECTOMY Bilateral 04/14/2020   Procedure: XI ROBOTIC ASSISTED TOTAL LAPAROSCOPIC HYSTERECTOMY AND SALPINGECTOMY;  Surgeon: Princess Bruins, MD;  Location: [place];  Service: Gynecology;  Laterality: Bilateral;   SHOULDER ARTHROSCOPY WITH SUBACROMIAL DECOMPRESSION Right 09/14/2020   Procedure: SHOULDER MANIPULATION UNDER ANESTHESIA, ARTHROSCOPIC LYSIS OF ADHESIONS AND SUBACROMIAL DECOMPRESSION;  Surgeon: Nicholes Stairs, MD;   Location: [place];  Service: Orthopedics;  Laterality: Right;  90MINS   TUBAL LIGATION     UPPER GASTROINTESTINAL ENDOSCOPY      SOCIAL HISTORY: Social History   Socioeconomic History   Marital status: Married    Spouse name: Not on file   Number of children: 3   Years of education: Not on file   Highest education level: Not on file  Occupational History   Occupation: Glass blower/designer  Tobacco Use   Smoking status: Former    Years: 1.00    Types: Cigarettes   Smokeless tobacco: Never   Tobacco comments:    age 51  Vaping Use   Vaping Use: Never used  Substance and Sexual Activity   Alcohol use: Yes    Alcohol/week: 0.0 standard drinks of alcohol    Comment: glass of wine occasionally   Drug use: No   Sexual activity: Yes    Partners: Male    Birth control/protection: None    Comment: Tubal ligation  Other Topics Concern   Not on file  Social History Narrative   Lives at home with husband and 2 children   Right handed   Caffeine: 1-2 cups of coffee per day   Social Determinants of Health   Financial Resource Strain: Not on file  Food Insecurity: Not on file  Transportation Needs: Not on file  Physical Activity: Not on file  Stress: Not on file  Social Connections: Not on file  Intimate Partner Violence: Not on file    FAMILY HISTORY: Family History  Problem Relation Age of Onset   Colon polyps Mother    Irritable bowel syndrome Mother    Heart defect Mother        tachycardia   Colon polyps Paternal Grandmother    Hypertension Paternal Grandmother    Heart defect Paternal Grandmother    Heart attack Paternal Grandmother    Colon polyps Maternal Grandmother    Hypertension Maternal Grandmother    Diabetes Paternal Grandfather    Heart disease Paternal Grandfather    Hypertension Paternal Grandfather    Heart attack Paternal Grandfather    Diabetes Maternal Grandfather    Heart disease Maternal Grandfather    Hypertension Maternal Grandfather     Diabetes Sister    Hypertension Sister    Heart Problems Sister    Irritable bowel syndrome Paternal Aunt    Cancer Paternal Aunt        melanoma   Liver disease Father    Hypertension Father    COPD Father    Colon cancer Neg Hx    Esophageal cancer Neg Hx    Rectal cancer Neg Hx    Stomach cancer Neg Hx     ALLERGIES:  has No Known Allergies.  MEDICATIONS:  Current Outpatient Medications  Medication Sig Dispense Refill   acetaminophen (TYLENOL) 500 MG tablet Take 1,000 mg by mouth every 6 (six) hours as needed for moderate pain or headache.     Carboxymethylcell-Hypromellose (GENTEAL OP) Place 1 drop into both eyes at bedtime.     cyclobenzaprine (FLEXERIL) 5 MG tablet Take 5 mg by mouth 3 (three)  times daily as needed for muscle spasms.     flecainide (TAMBOCOR) 50 MG tablet Take 1 tablet (50 mg total) by mouth 2 (two) times daily. 180 tablet 1   gabapentin (NEURONTIN) 300 MG capsule Take 300 mg by mouth daily.     ibuprofen (ADVIL) 800 MG tablet Take 800 mg by mouth every 6 (six) hours as needed for headache or moderate pain.     ofloxacin (OCUFLOX) 0.3 % ophthalmic solution Place 1 drop into the left eye 4 (four) times daily.     prednisoLONE acetate (PRED FORTE) 1 % ophthalmic suspension Place 1 drop into the left eye 4 (four) times daily.     SYRINGE-NEEDLE, DISP, 3 ML (LUER LOCK SAFETY SYRINGES) 25G X 1" 3 ML MISC 1 application by Does not apply route once a week. 50 each 3   Vitamin D, Ergocalciferol, 50000 units CAPS Take 1 capsule by mouth once a week. 12 capsule 3   zolpidem (AMBIEN) 10 MG tablet TAKE 1 TABLET (10 MG TOTAL) BY MOUTH AT BEDTIME AS NEEDED. FOR SLEEP 30 tablet 5   No current facility-administered medications for this visit.    PHYSICAL EXAMINATION:  ECOG PERFORMANCE STATUS: 2 - Symptomatic, <50% confined to bed  Vitals:   09/15/21 1444  BP: (!) 141/86  Pulse: 89  Resp: 16  Temp: 97.7 F (36.5 C)  SpO2: 100%    Filed Weights   09/15/21 1444   Weight: 156 lb 11.2 oz (71.1 kg)   Physical Exam Constitutional:      Appearance: Normal appearance.  Cardiovascular:     Rate and Rhythm: Normal rate and regular rhythm.     Pulses: Normal pulses.     Heart sounds: Normal heart sounds.  Musculoskeletal:        General: No swelling.     Cervical back: Normal range of motion and neck supple. No rigidity.  Lymphadenopathy:     Cervical: No cervical adenopathy.  Skin:    General: Skin is warm and dry.  Neurological:     General: No focal deficit present.     Mental Status: She is alert.  Psychiatric:        Mood and Affect: Mood normal.     LABORATORY DATA:  I have reviewed the data as listed Lab Results  Component Value Date   WBC 5.7 03/15/2021   HGB 13.4 03/15/2021   HCT 40.6 03/15/2021   MCV 92.7 03/15/2021   PLT 334 03/15/2021     Chemistry      Component Value Date/Time   NA 142 08/05/2021 1505   K 4.6 08/05/2021 1505   CL 101 08/05/2021 1505   CO2 24 08/05/2021 1505   BUN 12 08/05/2021 1505   CREATININE 0.81 08/05/2021 1505   CREATININE 0.64 09/17/2019 1554      Component Value Date/Time   CALCIUM 10.2 08/05/2021 1505   ALKPHOS 44 10/12/2020 1518   AST 11 10/12/2020 1518   ALT 9 10/12/2020 1518   BILITOT 0.5 10/12/2020 1518      RADIOGRAPHIC STUDIES: I have personally reviewed the radiological images as listed and agreed with the findings in the report. No results found. Labs from today reviewed,   CBC from today showed a hemoglobin of 14 g/dL.  Rest of the labs pending  Time  I spent a total of 20 minutes in the care of this patient including history and physical, review of medical records, counseling and coordination of care.    Benay Pike,  MD 09/15/2021 3:02 PM

## 2021-09-16 LAB — FERRITIN: Ferritin: 37 ng/mL (ref 11–307)

## 2021-09-19 ENCOUNTER — Ambulatory Visit: Payer: PRIVATE HEALTH INSURANCE | Admitting: Psychology

## 2021-09-20 NOTE — Progress Notes (Unsigned)
Electrophysiology Office Note:    Date:  09/21/2021   ID:  EVOLEHT HOVATTER, DOB 07-03-1970, MRN [id number]  PCP:  Laurey Morale, MD  [facility] Cardiologist:  Skeet Latch, MD  [facility] Electrophysiologist:  Vickie Epley, MD   Referring MD: Loel Dubonnet, NP   Chief Complaint: Arrhythmia  History of Present Illness:    [name] is a 51 y.o. female who presents for an evaluation of arrhythmias at the request of Laurann Montana, NP. Their medical history includes PACs, PVCs, hypertension, SVT, chronic migraines, prior pulmonary embolism, anemia and PTSD.  The patient last saw Laurann Montana August 05, 2021.  Her diagnosis of palpitations dates back to at least 2019.  As needed metoprolol was prescribed.  She was previously started on flecainide.  A cardiac CTA was recommended by [facility] given the patient's inability to walk on a treadmill because of joint issues.     Past Medical History:  Diagnosis Date   Anemia    iron deficiency    Blood transfusion without reported diagnosis    Cardiac arrhythmia    Cellulitis    Chronic headaches    Chronic lumbar pain since 2013   sees Dr. Arvilla Market    Clotting disorder [facility])    PE prior to pregnancy.   Complication of anesthesia    Bp drops and alot of shaking after anesthesia   COVID-19 03/01/2020   Interstitial cystitis    Palpitations 02/19/2018   PE (pulmonary embolism)    due to surgery   Pneumonia    PONV (postoperative nausea and vomiting)    Sepsis ([place])    due to surgery   Staph infection    SVT (supraventricular tachycardia) ([place]) 05/16/2021    Past Surgical History:  Procedure Laterality Date   BREAST REDUCTION SURGERY     COLONOSCOPY  06/25/2019   per Dr. Havery Moros, adeomatous polyps, repeat in 3 yrs    [place]     with left ovary and mass removed   RADIOFREQUENCY ABLATION NERVES     lumbar spine, per Dr. Ace Gins    ROBOTIC  ASSISTED LAPAROSCOPIC HYSTERECTOMY AND SALPINGECTOMY Bilateral 04/14/2020   Procedure: XI ROBOTIC ASSISTED TOTAL LAPAROSCOPIC HYSTERECTOMY AND SALPINGECTOMY;  Surgeon: Princess Bruins, MD;  Location: [place];  Service: Gynecology;  Laterality: Bilateral;   SHOULDER ARTHROSCOPY WITH SUBACROMIAL DECOMPRESSION Right 09/14/2020   Procedure: SHOULDER MANIPULATION UNDER ANESTHESIA, ARTHROSCOPIC LYSIS OF ADHESIONS AND SUBACROMIAL DECOMPRESSION;  Surgeon: Nicholes Stairs, MD;  Location: [place];  Service: Orthopedics;  Laterality: Right;  90MINS   TUBAL LIGATION     UPPER GASTROINTESTINAL ENDOSCOPY      Current Medications: Current Meds  Medication Sig   acetaminophen (TYLENOL) 500 MG tablet Take 1,000 mg by mouth every 6 (six) hours as needed for moderate pain or headache.   Carboxymethylcell-Hypromellose (GENTEAL OP) Place 1 drop into both eyes at bedtime.   cyclobenzaprine (FLEXERIL) 5 MG tablet Take 5 mg by mouth 3 (three) times daily as needed for muscle spasms.   gabapentin (NEURONTIN) 300 MG capsule Take 300 mg by mouth daily.   ibuprofen (ADVIL) 800 MG tablet Take 800 mg by mouth every 6 (six) hours as needed for headache or moderate pain.   ofloxacin (OCUFLOX) 0.3 % ophthalmic solution Place 1 drop into the left eye 4 (four) times daily.   prednisoLONE acetate (PRED FORTE) 1 % ophthalmic suspension Place 1 drop into the left  eye 4 (four) times daily.   propranolol (INDERAL) 20 MG tablet Take 1 tablet (20 mg total) by mouth 2 (two) times daily.   SYRINGE-NEEDLE, DISP, 3 ML (LUER LOCK SAFETY SYRINGES) 25G X 1" 3 ML MISC 1 application by Does not apply route once a week.   Vitamin D, Ergocalciferol, 50000 units CAPS Take 1 capsule by mouth once a week.   zolpidem (AMBIEN) 10 MG tablet TAKE 1 TABLET (10 MG TOTAL) BY MOUTH AT BEDTIME AS NEEDED. FOR SLEEP   [DISCONTINUED] flecainide (TAMBOCOR) 50 MG tablet Take 1 tablet (50 mg total) by mouth 2 (two) times daily.     Allergies:   Patient has no  known allergies.   Social History   Socioeconomic History   Marital status: Married    Spouse name: Not on file   Number of children: 3   Years of education: Not on file   Highest education level: Not on file  Occupational History   Occupation: Glass blower/designer  Tobacco Use   Smoking status: Former    Years: 1.00    Types: Cigarettes   Smokeless tobacco: Never   Tobacco comments:    age 70  Vaping Use   Vaping Use: Never used  Substance and Sexual Activity   Alcohol use: Yes    Alcohol/week: 0.0 standard drinks of alcohol    Comment: glass of wine occasionally   Drug use: No   Sexual activity: Yes    Partners: Male    Birth control/protection: None    Comment: Tubal ligation  Other Topics Concern   Not on file  Social History Narrative   Lives at home with husband and 2 children   Right handed   Caffeine: 1-2 cups of coffee per day   Social Determinants of Health   Financial Resource Strain: Not on file  Food Insecurity: Not on file  Transportation Needs: Not on file  Physical Activity: Not on file  Stress: Not on file  Social Connections: Not on file     Family History: The patient's family history includes COPD in her father; Cancer in her paternal aunt; Colon polyps in her maternal grandmother, mother, and paternal grandmother; Diabetes in her maternal grandfather, paternal grandfather, and sister; Heart Problems in her sister; Heart attack in her paternal grandfather and paternal grandmother; Heart defect in her mother and paternal grandmother; Heart disease in her maternal grandfather and paternal grandfather; Hypertension in her father, maternal grandfather, maternal grandmother, paternal grandfather, paternal grandmother, and sister; Irritable bowel syndrome in her mother and paternal aunt; Liver disease in her father. There is no history of Colon cancer, Esophageal cancer, Rectal cancer, or Stomach cancer.  ROS:   Please see the history of present illness.     All other systems reviewed and are negative.  EKGs/Labs/Other Studies Reviewed:    The following studies were reviewed today:  April 15, 2021 ZIO monitor Rare PACs and PVCs.  Symptom triggered events correspond to sinus rhythm with PVCs.  Recorded "SVT" corresponds to probable AT.  08/12/2021 CT coronary CAC 0   Recent Labs: 10/12/2020: ALT 9; TSH 1.78 05/16/2021: Magnesium 1.8 08/05/2021: BUN 12; Creatinine, Ser 0.81; Potassium 4.6; Sodium 142 09/15/2021: Hemoglobin 14.0; Platelets 286  Recent Lipid Panel    Component Value Date/Time   CHOL 242 (H) 10/12/2020 1518   TRIG 42.0 10/12/2020 1518   HDL 87.50 10/12/2020 1518   CHOLHDL 3 10/12/2020 1518   VLDL 8.4 10/12/2020 1518   LDLCALC 146 (H) 10/12/2020 1518  [place] 89 09/17/2019 1554    Physical Exam:    VS:  BP 128/62   Pulse 68   Ht 5' 4.5" (1.638 m)   Wt 158 lb (71.7 kg)   LMP 03/21/2020   SpO2 97%   BMI 26.70 kg/m     Wt Readings from Last 3 Encounters:  09/21/21 158 lb (71.7 kg)  09/15/21 156 lb 11.2 oz (71.1 kg)  08/05/21 154 lb 6.4 oz (70 kg)     GEN:  Well nourished, well developed in no acute distress HEENT: Normal NECK: No JVD; No carotid bruits LYMPHATICS: No lymphadenopathy CARDIAC: RRR, no murmurs, rubs, gallops RESPIRATORY:  Clear to auscultation without rales, wheezing or rhonchi  ABDOMEN: Soft, non-tender, non-distended MUSCULOSKELETAL:  No edema; No deformity  SKIN: Warm and dry NEUROLOGIC:  Alert and oriented x 3 PSYCHIATRIC:  Normal affect       ASSESSMENT:    1. Palpitations    PLAN:    In order of problems listed above:  #Palpitations I suspect she is feeling her rare PVCs.  We had a long discussion in clinic today about her arrhythmia.  I do not think there is a sign of any sort of more significant underlying disease process.  I do think there is an element of anxiety that is exacerbating her symptoms.  I think she is very in tune with her PVCs and palpitations.  I do not  think flecainide is a good option given the potential for off target effects and low burden of PVCs.  Stop flecainide today and trial propranolol 20 mg by mouth twice daily.  If this fails, would plan a trial of diltiazem.  Follow-up 8 weeks with APP.    Total time spent with patient today 45 minutes. This includes reviewing records, evaluating the patient and coordinating care.  Medication Adjustments/Labs and Tests Ordered: Current medicines are reviewed at length with the patient today.  Concerns regarding medicines are outlined above.  No orders of the defined types were placed in this encounter.  Meds ordered this encounter  Medications   propranolol (INDERAL) 20 MG tablet    Sig: Take 1 tablet (20 mg total) by mouth 2 (two) times daily.    Dispense:  180 tablet    Refill:  3     Signed, Taeja Debellis T. Quentin Ore, MD, [facility], [facility] 09/21/2021 10:05 PM    Electrophysiology [facility]

## 2021-09-21 ENCOUNTER — Encounter: Payer: Self-pay | Admitting: Cardiology

## 2021-09-21 ENCOUNTER — Ambulatory Visit (HOSPITAL_BASED_OUTPATIENT_CLINIC_OR_DEPARTMENT_OTHER): Payer: PRIVATE HEALTH INSURANCE | Admitting: Cardiovascular Disease

## 2021-09-21 ENCOUNTER — Ambulatory Visit (INDEPENDENT_AMBULATORY_CARE_PROVIDER_SITE_OTHER): Payer: PRIVATE HEALTH INSURANCE | Admitting: Cardiology

## 2021-09-21 VITALS — BP 128/62 | HR 68 | Ht 64.5 in | Wt 158.0 lb

## 2021-09-21 DIAGNOSIS — R002 Palpitations: Secondary | ICD-10-CM

## 2021-09-21 MED ORDER — PROPRANOLOL HCL 20 MG PO TABS: 20.0000 mg | ORAL_TABLET | Freq: Two times a day (BID) | ORAL | 3 refills | Status: DC

## 2021-09-21 NOTE — Patient Instructions (Addendum)
Medication Instructions:  Stop Flecainide  Start Propranolol 20 mg two times a day Your physician recommends that you continue on your current medications as directed. Please refer to the Current Medication list given to you today. *If you need a refill on your cardiac medications before your next appointment, please call your pharmacy*  Lab Work: None. If you have labs (blood work) drawn today and your tests are completely normal, you will receive your results only by: [place] (if you have MyChart) OR A paper copy in the mail If you have any lab test that is abnormal or we need to change your treatment, we will call you to review the results.  Testing/Procedures: None.  Follow-Up: At [facility], you and your health needs are our priority.  As part of our continuing mission to provide you with exceptional heart care, we have created designated Provider Care Teams.  These Care Teams include your primary Cardiologist (physician) and Advanced Practice Providers (APPs -  Physician Assistants and Nurse Practitioners) who all work together to provide you with the care you need, when you need it.  Your physician wants you to follow-up in: 8 weeks with one of the following Advanced Practice Providers on your designated Care Team:    Tommye Standard, [place] Legrand Como "Jonni Sanger" Svensen, [place]   We recommend signing up for the patient portal called "MyChart".  Sign up information is provided on this After Visit Summary.  MyChart is used to connect with patients for Virtual Visits (Telemedicine).  Patients are able to view lab/test results, encounter notes, upcoming appointments, etc.  Non-urgent messages can be sent to your provider as well.   To learn more about what you can do with MyChart, go to [email or web address].    Any Other Special Instructions Will Be Listed Below (If Applicable).

## 2021-09-22 MED ORDER — PROPRANOLOL HCL 20 MG PO TABS: 20.0000 mg | ORAL_TABLET | Freq: Two times a day (BID) | ORAL | 3 refills | Status: DC

## 2021-10-05 ENCOUNTER — Ambulatory Visit: Payer: Self-pay | Admitting: Orthopedic Surgery

## 2021-10-05 NOTE — Pre-Procedure Instructions (Signed)
Surgical Instructions    Your procedure is scheduled on Monday, August 14th.  Report to [facility] Main Entrance "A" at 11:00 A.M., then check in with the Admitting office.  Call this number if you have problems the morning of surgery:  [phone]   If you have any questions prior to your surgery date call [phone]: Open Monday-Friday 8am-4pm    Remember:  Do not eat or drink after midnight the night before your surgery      Take these medicines the morning of surgery with A SIP OF WATER  ofloxacin (OCUFLOX) eye drops prednisoLONE acetate (PRED FORTE) eye drops propranolol (INDERAL)   If needed: acetaminophen (TYLENOL)  cyclobenzaprine (FLEXERIL)   As of today, STOP taking any Aspirin (unless otherwise instructed by your surgeon) Aleve, Naproxen, Ibuprofen, Motrin, Advil, Goody's, BC's, all herbal medications, fish oil, and all vitamins.                     Do NOT Smoke (Tobacco/Vaping) for 24 hours prior to your procedure.  If you use a CPAP at night, you may bring your mask/headgear for your overnight stay.   Contacts, glasses, piercing's, hearing aid's, dentures or partials may not be worn into surgery, please bring cases for these belongings.    For patients admitted to the hospital, discharge time will be determined by your treatment team.   Patients discharged the day of surgery will not be allowed to drive home, and someone needs to stay with them for 24 hours.  SURGICAL WAITING ROOM VISITATION Patients having surgery or a procedure may have no more than 2 support people in the waiting area - these visitors may rotate.   Children under the age of 64 must have an adult with them who is not the patient. If the patient needs to stay at the hospital during part of their recovery, the visitor guidelines for inpatient rooms apply. Pre-op nurse will coordinate an appropriate time for 1 support person to accompany patient in pre-op.  This support person may not rotate.    Please refer to the [facility] website for the visitor guidelines for Inpatients (after your surgery is over and you are in a regular room).    Special instructions:   [place]- Preparing For Surgery  Before surgery, you can play an important role. Because skin is not sterile, your skin needs to be as free of germs as possible. You can reduce the number of germs on your skin by washing with CHG (chlorahexidine gluconate) Soap before surgery.  CHG is an antiseptic cleaner which kills germs and bonds with the skin to continue killing germs even after washing.    Oral Hygiene is also important to reduce your risk of infection.  Remember - BRUSH YOUR TEETH THE MORNING OF SURGERY WITH YOUR REGULAR TOOTHPASTE  Please do not use if you have an allergy to CHG or antibacterial soaps. If your skin becomes reddened/irritated stop using the CHG.  Do not shave (including legs and underarms) for at least 48 hours prior to first CHG shower. It is OK to shave your face.  Please follow these instructions carefully.   Shower the NIGHT BEFORE SURGERY and the MORNING OF SURGERY  If you chose to wash your hair, wash your hair first as usual with your normal shampoo.  After you shampoo, rinse your hair and body thoroughly to remove the shampoo.  Use CHG Soap as you would any other liquid soap. You can apply CHG directly to  the skin and wash gently with a scrungie or a clean washcloth.   Apply the CHG Soap to your body ONLY FROM THE NECK DOWN.  Do not use on open wounds or open sores. Avoid contact with your eyes, ears, mouth and genitals (private parts). Wash Face and genitals (private parts)  with your normal soap.   Wash thoroughly, paying special attention to the area where your surgery will be performed.  Thoroughly rinse your body with warm water from the neck down.  DO NOT shower/wash with your normal soap after using and rinsing off the CHG Soap.  Pat yourself dry with a CLEAN TOWEL.  Wear  CLEAN PAJAMAS to bed the night before surgery  Place CLEAN SHEETS on your bed the night before your surgery  DO NOT SLEEP WITH PETS.   Day of Surgery: Take a shower with CHG soap. Do not wear jewelry or makeup Do not wear lotions, powders, perfumes, or deodorant. Do not shave 48 hours prior to surgery.   Do not bring valuables to the hospital. [facility] is not responsible for any belongings or valuables. Do not wear nail polish, gel polish, artificial nails, or any other type of covering on natural nails (fingers and toes) If you have artificial nails or gel coating that need to be removed by a nail salon, please have this removed prior to surgery. Artificial nails or gel coating may interfere with anesthesia's ability to adequately monitor your vital signs. Wear Clean/Comfortable clothing the morning of surgery Remember to brush your teeth WITH YOUR REGULAR TOOTHPASTE.   Please read over the following fact sheets that you were given.    If you received a COVID test during your pre-op visit  it is requested that you wear a mask when out in public, stay away from anyone that may not be feeling well and notify your surgeon if you develop symptoms. If you have been in contact with anyone that has tested positive in the last 10 days please notify you surgeon.

## 2021-10-06 ENCOUNTER — Other Ambulatory Visit: Payer: Self-pay

## 2021-10-06 ENCOUNTER — Encounter (HOSPITAL_COMMUNITY)
Admission: RE | Admit: 2021-10-06 | Discharge: 2021-10-06 | Disposition: A | Discharge status: Home / Self Care | Payer: PRIVATE HEALTH INSURANCE | Source: Ambulatory Visit | Attending: Orthopedic Surgery | Admitting: Orthopedic Surgery

## 2021-10-06 ENCOUNTER — Encounter (HOSPITAL_COMMUNITY): Payer: Self-pay

## 2021-10-06 VITALS — BP 125/87 | HR 66 | Temp 98.4°F | Resp 17 | Ht 64.0 in | Wt 155.8 lb

## 2021-10-06 DIAGNOSIS — I10 Essential (primary) hypertension: Secondary | ICD-10-CM | POA: Diagnosis not present

## 2021-10-06 DIAGNOSIS — I340 Nonrheumatic mitral (valve) insufficiency: Secondary | ICD-10-CM | POA: Diagnosis not present

## 2021-10-06 DIAGNOSIS — I471 Supraventricular tachycardia: Secondary | ICD-10-CM | POA: Insufficient documentation

## 2021-10-06 DIAGNOSIS — Z01818 Encounter for other preprocedural examination: Secondary | ICD-10-CM

## 2021-10-06 DIAGNOSIS — I493 Ventricular premature depolarization: Secondary | ICD-10-CM | POA: Diagnosis not present

## 2021-10-06 DIAGNOSIS — Z01812 Encounter for preprocedural laboratory examination: Secondary | ICD-10-CM | POA: Diagnosis present

## 2021-10-06 DIAGNOSIS — R002 Palpitations: Secondary | ICD-10-CM | POA: Insufficient documentation

## 2021-10-06 HISTORY — DX: Family history of other specified conditions: Z84.89

## 2021-10-06 HISTORY — DX: Attention-deficit hyperactivity disorder, unspecified type: F90.9

## 2021-10-06 LAB — BASIC METABOLIC PANEL
Anion gap: 8 (ref 5–15)
BUN: 18 mg/dL (ref 6–20)
CO2: 24 mmol/L (ref 22–32)
Calcium: 9.5 mg/dL (ref 8.9–10.3)
Chloride: 106 mmol/L (ref 98–111)
Creatinine, Ser: 0.8 mg/dL (ref 0.44–1.00)
GFR, Estimated: >60 mL/min (ref >60)
Glucose, Bld: 94 mg/dL (ref 70–99)
Potassium: 4.4 mmol/L (ref 3.5–5.1)
Sodium: 138 mmol/L (ref 135–145)

## 2021-10-06 LAB — CBC
HCT: 46.3 % — ABNORMAL HIGH (ref 36.0–46.0)
Hemoglobin: 15.1 g/dL — ABNORMAL HIGH (ref 12.0–15.0)
MCH: 30.7 pg (ref 26.0–34.0)
MCHC: 32.6 g/dL (ref 30.0–36.0)
MCV: 94.1 fL (ref 80.0–100.0)
Platelets: 320 10*3/uL (ref 150–400)
RBC: 4.92 MIL/uL (ref 3.87–5.11)
RDW: 12.8 % (ref 11.5–15.5)
WBC: 4.7 10*3/uL (ref 4.0–10.5)
nRBC: 0 % (ref 0.0–0.2)

## 2021-10-06 LAB — SURGICAL PCR SCREEN
MRSA, PCR: NEGATIVE
Staphylococcus aureus: NEGATIVE

## 2021-10-06 NOTE — Progress Notes (Addendum)
PCP - Dr. Alysia Penna Cardiologist - Dr. Skeet Latch  PPM/ICD - denies   Chest x-ray - 01/08/20 EKG - 08/05/21 Stress Test -  CCTA 08/12/21 ECHO - 02/25/20 Cardiac Cath - denies  Sleep Study - denies  DM- denies  ASA/Blood Thinner Instructions: n/a   ERAS Protcol - no, NPO   COVID TEST- n/a   Anesthesia review: yes, cardiac hx  Patient denies shortness of breath, fever, cough and chest pain at PAT appointment   All instructions explained to the patient, with a verbal understanding of the material. Patient agrees to go over the instructions while at home for a better understanding. The opportunity to ask questions was provided.

## 2021-10-07 NOTE — Progress Notes (Signed)
Anesthesia Chart Review:  Follows with cardiology for history of HTN, SVT, palpitations, PE (after breast reduction surgery in 2000).  Echo 02/2020 showed EF 70 to 75%, mild MR.  Event monitor 03/2021 showed 6 runs of SVT, fastest at a rate of 193, longest episode 8 beats, rare PACs and PVCs.  Coronary CT 08/2021 with a calcium score of 0.  Last seen by Dr. Quentin Ore 09/21/2021.  Per note, he did not feel she had any sort of significant underlying cardiac disease process.  Symptoms likely exacerbated by anxiety and heightened cardiac awareness.  Recommended trial of propanolol for PVCs.  Preop labs reviewed, unremarkable.  EKG 08/05/2021: NSR.  Rate 76.  Nonspecific T wave abnormality.  Coronary CT 08/12/2021: IMPRESSION: 1. Calcium score 0   2.  Normal right dominant coronary arteries   3.  Normal ascending thoracic aorta 2.9 cm  7-day event monitor 04/05/2021: Quality: Fair.  Baseline artifact. Predominant rhythm:  Average heart rate: 79 bpm Max heart rate: 193 bpm Min heart rate: 47 bpm Pauses >2.5 seconds: None   6 runs of SVT.  The fastest was at a rate of 193 bpm.  Longest episode was 8 beats. Rare PACs and PVCs  TTE 02/25/2020:  1. Left ventricular ejection fraction, by estimation, is 70 to 75%. The  left ventricle has hyperdynamic function. The left ventricle has no  regional wall motion abnormalities. Left ventricular diastolic parameters  are indeterminate.   2. Right ventricular systolic function is normal. The right ventricular  size is normal. There is normal pulmonary artery systolic pressure. The  estimated right ventricular systolic pressure is 09.2 mmHg.   3. The mitral valve is grossly normal. Mild mitral valve regurgitation.   4. The aortic valve is tricuspid. Aortic valve regurgitation is not  visualized.   5. The inferior vena cava is dilated in size with >50% respiratory  variability, suggesting right atrial pressure of 8 mmHg.     Wynonia Musty [facility] [facility]/Anesthesiology Phone [phone] 10/07/2021 4:32 PM

## 2021-10-07 NOTE — Anesthesia Preprocedure Evaluation (Addendum)
Anesthesia Evaluation  Patient identified by MRN, date of birth, ID band Patient awake    Reviewed: Allergy & Precautions, NPO status , Patient's Chart, lab work & pertinent test results  History of Anesthesia Complications Negative for: history of anesthetic complications  Airway Mallampati: II  TM Distance: >3 FB Neck ROM: Full    Dental  (+) Dental Advisory Given, Teeth Intact   Pulmonary PE   Pulmonary exam normal        Cardiovascular hypertension, Normal cardiovascular exam+ dysrhythmias Supra Ventricular Tachycardia      Neuro/Psych  Headaches, Anxiety Depression    GI/Hepatic negative GI ROS, Neg liver ROS,   Endo/Other  negative endocrine ROS  Renal/GU negative Renal ROS  negative genitourinary   Musculoskeletal negative musculoskeletal ROS (+)   Abdominal   Peds  Hematology negative hematology ROS (+)   Anesthesia Other Findings   Reproductive/Obstetrics                             Anesthesia Physical Anesthesia Plan  ASA: 2  Anesthesia Plan: General   Post-op Pain Management: Ofirmev IV (intra-op)*, Toradol IV (intra-op)*, Ketamine IV*, Precedex and Dilaudid IV   Induction: Intravenous  PONV Risk Score and Plan: 3 and Ondansetron, Dexamethasone, Treatment may vary due to age or medical condition and Midazolam  Airway Management Planned: Oral ETT  Additional Equipment: None  Intra-op Plan:   Post-operative Plan: Extubation in OR  Informed Consent: I have reviewed the patients History and Physical, chart, labs and discussed the procedure including the risks, benefits and alternatives for the proposed anesthesia with the patient or authorized representative who has indicated his/her understanding and acceptance.     Dental advisory given  Plan Discussed with:   Anesthesia Plan Comments: (PAT note by Karoline Caldwell, PA-C: Follows with cardiology for history of HTN,  SVT, palpitations, PE (after breast reduction surgery in 2000).  Echo 02/2020 showed EF 70 to 75%, mild MR.  Event monitor 03/2021 showed 6 runs of SVT, fastest at a rate of 193, longest episode 8 beats, rare PACs and PVCs.  Coronary CT 08/2021 with a calcium score of 0.  Last seen by Dr. Quentin Ore 09/21/2021.  Per note, he did not feel she had any sort of significant underlying cardiac disease process.  Symptoms likely exacerbated by anxiety and heightened cardiac awareness.  Recommended trial of propanolol for PVCs.  Preop labs reviewed, unremarkable.  EKG 08/05/2021: NSR.  Rate 76.  Nonspecific T wave abnormality.  Coronary CT 08/12/2021: IMPRESSION: 1. Calcium score 0  2. Normal right dominant coronary arteries  3. Normal ascending thoracic aorta 2.9 cm  7-day event monitor 04/05/2021: Quality: Fair. Baseline artifact. Predominant rhythm:  Average heart rate: 79 bpm Max heart rate: 193 bpm Min heart rate: 47 bpm Pauses >2.5 seconds: None  6 runs of SVT. The fastest was at a rate of 193 bpm. Longest episode was 8 beats. Rare PACs and PVCs  TTE 02/25/2020: 1. Left ventricular ejection fraction, by estimation, is 70 to 75%. The  left ventricle has hyperdynamic function. The left ventricle has no  regional wall motion abnormalities. Left ventricular diastolic parameters  are indeterminate.  2. Right ventricular systolic function is normal. The right ventricular  size is normal. There is normal pulmonary artery systolic pressure. The  estimated right ventricular systolic pressure is 30.1 mmHg.  3. The mitral valve is grossly normal. Mild mitral valve regurgitation.  4. The aortic valve is  tricuspid. Aortic valve regurgitation is not  visualized.  5. The inferior vena cava is dilated in size with >50% respiratory  variability, suggesting right atrial pressure of 8 mmHg.  )       Anesthesia Quick Evaluation

## 2021-10-17 ENCOUNTER — Ambulatory Visit (HOSPITAL_COMMUNITY): Payer: PRIVATE HEALTH INSURANCE

## 2021-10-17 ENCOUNTER — Ambulatory Visit (HOSPITAL_COMMUNITY): Payer: PRIVATE HEALTH INSURANCE | Admitting: Vascular Surgery

## 2021-10-17 ENCOUNTER — Other Ambulatory Visit: Payer: Self-pay

## 2021-10-17 ENCOUNTER — Ambulatory Visit (HOSPITAL_BASED_OUTPATIENT_CLINIC_OR_DEPARTMENT_OTHER): Payer: PRIVATE HEALTH INSURANCE | Admitting: Anesthesiology

## 2021-10-17 ENCOUNTER — Observation Stay (HOSPITAL_COMMUNITY)
Admission: RE | Admit: 2021-10-17 | Discharge: 2021-10-18 | Disposition: A | Discharge status: Home / Self Care | Payer: PRIVATE HEALTH INSURANCE | Source: Ambulatory Visit | Attending: Orthopedic Surgery | Admitting: Orthopedic Surgery

## 2021-10-17 ENCOUNTER — Encounter (HOSPITAL_COMMUNITY)
Admission: RE | Disposition: A | Discharge status: Home / Self Care | Payer: Self-pay | Source: Ambulatory Visit | Attending: Orthopedic Surgery

## 2021-10-17 ENCOUNTER — Encounter (HOSPITAL_COMMUNITY): Payer: Self-pay | Admitting: Orthopedic Surgery

## 2021-10-17 DIAGNOSIS — M4722 Other spondylosis with radiculopathy, cervical region: Secondary | ICD-10-CM

## 2021-10-17 DIAGNOSIS — Z86711 Personal history of pulmonary embolism: Secondary | ICD-10-CM | POA: Insufficient documentation

## 2021-10-17 DIAGNOSIS — Z8616 Personal history of COVID-19: Secondary | ICD-10-CM | POA: Diagnosis not present

## 2021-10-17 DIAGNOSIS — Z79899 Other long term (current) drug therapy: Secondary | ICD-10-CM | POA: Insufficient documentation

## 2021-10-17 DIAGNOSIS — M5412 Radiculopathy, cervical region: Secondary | ICD-10-CM | POA: Diagnosis present

## 2021-10-17 DIAGNOSIS — M4802 Spinal stenosis, cervical region: Secondary | ICD-10-CM | POA: Insufficient documentation

## 2021-10-17 HISTORY — PX: ANTERIOR CERVICAL DECOMP/DISCECTOMY FUSION: SHX1161

## 2021-10-17 SURGERY — ANTERIOR CERVICAL DECOMPRESSION/DISCECTOMY FUSION 2 LEVELS
Anesthesia: General | Site: Spine Cervical

## 2021-10-17 MED ORDER — HYDROMORPHONE HCL 1 MG/ML IJ SOLN: 0.5000 mg | INTRAMUSCULAR | Status: DC | PRN

## 2021-10-17 MED ORDER — PHENOL 1.4 % MT LIQD: 1.0000 | OROMUCOSAL | Status: DC | PRN

## 2021-10-17 MED ORDER — ROCURONIUM BROMIDE 10 MG/ML (PF) SYRINGE
PREFILLED_SYRINGE | INTRAVENOUS | Status: DC | PRN
  Administered 2021-10-17: 100 mg via INTRAVENOUS

## 2021-10-17 MED ORDER — SODIUM CHLORIDE 0.9 % IV SOLN
INTRAVENOUS | Status: DC
Start: 2021-10-17 — End: 2021-10-18

## 2021-10-17 MED ORDER — ONDANSETRON HCL 4 MG/2ML IJ SOLN
INTRAMUSCULAR | Status: AC
Start: 2021-10-17 — End: ?
  Filled 2021-10-17: qty 2

## 2021-10-17 MED ORDER — AMISULPRIDE (ANTIEMETIC) 5 MG/2ML IV SOLN: 10.0000 mg | Freq: Once | INTRAVENOUS | Status: DC | PRN

## 2021-10-17 MED ORDER — MIDAZOLAM HCL 2 MG/2ML IJ SOLN
INTRAMUSCULAR | Status: AC
  Filled 2021-10-17: qty 2

## 2021-10-17 MED ORDER — ONDANSETRON HCL 4 MG PO TABS: 4.0000 mg | ORAL_TABLET | Freq: Four times a day (QID) | ORAL | Status: DC | PRN

## 2021-10-17 MED ORDER — DEXMEDETOMIDINE HCL IN NACL 200 MCG/50ML IV SOLN
INTRAVENOUS | Status: DC | PRN
  Administered 2021-10-17 (×2): 4 ug via INTRAVENOUS
  Administered 2021-10-17 (×3): 8 ug via INTRAVENOUS

## 2021-10-17 MED ORDER — OXYCODONE HCL 5 MG/5ML PO SOLN: 5.0000 mg | Freq: Once | ORAL | Status: AC | PRN

## 2021-10-17 MED ORDER — LIDOCAINE 2% (20 MG/ML) 5 ML SYRINGE
INTRAMUSCULAR | Status: DC | PRN
  Administered 2021-10-17: 60 mg via INTRAVENOUS

## 2021-10-17 MED ORDER — ACETAMINOPHEN 650 MG RE SUPP: 650.0000 mg | RECTAL | Status: DC | PRN

## 2021-10-17 MED ORDER — METHOCARBAMOL 500 MG PO TABS
500.0000 mg | ORAL_TABLET | Freq: Four times a day (QID) | ORAL | Status: DC | PRN
  Administered 2021-10-17 – 2021-10-18 (×2): 500 mg via ORAL
  Filled 2021-10-17 (×2): qty 1

## 2021-10-17 MED ORDER — HYDROMORPHONE HCL 1 MG/ML IJ SOLN
INTRAMUSCULAR | Status: AC
  Filled 2021-10-17: qty 1

## 2021-10-17 MED ORDER — CEFAZOLIN SODIUM-DEXTROSE 2-4 GM/100ML-% IV SOLN
2.0000 g | INTRAVENOUS | Status: AC
  Administered 2021-10-17: 2 g via INTRAVENOUS
  Filled 2021-10-17: qty 100

## 2021-10-17 MED ORDER — SUGAMMADEX SODIUM 200 MG/2ML IV SOLN
INTRAVENOUS | Status: DC | PRN
  Administered 2021-10-17: 200 mg via INTRAVENOUS

## 2021-10-17 MED ORDER — METHOCARBAMOL 500 MG PO TABS: 500.0000 mg | ORAL_TABLET | Freq: Three times a day (TID) | ORAL | 0 refills | Status: AC | PRN

## 2021-10-17 MED ORDER — ORAL CARE MOUTH RINSE: 15.0000 mL | Freq: Once | OROMUCOSAL | Status: AC

## 2021-10-17 MED ORDER — PREDNISOLONE ACETATE 1 % OP SUSP
1.0000 [drp] | Freq: Four times a day (QID) | OPHTHALMIC | Status: DC
Start: 2021-10-17 — End: 2021-10-18
  Administered 2021-10-17: 1 [drp] via OPHTHALMIC
  Filled 2021-10-17: qty 5

## 2021-10-17 MED ORDER — OXYCODONE HCL 5 MG PO TABS
ORAL_TABLET | ORAL | Status: AC
  Filled 2021-10-17: qty 1

## 2021-10-17 MED ORDER — CEFAZOLIN SODIUM-DEXTROSE 1-4 GM/50ML-% IV SOLN
1.0000 g | Freq: Three times a day (TID) | INTRAVENOUS | Status: AC
  Administered 2021-10-17 – 2021-10-18 (×2): 1 g via INTRAVENOUS
  Filled 2021-10-17 (×2): qty 50

## 2021-10-17 MED ORDER — LACTATED RINGERS IV SOLN: INTRAVENOUS | Status: DC | PRN

## 2021-10-17 MED ORDER — DEXAMETHASONE 4 MG PO TABS: 4.0000 mg | ORAL_TABLET | Freq: Four times a day (QID) | ORAL | Status: DC

## 2021-10-17 MED ORDER — LIDOCAINE 2% (20 MG/ML) 5 ML SYRINGE
INTRAMUSCULAR | Status: AC
  Filled 2021-10-17: qty 5

## 2021-10-17 MED ORDER — ACETAMINOPHEN 10 MG/ML IV SOLN
1000.0000 mg | Freq: Once | INTRAVENOUS | Status: AC
  Administered 2021-10-17: 1000 mg via INTRAVENOUS

## 2021-10-17 MED ORDER — PROPOFOL 500 MG/50ML IV EMUL
INTRAVENOUS | Status: DC | PRN
  Administered 2021-10-17: 150 ug/kg/min via INTRAVENOUS

## 2021-10-17 MED ORDER — DEXAMETHASONE SODIUM PHOSPHATE 10 MG/ML IJ SOLN
INTRAMUSCULAR | Status: AC
  Filled 2021-10-17: qty 1

## 2021-10-17 MED ORDER — FLEET ENEMA 7-19 GM/118ML RE ENEM: 1.0000 | ENEMA | Freq: Once | RECTAL | Status: DC | PRN

## 2021-10-17 MED ORDER — OXYCODONE HCL 5 MG PO TABS: 5.0000 mg | ORAL_TABLET | ORAL | Status: DC | PRN

## 2021-10-17 MED ORDER — OFLOXACIN 0.3 % OP SOLN
1.0000 [drp] | Freq: Four times a day (QID) | OPHTHALMIC | Status: DC
  Filled 2021-10-17: qty 5

## 2021-10-17 MED ORDER — PROPRANOLOL HCL 20 MG PO TABS
20.0000 mg | ORAL_TABLET | Freq: Two times a day (BID) | ORAL | Status: DC
  Administered 2021-10-17 – 2021-10-18 (×2): 20 mg via ORAL
  Filled 2021-10-17 (×3): qty 1

## 2021-10-17 MED ORDER — OXYCODONE-ACETAMINOPHEN 10-325 MG PO TABS: 1.0000 | ORAL_TABLET | Freq: Four times a day (QID) | ORAL | 0 refills | Status: AC | PRN

## 2021-10-17 MED ORDER — LACTATED RINGERS IV SOLN: INTRAVENOUS | Status: DC

## 2021-10-17 MED ORDER — THROMBIN 20000 UNITS EX SOLR
CUTANEOUS | Status: AC
  Filled 2021-10-17: qty 20000

## 2021-10-17 MED ORDER — SODIUM CHLORIDE 0.9% FLUSH
3.0000 mL | Freq: Two times a day (BID) | INTRAVENOUS | Status: DC
  Administered 2021-10-17: 3 mL via INTRAVENOUS

## 2021-10-17 MED ORDER — ONDANSETRON HCL 4 MG/2ML IJ SOLN
INTRAMUSCULAR | Status: DC | PRN
  Administered 2021-10-17: 4 mg via INTRAVENOUS

## 2021-10-17 MED ORDER — SURGIFLO WITH THROMBIN (HEMOSTATIC MATRIX KIT) OPTIME
TOPICAL | Status: DC | PRN
  Administered 2021-10-17: 1 via TOPICAL

## 2021-10-17 MED ORDER — DEXAMETHASONE SODIUM PHOSPHATE 10 MG/ML IJ SOLN
INTRAMUSCULAR | Status: DC | PRN
  Administered 2021-10-17: 10 mg via INTRAVENOUS

## 2021-10-17 MED ORDER — ONDANSETRON HCL 4 MG/2ML IJ SOLN
4.0000 mg | Freq: Four times a day (QID) | INTRAMUSCULAR | Status: DC | PRN
  Administered 2021-10-17: 4 mg via INTRAVENOUS
  Filled 2021-10-17: qty 2

## 2021-10-17 MED ORDER — ONDANSETRON HCL 4 MG PO TABS: 4.0000 mg | ORAL_TABLET | Freq: Three times a day (TID) | ORAL | 0 refills | Status: DC | PRN

## 2021-10-17 MED ORDER — ONDANSETRON HCL 4 MG/2ML IJ SOLN
4.0000 mg | Freq: Once | INTRAMUSCULAR | Status: DC | PRN
Start: 2021-10-17 — End: 2021-10-17

## 2021-10-17 MED ORDER — HYDROMORPHONE HCL 1 MG/ML IJ SOLN
0.2500 mg | INTRAMUSCULAR | Status: DC | PRN
  Administered 2021-10-17 (×6): 0.5 mg via INTRAVENOUS

## 2021-10-17 MED ORDER — FENTANYL CITRATE (PF) 250 MCG/5ML IJ SOLN
INTRAMUSCULAR | Status: AC
  Filled 2021-10-17: qty 5

## 2021-10-17 MED ORDER — OXYCODONE HCL 5 MG PO TABS
5.0000 mg | ORAL_TABLET | Freq: Once | ORAL | Status: AC | PRN
  Administered 2021-10-17: 5 mg via ORAL

## 2021-10-17 MED ORDER — MIDAZOLAM HCL 2 MG/2ML IJ SOLN
INTRAMUSCULAR | Status: DC | PRN
  Administered 2021-10-17: 2 mg via INTRAVENOUS

## 2021-10-17 MED ORDER — PROPOFOL 10 MG/ML IV BOLUS
INTRAVENOUS | Status: DC | PRN
  Administered 2021-10-17: 200 mg via INTRAVENOUS

## 2021-10-17 MED ORDER — ACETAMINOPHEN 325 MG PO TABS
650.0000 mg | ORAL_TABLET | ORAL | Status: DC | PRN
  Administered 2021-10-17 – 2021-10-18 (×3): 650 mg via ORAL
  Filled 2021-10-17 (×3): qty 2

## 2021-10-17 MED ORDER — BUPIVACAINE-EPINEPHRINE 0.25% -1:200000 IJ SOLN
INTRAMUSCULAR | Status: DC | PRN
  Administered 2021-10-17: 9 mL

## 2021-10-17 MED ORDER — MENTHOL 3 MG MT LOZG
1.0000 | LOZENGE | OROMUCOSAL | Status: DC | PRN
  Filled 2021-10-17: qty 9

## 2021-10-17 MED ORDER — DEXAMETHASONE SODIUM PHOSPHATE 4 MG/ML IJ SOLN
4.0000 mg | Freq: Four times a day (QID) | INTRAMUSCULAR | Status: DC
  Administered 2021-10-17 – 2021-10-18 (×3): 4 mg via INTRAVENOUS
  Filled 2021-10-17 (×3): qty 1

## 2021-10-17 MED ORDER — FENTANYL CITRATE (PF) 250 MCG/5ML IJ SOLN
INTRAMUSCULAR | Status: DC | PRN
  Administered 2021-10-17 (×3): 50 ug via INTRAVENOUS
  Administered 2021-10-17: 100 ug via INTRAVENOUS

## 2021-10-17 MED ORDER — CHLORHEXIDINE GLUCONATE 0.12 % MT SOLN
15.0000 mL | Freq: Once | OROMUCOSAL | Status: AC
  Administered 2021-10-17: 15 mL via OROMUCOSAL
  Filled 2021-10-17: qty 15

## 2021-10-17 MED ORDER — 0.9 % SODIUM CHLORIDE (POUR BTL) OPTIME
TOPICAL | Status: DC | PRN
  Administered 2021-10-17 (×2): 1000 mL

## 2021-10-17 MED ORDER — OXYCODONE HCL 5 MG PO TABS
10.0000 mg | ORAL_TABLET | ORAL | Status: DC | PRN
  Administered 2021-10-17 – 2021-10-18 (×4): 10 mg via ORAL
  Filled 2021-10-17 (×4): qty 2

## 2021-10-17 MED ORDER — POLYETHYLENE GLYCOL 3350 17 G PO PACK: 17.0000 g | PACK | Freq: Every day | ORAL | Status: DC | PRN

## 2021-10-17 MED ORDER — ROCURONIUM BROMIDE 10 MG/ML (PF) SYRINGE
PREFILLED_SYRINGE | INTRAVENOUS | Status: AC
  Filled 2021-10-17: qty 10

## 2021-10-17 MED ORDER — SODIUM CHLORIDE 0.9% FLUSH: 3.0000 mL | INTRAVENOUS | Status: DC | PRN

## 2021-10-17 MED ORDER — BUPIVACAINE-EPINEPHRINE (PF) 0.25% -1:200000 IJ SOLN
INTRAMUSCULAR | Status: AC
  Filled 2021-10-17: qty 30

## 2021-10-17 MED ORDER — PROPOFOL 10 MG/ML IV BOLUS
INTRAVENOUS | Status: AC
  Filled 2021-10-17: qty 20

## 2021-10-17 MED ORDER — AMISULPRIDE (ANTIEMETIC) 5 MG/2ML IV SOLN
INTRAVENOUS | Status: AC
  Filled 2021-10-17: qty 4

## 2021-10-17 MED ORDER — ACETAMINOPHEN 10 MG/ML IV SOLN
INTRAVENOUS | Status: AC
  Filled 2021-10-17: qty 100

## 2021-10-17 MED ORDER — THROMBIN 20000 UNITS EX SOLR: CUTANEOUS | Status: DC | PRN

## 2021-10-17 MED ORDER — METHOCARBAMOL 1000 MG/10ML IJ SOLN: 500.0000 mg | Freq: Four times a day (QID) | INTRAVENOUS | Status: DC | PRN

## 2021-10-17 SURGICAL SUPPLY — 70 items
AGENT HMST KT MTR STRL THRMB (HEMOSTASIS) ×1
BAG COUNTER SPONGE SURGICOUNT (BAG) ×2 IMPLANT
BAG SPNG CNTER NS LX DISP (BAG) ×1
BLADE CLIPPER SURG (BLADE) IMPLANT
BUR EGG ELITE 4.0 (BURR) IMPLANT
BUR MATCHSTICK NEURO 3.0 LAGG (BURR) IMPLANT
CABLE BIPOLOR RESECTION CORD (MISCELLANEOUS) ×2 IMPLANT
CANISTER SUCT 3000ML PPV (MISCELLANEOUS) ×2 IMPLANT
CLSR STERI-STRIP ANTIMIC 1/2X4 (GAUZE/BANDAGES/DRESSINGS) ×2 IMPLANT
COVER MAYO STAND STRL (DRAPES) ×6 IMPLANT
COVER SURGICAL LIGHT HANDLE (MISCELLANEOUS) ×4 IMPLANT
DEVICE ENDSKLTN IMPLANT SM 7MM (Cage) IMPLANT
DRAIN CHANNEL 15F RND FF W/TCR (WOUND CARE) IMPLANT
DRAPE C-ARM 42X72 X-RAY (DRAPES) ×2 IMPLANT
DRAPE POUCH INSTRU U-SHP 10X18 (DRAPES) ×2 IMPLANT
DRAPE SURG 17X23 STRL (DRAPES) ×2 IMPLANT
DRAPE U-SHAPE 47X51 STRL (DRAPES) ×2 IMPLANT
DRSG OPSITE POSTOP 3X4 (GAUZE/BANDAGES/DRESSINGS) ×2 IMPLANT
DRSG OPSITE POSTOP 4X6 (GAUZE/BANDAGES/DRESSINGS) ×1 IMPLANT
DURAPREP 26ML APPLICATOR (WOUND CARE) ×2 IMPLANT
ELECT COATED BLADE 2.86 ST (ELECTRODE) ×2 IMPLANT
ELECT PENCIL ROCKER SW 15FT (MISCELLANEOUS) ×2 IMPLANT
ELECT REM PT RETURN 9FT ADLT (ELECTROSURGICAL) ×2
ELECTRODE REM PT RTRN 9FT ADLT (ELECTROSURGICAL) ×1 IMPLANT
ENDOSKELETON IMPLANT SM 7MM (Cage) ×4 IMPLANT
GLOVE BIO SURGEON STRL SZ 6.5 (GLOVE) ×2 IMPLANT
GLOVE BIOGEL PI IND STRL 6.5 (GLOVE) ×1 IMPLANT
GLOVE BIOGEL PI IND STRL 8.5 (GLOVE) ×1 IMPLANT
GLOVE BIOGEL PI INDICATOR 6.5 (GLOVE) ×1
GLOVE BIOGEL PI INDICATOR 8.5 (GLOVE) ×1
GLOVE SS BIOGEL STRL SZ 8.5 (GLOVE) ×1 IMPLANT
GLOVE SUPERSENSE BIOGEL SZ 8.5 (GLOVE) ×1
GOWN STRL REUS W/ TWL LRG LVL3 (GOWN DISPOSABLE) ×1 IMPLANT
GOWN STRL REUS W/TWL 2XL LVL3 (GOWN DISPOSABLE) ×2 IMPLANT
GOWN STRL REUS W/TWL LRG LVL3 (GOWN DISPOSABLE) ×2
KIT BASIN OR (CUSTOM PROCEDURE TRAY) ×2 IMPLANT
KIT TURNOVER KIT B (KITS) ×2 IMPLANT
NDL SPNL 18GX3.5 QUINCKE PK (NEEDLE) ×1 IMPLANT
NEEDLE HYPO 22GX1.5 SAFETY (NEEDLE) ×2 IMPLANT
NEEDLE SPNL 18GX3.5 QUINCKE PK (NEEDLE) ×2 IMPLANT
NS IRRIG 1000ML POUR BTL (IV SOLUTION) ×2 IMPLANT
PACK ORTHO CERVICAL (CUSTOM PROCEDURE TRAY) ×2 IMPLANT
PACK UNIVERSAL I (CUSTOM PROCEDURE TRAY) ×2 IMPLANT
PAD ARMBOARD 7.5X6 YLW CONV (MISCELLANEOUS) ×4 IMPLANT
PATTIES SURGICAL .25X.25 (GAUZE/BANDAGES/DRESSINGS) ×2 IMPLANT
PIN DISTRACTION MAXCESS-C 14 (PIN) ×2 IMPLANT
PLATE ACP 1.6X38 2LVL (Plate) ×1 IMPLANT
POSITIONER HEAD DONUT 9IN (MISCELLANEOUS) ×2 IMPLANT
PUTTY BONE DBX 2.5 MIS (Bone Implant) ×1 IMPLANT
RESTRAINT LIMB HOLDER UNIV (RESTRAINTS) ×2 IMPLANT
SCREW ACP 3.5 X 13 S/D VARIA (Screw) ×4 IMPLANT
SCREW ACP 3.5X13 S/D VAR ANGLE (Screw) IMPLANT
SCREW ACP VA SD 3.5X15 (Screw) ×4 IMPLANT
SPONGE INTESTINAL PEANUT (DISPOSABLE) ×2 IMPLANT
SPONGE SURGIFOAM ABS GEL 100 (HEMOSTASIS) ×2 IMPLANT
SPONGE T-LAP 4X18 ~~LOC~~+RFID (SPONGE) ×5 IMPLANT
SURGIFLO W/THROMBIN 8M KIT (HEMOSTASIS) ×1 IMPLANT
SUT BONE WAX W31G (SUTURE) ×2 IMPLANT
SUT MNCRL AB 3-0 PS2 27 (SUTURE) ×2 IMPLANT
SUT SILK 2 0 (SUTURE)
SUT SILK 2-0 18XBRD TIE 12 (SUTURE) IMPLANT
SUT VIC AB 2-0 CT1 18 (SUTURE) ×2 IMPLANT
SYR BULB IRRIG 60ML STRL (SYRINGE) ×2 IMPLANT
SYR CONTROL 10ML LL (SYRINGE) ×2 IMPLANT
TAPE CLOTH 4X10 WHT NS (GAUZE/BANDAGES/DRESSINGS) ×2 IMPLANT
TAPE UMBILICAL COTTON 1/8X30 (MISCELLANEOUS) ×2 IMPLANT
TOWEL GREEN STERILE (TOWEL DISPOSABLE) ×2 IMPLANT
TOWEL GREEN STERILE FF (TOWEL DISPOSABLE) ×2 IMPLANT
WATER STERILE IRR 1000ML POUR (IV SOLUTION) ×2 IMPLANT
YANKAUER SUCT BULB TIP NO VENT (SUCTIONS) ×1 IMPLANT

## 2021-10-17 NOTE — Transfer of Care (Signed)
Immediate Anesthesia Transfer of Care Note  Patient: Philena A Issac  Procedure(s) Performed: ANTERIOR CERVICAL DISCECTOMY AND FUSION FOUR TO SIX (Spine Cervical)  Patient Location: PACU  Anesthesia Type:General  Level of Consciousness: awake and oriented  Airway & Oxygen Therapy: Patient connected to nasal cannula oxygen  Post-op Assessment: Report given to RN and Post -op Vital signs reviewed and stable  Post vital signs: Reviewed and stable  Last Vitals:  Vitals Value Taken Time  BP 116/82 10/17/21 1552  Temp    Pulse 88 10/17/21 1557  Resp 20 10/17/21 1557  SpO2 94 % 10/17/21 1557  Vitals shown include unvalidated device data.  Last Pain:  Vitals:   10/17/21 1138  TempSrc:   PainSc: 4          Complications: No notable events documented.

## 2021-10-17 NOTE — Brief Op Note (Signed)
10/17/2021  3:58 PM  PATIENT:  [name]  51 y.o. female  PRE-OPERATIVE DIAGNOSIS:  Cervical spondylotic radiculopathy  POST-OPERATIVE DIAGNOSIS:  Cervical spondylotic radiculopathy  PROCEDURE:  Procedure(s): ANTERIOR CERVICAL DISCECTOMY AND FUSION FOUR TO SIX (N/A)  SURGEON:  Surgeon(s) and Role:    Melina Schools, MD - Primary  PHYSICIAN ASSISTANT:   ASSISTANTS: none   ANESTHESIA:   general  EBL:  50 mL   BLOOD ADMINISTERED:none  DRAINS: none   LOCAL MEDICATIONS USED:  MARCAINE     SPECIMEN:  No Specimen  DISPOSITION OF SPECIMEN:  N/A  COUNTS:  YES  TOURNIQUET:  * No tourniquets in log *  DICTATION: .Dragon Dictation  PLAN OF CARE: Admit for overnight observation  PATIENT DISPOSITION:  PACU - hemodynamically stable.
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
## 2021-10-17 NOTE — Anesthesia Procedure Notes (Signed)
Procedure Name: Intubation Date/Time: 10/17/2021 1:17 PM  Performed by: Darletta Moll, CRNAPre-anesthesia Checklist: Patient identified, Emergency Drugs available, Suction available and Patient being monitored Patient Re-evaluated:Patient Re-evaluated prior to induction Oxygen Delivery Method: Circle system utilized Preoxygenation: Pre-oxygenation with 100% oxygen Induction Type: IV induction Ventilation: Mask ventilation without difficulty Laryngoscope Size: Glidescope and 3 Grade View: Grade I Tube type: Oral Tube size: 7.0 mm Number of attempts: 1 Airway Equipment and Method: Stylet and Video-laryngoscopy Placement Confirmation: ETT inserted through vocal cords under direct vision, positive ETCO2 and breath sounds checked- equal and bilateral Secured at: 21 cm Tube secured with: Tape Dental Injury: Teeth and Oropharynx as per pre-operative assessment  Comments: Inserted by Laurence Spates

## 2021-10-17 NOTE — Discharge Instructions (Signed)
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
## 2021-10-18 ENCOUNTER — Encounter (HOSPITAL_COMMUNITY): Payer: Self-pay | Admitting: Orthopedic Surgery

## 2021-10-18 DIAGNOSIS — M4722 Other spondylosis with radiculopathy, cervical region: Secondary | ICD-10-CM | POA: Diagnosis not present

## 2021-10-18 NOTE — Plan of Care (Signed)

## 2021-10-18 NOTE — Progress Notes (Signed)
Patient transported to her vehicle via wheelchair by volunteer for discharge home; moves all extremities well; in no acute distress nor complaints of pain nor discomfort; incision on her anterior neck with honeycomb dressing and is clean, dry and intact with Aspen collar on; room was checked for all her belongings; discharge instructions concerning her medications, incision care, follow up appointment and when to call the doctor as needed were all discussed with patient by RN and she verbalized understanding on the instructions given

## 2021-10-18 NOTE — Evaluation (Signed)
Occupational Therapy Evaluation Patient Details Name: ANNETH BRUNELL MRN: [id number] DOB: 07-08-1970 Today's Date: 10/18/2021   History of Present Illness Pt is a 51 y/o F s/p C4-5 ACDF, PMH includes ADHD, COVID-19, and SVT.   Clinical Impression   Pt independent at baseline with ADLs and functional mobility, lives with family who can assist at d/c. Pt currently near baseline, performing ADLs with supervision and transfers mod I. Pt educated on compensatory strategies for ADLs, precautions, brace wear, and log rolling technique, pt verbalized understanding and adheres to precautions well during session. Pt presenting with impairments listed below, however has no acute OT needs at this time. Will s/o. Recommend d/c home with family assistance.      Recommendations for follow up therapy are one component of a multi-disciplinary discharge planning process, led by the attending physician.  Recommendations may be updated based on patient status, additional functional criteria and insurance authorization.   Follow Up Recommendations  No OT follow up    Assistance Recommended at Discharge Set up Supervision/Assistance  Patient can return home with the following Assist for transportation;Assistance with cooking/housework;A little help with bathing/dressing/bathroom    Functional Status Assessment  Patient has had a recent decline in their functional status and demonstrates the ability to make significant improvements in function in a reasonable and predictable amount of time.  Equipment Recommendations  None recommended by OT    Recommendations for Other Services       Precautions / Restrictions Precautions Precautions: Cervical Precaution Booklet Issued: Yes (comment) Precaution Comments: educated pt on 3/3 precautions Required Braces or Orthoses: Cervical Brace Cervical Brace: Hard collar;At all times Restrictions Weight Bearing Restrictions: No      Mobility Bed Mobility Overal  bed mobility: Modified Independent             General bed mobility comments: able to verbalize log rolling technique, plans to sleep in recliner    Transfers Overall transfer level: Modified independent Equipment used: None                      Balance Overall balance assessment: Mild deficits observed, not formally tested                                         ADL either performed or assessed with clinical judgement   ADL Overall ADL's : At baseline                                       General ADL Comments: performs UB/LB dressing, toilet transfer, and standing grooming task with supervision     Vision Baseline Vision/History: 1 Wears glasses Vision Assessment?: No apparent visual deficits     Perception     Praxis      Pertinent Vitals/Pain Pain Assessment Pain Assessment: Faces Pain Score: 3  Faces Pain Scale: Hurts little more Pain Location: neck at incision Pain Descriptors / Indicators: Discomfort, Grimacing Pain Intervention(s): Limited activity within patient's tolerance, Monitored during session, Repositioned     Hand Dominance     Extremity/Trunk Assessment Upper Extremity Assessment Upper Extremity Assessment: Overall WFL for tasks assessed (reports R shoulder with limited mobility due to shoulder problems)   Lower Extremity Assessment Lower Extremity Assessment: Defer to PT evaluation   Cervical / Trunk Assessment Cervical /  Trunk Assessment: Normal   Communication Communication Communication: No difficulties   Cognition Arousal/Alertness: Awake/alert Behavior During Therapy: WFL for tasks assessed/performed, Anxious Overall Cognitive Status: Within Functional Limits for tasks assessed                                       General Comments  VSS on RA    Exercises     Shoulder Instructions      Home Living Family/patient expects to be discharged to:: Private  residence Living Arrangements: Spouse/significant other;Children Available Help at Discharge: Family;Available 24 hours/day Type of Home: House Home Access: Stairs to enter [employer] of Steps: 4   Home Layout: One level     Bathroom Shower/Tub: Teacher, early years/pre: Standard     Home Equipment: None          Prior Functioning/Environment Prior Level of Function : Independent/Modified Independent;Working/employed;Driving             Mobility Comments: no AD use ADLs Comments: ind with IADLs, works as Armed forces operational officer Problem List: Decreased range of motion;Impaired balance (sitting and/or standing)      OT Treatment/Interventions:      OT Goals(Current goals can be found in the care plan section) Acute Rehab OT Goals Patient Stated Goal: none stated OT Goal Formulation: With patient Time For Goal Achievement: 11/01/21 Potential to Achieve Goals: Good  OT Frequency:      Co-evaluation              AM-PAC OT "6 Clicks" Daily Activity     Outcome Measure Help from another person eating meals?: None Help from another person taking care of personal grooming?: None Help from another person toileting, which includes using toliet, bedpan, or urinal?: None Help from another person bathing (including washing, rinsing, drying)?: A Little Help from another person to put on and taking off regular upper body clothing?: None Help from another person to put on and taking off regular lower body clothing?: A Little 6 Click Score: 22   End of Session Equipment Utilized During Treatment: Cervical collar Nurse Communication: Mobility status  Activity Tolerance: Patient tolerated treatment well Patient left: in bed;with call bell/phone within reach  OT Visit Diagnosis: Muscle weakness (generalized) (M62.81)                Time: 3491-7915 OT Time Calculation (min): 28 min Charges:  OT General Charges $OT Visit: 1 Visit OT  Evaluation $OT Eval Low Complexity: 1 Low OT Treatments $Self Care/Home Management : 8-22 mins  Lynnda Child, OTD, OTR/L Acute Rehab [phone] - [place] 10/18/2021, 8:27 AM

## 2021-10-18 NOTE — Progress Notes (Signed)
    Subjective: Procedure(s) (LRB): ANTERIOR CERVICAL DISCECTOMY AND FUSION FOUR TO SIX (N/A) 1 Day Post-Op  Patient reports pain as 0 on 0-10 scale.  Reports decreased arm pain denies incisional neck pain   Positive void Negative bowel movement Positive flatus Negative chest pain or shortness of breath  Objective: Vital signs in last 24 hours: Temp:  [97.8 F (36.6 C)-98.9 F (37.2 C)] 98.9 F (37.2 C) (08/15 0500) Pulse Rate:  [64-90] 78 (08/15 0500) Resp:  [10-18] 18 (08/15 0500) BP: (108-126)/(64-88) 113/64 (08/15 0500) SpO2:  [92 %-100 %] 98 % (08/15 0500) Weight:  [70.3 kg] 70.3 kg (08/14 1122)  Intake/Output from previous day: 08/14 0701 - 08/15 0700 In: 1200 [I.V.:1200] Out: 50 [Blood:50]  Labs: No results for input(s): "WBC", "RBC", "HCT", "PLT" in the last 72 hours. No results for input(s): "NA", "K", "CL", "CO2", "BUN", "CREATININE", "GLUCOSE", "CALCIUM" in the last 72 hours. No results for input(s): "LABPT", "INR" in the last 72 hours.  Physical Exam: Neurologically intact ABD soft Intact pulses distally Incision: dressing C/D/I and no drainage Compartment soft Body mass index is 26.19 kg/m.  Assessment/Plan: Patient stable  xrays n/a Mobilization with physical therapy Encourage incentive spirometry Continue care  Patient is doing exceptionally well. Neuropathic arm pain is resolved.  She has mild neck pain secondary to the surgical procedure.   Discharge to home today.  She will follow-up with me in 2 weeks for reevaluation.  Melina Schools, MD [facility] [phone]

## 2021-10-18 NOTE — Progress Notes (Signed)
PT Cancellation Note  Patient Details Name: [name] MRN: [id number] DOB: [birth year]   Cancelled Treatment:    Reason Eval/Treat Not Completed: PT screened, no needs identified, will sign off. Discussed pt case with OT who reports pt is currently mobilizing at a modified independent level and does not require a formal PT evaluation at this time. PT signing off. If needs change, please reconsult.     Thelma Comp 10/18/2021, 12:09 PM  Rolinda Roan, PT, DPT Acute Rehabilitation Services Secure Chat Preferred Office: [phone]

## 2021-10-18 NOTE — Discharge Summary (Signed)
Patient ID: MESCAL FLINCHBAUGH MRN: [id number] DOB/AGE: 07/19/1970 51 y.o.  Admit date: 10/17/2021 Discharge date: 10/18/2021  Admission Diagnoses:  Principal Problem:   Cervical radiculopathy   Discharge Diagnoses:  Principal Problem:   Cervical radiculopathy  status post Procedure(s): ANTERIOR CERVICAL DISCECTOMY AND FUSION FOUR TO SIX  Past Medical History:  Diagnosis Date   ADHD (attention deficit hyperactivity disorder)    pt does not take meds (diagnosed years ago)   Anemia    iron deficiency    Blood transfusion without reported diagnosis    Cardiac arrhythmia    Cellulitis    Chronic headaches    Chronic lumbar pain since 2013   sees Dr. Arvilla Market    Clotting disorder [facility])    PE prior to pregnancy.   Complication of anesthesia    Bp drops and alot of shaking after anesthesia   COVID-19 03/01/2020   Family history of adverse reaction to anesthesia    Pt thinks her mother had some cardiac arrhythmias with anesthesia   Interstitial cystitis    Palpitations 02/19/2018   PE (pulmonary embolism)    due to surgery   Pneumonia    PONV (postoperative nausea and vomiting)    Sepsis ([place])    due to surgery   Staph infection    SVT (supraventricular tachycardia) ([place]) 05/16/2021    Surgeries: Procedure(s): ANTERIOR CERVICAL DISCECTOMY AND FUSION FOUR TO SIX on 10/17/2021   Consultants:   Discharged Condition: Improved  Hospital Course: [name] is an 51 y.o. female who was admitted 10/17/2021 for operative treatment of Cervical radiculopathy. Patient failed conservative treatments (please see the history and physical for the specifics) and had severe unremitting pain that affects sleep, daily activities and work/hobbies. After pre-op clearance, the patient was taken to the operating room on 10/17/2021 and underwent  Procedure(s): ANTERIOR CERVICAL DISCECTOMY AND FUSION FOUR TO SIX.    Patient was given perioperative antibiotics:  Anti-infectives (From  admission, onward)    Start     Dose/Rate Route Frequency Ordered Stop   10/17/21 2100  ceFAZolin (ANCEF) IVPB 1 g/50 mL premix        1 g 100 mL/hr over 30 Minutes Intravenous Every 8 hours 10/17/21 1823 10/18/21 0534   10/17/21 1109  ceFAZolin (ANCEF) IVPB 2g/100 mL premix        2 g 200 mL/hr over 30 Minutes Intravenous 30 min pre-op 10/17/21 1109 10/17/21 1341        Patient was given sequential compression devices and early ambulation to prevent DVT.   Patient benefited maximally from hospital stay and there were no complications. At the time of discharge, the patient was urinating/moving their bowels without difficulty, tolerating a regular diet, pain is controlled with oral pain medications and they have been cleared by PT/OT.   Recent vital signs: Patient Vitals for the past 24 hrs:  BP Temp Temp src Pulse Resp SpO2 Height Weight  10/18/21 0500 113/64 98.9 F (37.2 C) Oral 78 18 98 % -- --  10/17/21 2351 114/66 98.5 F (36.9 C) Oral 65 18 100 % -- --  10/17/21 2026 108/75 98.5 F (36.9 C) Oral 68 18 97 % -- --  10/17/21 1818 115/73 98 F (36.7 C) -- 81 18 94 % -- --  10/17/21 1730 122/76 98.5 F (36.9 C) -- 90 18 96 % -- --  10/17/21 1707 109/71 -- -- 78 12 94 % -- --  10/17/21 1652 115/71 -- -- 76 13 92 % -- --  10/17/21 1637 118/74 -- -- 80 13 93 % -- --  10/17/21 1622 119/71 -- -- 81 16 95 % -- --  10/17/21 1607 119/73 -- -- 89 15 95 % -- --  10/17/21 1552 116/82 98.7 F (37.1 C) -- 86 10 94 % -- --  10/17/21 1122 126/88 97.8 F (36.6 C) Oral 64 18 99 % 5' 4.5" (1.638 m) 70.3 kg     Recent laboratory studies: No results for input(s): "WBC", "HGB", "HCT", "PLT", "NA", "K", "CL", "CO2", "BUN", "CREATININE", "GLUCOSE", "INR", "CALCIUM" in the last 72 hours.  Invalid input(s): "PT", "2"   Discharge Medications:   Allergies as of 10/18/2021   No Known Allergies      Medication List     STOP taking these medications    acetaminophen 500 MG tablet Commonly  known as: TYLENOL   acetaminophen 650 MG CR tablet Commonly known as: TYLENOL   B-12 Compliance Injection 1000 MCG/ML Kit Generic drug: Cyanocobalamin   cyclobenzaprine 5 MG tablet Commonly known as: FLEXERIL   gabapentin 300 MG capsule Commonly known as: NEURONTIN   ibuprofen 800 MG tablet Commonly known as: ADVIL   Recruitment consultant Syringes 25G X 1" 3 ML Misc Generic drug: SYRINGE-NEEDLE (DISP) 3 ML   multivitamin with minerals Tabs tablet   Vitamin D (Ergocalciferol) 50000 units Caps   zolpidem 10 MG tablet Commonly known as: AMBIEN       TAKE these medications    GENTEAL OP Place 1 drop into both eyes at bedtime.   methocarbamol 500 MG tablet Commonly known as: ROBAXIN Take 1 tablet (500 mg total) by mouth every 8 (eight) hours as needed for up to 5 days for muscle spasms.   ofloxacin 0.3 % ophthalmic solution Commonly known as: OCUFLOX Place 1 drop into the right eye 4 (four) times daily.   ondansetron 4 MG tablet Commonly known as: Zofran Take 1 tablet (4 mg total) by mouth every 8 (eight) hours as needed for nausea or vomiting.   oxyCODONE-acetaminophen 10-325 MG tablet Commonly known as: Percocet Take 1 tablet by mouth every 6 (six) hours as needed for up to 5 days for pain.   prednisoLONE acetate 1 % ophthalmic suspension Commonly known as: PRED FORTE Place 1 drop into the right eye 4 (four) times daily.   propranolol 20 MG tablet Commonly known as: INDERAL Take 1 tablet (20 mg total) by mouth 2 (two) times daily.        Diagnostic Studies: DG Cervical Spine 2 or 3 views  Result Date: 10/17/2021 CLINICAL DATA:  ANTERIOR CERVICAL DISCECTOMY AND FUSION FOUR TO SIX EXAM: CERVICAL SPINE - 2-3 VIEW COMPARISON:  None Available. FINDINGS: Intraoperative inter cervical discectomy and fusion at the C4 through C6 levels. 2 low resolution intraoperative spot views of the cervical spine were obtained. No fracture visible on the limited views. Endotracheal  tube partially visualized. Total fluoroscopy time: 39 seconds Total radiation dose: 2.46 mGy IMPRESSION: Intraoperative cervical discectomy and fusion at the C4 through C6 levels. Electronically Signed   By: Iven Finn M.D.   On: 10/17/2021 15:51   DG C-Arm 1-60 Min-No Report  Result Date: 10/17/2021 Fluoroscopy was utilized by the requesting physician.  No radiographic interpretation.   DG C-Arm 1-60 Min-No Report  Result Date: 10/17/2021 Fluoroscopy was utilized by the requesting physician.  No radiographic interpretation.    Discharge Instructions     Incentive spirometry RT   Complete by: As directed  Follow-up Information     Melina Schools, MD. Schedule an appointment as soon as possible for a visit in 2 week(s).   Specialty: Orthopedic Surgery Why: If symptoms worsen, For suture removal, For wound re-check Contact information: [address] [place] [address] [phone]                 Discharge Plan:  discharge to home  Disposition: Patient is tolerating regular diet.  Incision site is clean dry and intact there is no swelling or difficulty breathing.  Overall she has noted significant improvement in her preoperative neuropathic arm pain and dysesthesias.   Discharge to home today.  Discharge instructions have been provided as have appropriate medications: Percocet, Robaxin, and Zofran.  Patient will be seen in my office for routine follow-up in 2 weeks.  If any issues or problems arise she knows to contact me to have them addressed.    Signed: Dahlia Bailiff for Dr. Melina Schools [facility] [phone] 10/18/2021, 7:34 AM

## 2021-10-19 NOTE — Anesthesia Postprocedure Evaluation (Signed)
Anesthesia Post Note  Patient: [name]  Procedure(s) Performed: ANTERIOR CERVICAL DISCECTOMY AND FUSION FOUR TO SIX (Spine Cervical)     Patient location during evaluation: PACU Anesthesia Type: General Level of consciousness: awake and alert Pain management: pain level controlled Vital Signs Assessment: post-procedure vital signs reviewed and stable Respiratory status: spontaneous breathing, nonlabored ventilation, respiratory function stable and patient connected to nasal cannula oxygen Cardiovascular status: blood pressure returned to baseline and stable Postop Assessment: no apparent nausea or vomiting Anesthetic complications: no   No notable events documented.  Last Vitals:  Vitals:   10/18/21 0500 10/18/21 0746  BP: 113/64 119/68  Pulse: 78 78  Resp: 18 16  Temp: 37.2 C 37.1 C  SpO2: 98% 97%    Last Pain:  Vitals:   10/18/21 0940  TempSrc:   PainSc: 3                  Tiajuana Amass

## 2021-10-24 ENCOUNTER — Ambulatory Visit: Payer: PRIVATE HEALTH INSURANCE | Admitting: Psychology

## 2021-11-08 ENCOUNTER — Encounter: Payer: Self-pay | Admitting: Family Medicine

## 2021-11-08 ENCOUNTER — Ambulatory Visit (INDEPENDENT_AMBULATORY_CARE_PROVIDER_SITE_OTHER): Payer: PRIVATE HEALTH INSURANCE | Admitting: Family Medicine

## 2021-11-08 VITALS — BP 110/74 | HR 75 | Temp 98.5°F | Ht 64.25 in | Wt 154.1 lb

## 2021-11-08 DIAGNOSIS — Z0000 Encounter for general adult medical examination without abnormal findings: Secondary | ICD-10-CM

## 2021-11-08 DIAGNOSIS — Z23 Encounter for immunization: Secondary | ICD-10-CM

## 2021-11-08 LAB — URINALYSIS, ROUTINE W REFLEX MICROSCOPIC
Bilirubin Urine: NEGATIVE
Hgb urine dipstick: NEGATIVE
Nitrite: NEGATIVE
Specific Gravity, Urine: 1.025 (ref 1.000–1.030)
Total Protein, Urine: NEGATIVE
Urine Glucose: NEGATIVE
Urobilinogen, UA: 0.2 (ref 0.0–1.0)
pH: 5.5 (ref 5.0–8.0)

## 2021-11-08 LAB — LIPID PANEL
Cholesterol: 213 mg/dL — ABNORMAL HIGH (ref 0–200)
HDL: 79.2 mg/dL (ref >39.00)
LDL Cholesterol: 124 mg/dL — ABNORMAL HIGH (ref 0–99)
NonHDL: 133.3
Total CHOL/HDL Ratio: 3
Triglycerides: 48 mg/dL (ref 0.0–149.0)
VLDL: 9.6 mg/dL (ref 0.0–40.0)

## 2021-11-08 LAB — TSH: TSH: 1.22 u[IU]/mL (ref 0.35–5.50)

## 2021-11-08 LAB — HEPATIC FUNCTION PANEL
ALT: 8 U/L (ref 0–35)
AST: 12 U/L (ref 0–37)
Albumin: 4.1 g/dL (ref 3.5–5.2)
Alkaline Phosphatase: 59 U/L (ref 39–117)
Bilirubin, Direct: 0.1 mg/dL (ref 0.0–0.3)
Total Bilirubin: 0.4 mg/dL (ref 0.2–1.2)
Total Protein: 7.2 g/dL (ref 6.0–8.3)

## 2021-11-08 MED ORDER — MIRTAZAPINE 15 MG PO TABS: 15.0000 mg | ORAL_TABLET | Freq: Every day | ORAL | 1 refills | Status: DC

## 2021-11-08 MED ORDER — LORAZEPAM 1 MG PO TABS: 1.0000 mg | ORAL_TABLET | Freq: Every day | ORAL | 1 refills | Status: DC

## 2021-11-08 NOTE — Progress Notes (Signed)
Subjective:    Patient ID: [name], female    DOB: 18-Dec-1970, 51 y.o.   MRN: [id number]  HPI Here for a well exam. She is still recovering from an anterior cervical discectomy and fusion of C4-C6 per Dr. Rolena Infante on 10-17-21. This went well and she is recovering nicely. Otherwise he main concern today is trouble sleeping. She has struggled with sleep for years and she is currently on Zolpidem. She says this seemed to help at first, but it really does not work at all now. She has trouble both with falling and with staying asleep. She has also tried Temazepam and Trazodone in the past, with poor results.    Review of Systems  Constitutional: Negative.   HENT: Negative.    Eyes: Negative.   Respiratory: Negative.    Cardiovascular: Negative.   Gastrointestinal: Negative.   Genitourinary:  Negative for decreased urine volume, difficulty urinating, dyspareunia, dysuria, enuresis, flank pain, frequency, hematuria, pelvic pain and urgency.  Musculoskeletal: Negative.   Skin: Negative.   Neurological: Negative.  Negative for headaches.  Psychiatric/Behavioral:  Positive for sleep disturbance.        Objective:   Physical Exam Constitutional:      General: She is not in acute distress.    Appearance: Normal appearance. She is well-developed.     Comments: She ears a cervical brace when driving and when moving around very much.   HENT:     Head: Normocephalic and atraumatic.     Right Ear: External ear normal.     Left Ear: External ear normal.     Nose: Nose normal.     Mouth/Throat:     Pharynx: No oropharyngeal exudate.  Eyes:     General: No scleral icterus.    Conjunctiva/sclera: Conjunctivae normal.     Pupils: Pupils are equal, round, and reactive to light.  Neck:     Thyroid: No thyromegaly.     Vascular: No JVD.     Comments: The left anterior neck surgical site is clean  Cardiovascular:     Rate and Rhythm: Normal rate and regular rhythm.     Heart sounds:  Normal heart sounds. No murmur heard.    No friction rub. No gallop.  Pulmonary:     Effort: Pulmonary effort is normal. No respiratory distress.     Breath sounds: Normal breath sounds. No wheezing or rales.  Chest:     Chest wall: No tenderness.  Abdominal:     General: Bowel sounds are normal. There is no distension.     Palpations: Abdomen is soft. There is no mass.     Tenderness: There is no abdominal tenderness. There is no guarding or rebound.  Musculoskeletal:        General: No tenderness. Normal range of motion.  Lymphadenopathy:     Cervical: No cervical adenopathy.  Skin:    General: Skin is warm and dry.     Findings: No erythema or rash.  Neurological:     Mental Status: She is alert and oriented to person, place, and time.     Cranial Nerves: No cranial nerve deficit.     Motor: No abnormal muscle tone.     Coordination: Coordination normal.     Deep Tendon Reflexes: Reflexes are normal and symmetric. Reflexes normal.  Psychiatric:        Behavior: Behavior normal.        Thought Content: Thought content normal.  Judgment: Judgment normal.           Assessment & Plan:  Well exam. We discussed diet and exercise. Get fasting labs. For sleep, she will stop Zolpidem and try taking both Lorazepam 1 mg and Mirtazepine 15 mg at bedtime. Report back in 3-4 weeks.  Alysia Penna, MD

## 2021-11-08 NOTE — Progress Notes (Signed)
Cardiology Office Note:    Date:  11/09/2021   ID:  [name], DOB 23-May-1970, MRN [id number]  PCP:  Laurey Morale, MD   [facility] Providers Cardiologist:  Skeet Latch, MD Electrophysiologist:  Vickie Epley, MD     Referring MD: Laurey Morale, MD   No chief complaint on file.   History of Present Illness:    [name] is a 51 y.o. female with a hx of PTSD, chronic migraines, prior PE and symptomatic anemia who is being seen today for follow up. She was first seen in 2019 for the evaluation of palpitations that started at the age of 38 or 12. She wore a monitor that showed rare PVCs and PACs. She followed up with [name] and was given metoprolol to take as needed. At the time she was anemic with a hemoglobin of 7.   At follow-up she was feeling better but continued to have daily palpitations. At times her heart raced to the 140s-150s. She was restarted on metoprolol. She followed up with Christen Bame, NP and had stopped taking her metoprolol because she didn't like the thought of being on a beta blocker. She continued to feel palpitations. She had a monitor that showed rare PACs and PVCs with up to 8 beat runs of SVT.   At her last appointment she complained of daily palpitations, sometimes with racing heart beats up to 160 bpm. She endorsed episodic dizzy spells. She was also feeling sluggish which she attributed to metoprolol. She was started on 50 mg flecainide BID. She was seen by Laurann Montana, NP 08/2021 for pre-op clearance for ACDF C5-7. She reported unchanged symptoms of palpitations and heart racing, so she was referred to EP for further management. She had a cardiac CTA 08/2021 which revealed a coronary calcium score of 0 and normal ascending thoracic aorta (2.9 cm). She saw Dr. Quentin Ore 09/2021. Her flecainide was stopped given the potential for off target effects and low burden of PVCs. She was trialed on 20 mg propranolol BID, with plans to trial  diltiazem if propranolol was not tolerated.  Today, she is now post ACDF C5-7. She is dealing with some post-surgical pains, but she continues to recover well. For two weeks she was unable to swallow, and she still has some dysphagia. She believes she did have some episodes of SVT during her surgery. Since starting propranolol she still has noticeable palpitations which may be a little less frequent. Overall, she states it is possible the propranolol has helped. However they are still occurring daily, and last long enough for her to feel. Her palpitations generally resolve after a few seconds. She continues to focus on drinking enough water, and she uses electrolyte drinks as well. Her diet consists of healthy fats and avoiding sugars. She denies any chest pain, shortness of breath, or peripheral edema. No lightheadedness, headaches, syncope, orthopnea, or PND.   Past Medical History:  Diagnosis Date   ADHD (attention deficit hyperactivity disorder)    pt does not take meds (diagnosed years ago)   Anemia    iron deficiency    Blood transfusion without reported diagnosis    Cardiac arrhythmia    Chronic headaches    Chronic lumbar pain since 2013   sees Dr. Arvilla Market    Clotting disorder [facility])    PE prior to pregnancy.   Complication of anesthesia    Bp drops and alot of shaking after anesthesia   COVID-19 03/01/2020  Family history of adverse reaction to anesthesia    Pt thinks her mother had some cardiac arrhythmias with anesthesia   Interstitial cystitis    Palpitations 02/19/2018   PE (pulmonary embolism)    due to surgery   PONV (postoperative nausea and vomiting)    Sepsis ([place])    due to surgery   Staph infection    SVT (supraventricular tachycardia) ([place]) 05/16/2021    Past Surgical History:  Procedure Laterality Date   ANTERIOR CERVICAL DECOMP/DISCECTOMY FUSION N/A 10/17/2021   Procedure: ANTERIOR CERVICAL DISCECTOMY AND FUSION FOUR TO SIX;  Surgeon: Melina Schools,  MD;  Location: [place];  Service: Orthopedics;  Laterality: N/A;   BREAST REDUCTION SURGERY     COLONOSCOPY  06/25/2019   per Dr. Havery Moros, adeomatous polyps, repeat in 3 yrs    [place]     with left ovary and mass removed   RADIOFREQUENCY ABLATION NERVES     lumbar spine, per Dr. Ace Gins    ROBOTIC ASSISTED LAPAROSCOPIC HYSTERECTOMY AND SALPINGECTOMY Bilateral 04/14/2020   Procedure: XI ROBOTIC ASSISTED TOTAL LAPAROSCOPIC HYSTERECTOMY AND SALPINGECTOMY;  Surgeon: Princess Bruins, MD;  Location: [place];  Service: Gynecology;  Laterality: Bilateral;   SHOULDER ARTHROSCOPY WITH SUBACROMIAL DECOMPRESSION Right 09/14/2020   Procedure: SHOULDER MANIPULATION UNDER ANESTHESIA, ARTHROSCOPIC LYSIS OF ADHESIONS AND SUBACROMIAL DECOMPRESSION;  Surgeon: Nicholes Stairs, MD;  Location: [place];  Service: Orthopedics;  Laterality: Right;  90MINS   TUBAL LIGATION     UPPER GASTROINTESTINAL ENDOSCOPY      Current Medications: Current Meds  Medication Sig   gabapentin (NEURONTIN) 300 MG capsule Take 300 mg by mouth at bedtime.   ibuprofen (ADVIL) 800 MG tablet Take 800 mg by mouth as needed for moderate pain.   LORazepam (ATIVAN) 1 MG tablet Take 1 tablet (1 mg total) by mouth at bedtime.   methocarbamol (ROBAXIN) 500 MG tablet Take 500 mg by mouth 3 (three) times daily as needed.   mirtazapine (REMERON) 15 MG tablet Take 1 tablet (15 mg total) by mouth at bedtime.   ondansetron (ZOFRAN) 4 MG tablet Take 1 tablet (4 mg total) by mouth every 8 (eight) hours as needed for nausea or vomiting.   oxyCODONE-acetaminophen (PERCOCET) 10-325 MG tablet Take 1 tablet by mouth 3 (three) times daily as needed.   Polyethyl Glycol-Propyl Glycol (SYSTANE FREE OP) Apply to eye.   propranolol (INDERAL) 20 MG tablet Take 1 tablet (20 mg total) by mouth 2 (two) times daily.     Allergies:   Patient has no known allergies.   Social History   Socioeconomic History    Marital status: Married    Spouse name: Not on file   Number of children: 3   Years of education: Not on file   Highest education level: Not on file  Occupational History   Occupation: Glass blower/designer  Tobacco Use   Smoking status: Never   Smokeless tobacco: Never  Vaping Use   Vaping Use: Never used  Substance and Sexual Activity   Alcohol use: Yes    Comment: glass of wine occasionally (maybe once a month)   Drug use: No   Sexual activity: Yes    Partners: Male    Birth control/protection: None    Comment: Tubal ligation  Other Topics Concern   Not on file  Social History Narrative   Lives at home with husband and 2 children   Right handed   Caffeine: 1-2 cups  of coffee per day   Social Determinants of Health   Financial Resource Strain: Not on file  Food Insecurity: Not on file  Transportation Needs: Not on file  Physical Activity: Not on file  Stress: Not on file  Social Connections: Not on file     Family History: The patient's family history includes COPD in her father; Cancer in her paternal aunt; Colon polyps in her maternal grandmother, mother, and paternal grandmother; Diabetes in her maternal grandfather, paternal grandfather, and sister; Heart Problems in her sister; Heart attack in her paternal grandfather and paternal grandmother; Heart defect in her mother and paternal grandmother; Heart disease in her maternal grandfather and paternal grandfather; Hypertension in her father, maternal grandfather, maternal grandmother, paternal grandfather, paternal grandmother, and sister; Irritable bowel syndrome in her mother and paternal aunt; Liver disease in her father. There is no history of Colon cancer, Esophageal cancer, Rectal cancer, or Stomach cancer.  ROS:   Please see the history of present illness.    (+) Palpitations (+) Myalgias (+) Dysphagia All other systems reviewed and are negative.  EKGs/Labs/Other Studies Reviewed:    The following studies were  reviewed today:  Cardiac CTA  08/12/2021: IMPRESSION: 1. Calcium score 0   2.  Normal right dominant coronary arteries   3.  Normal ascending thoracic aorta 2.9 cm  Monitor 04/2021: 7 Day Zio Monitor   Quality: Fair.  Baseline artifact. Predominant rhythm:  Average heart rate: 79 bpm Max heart rate: 193 bpm Min heart rate: 47 bpm Pauses >2.5 seconds: None   6 runs of SVT.  The fastest was at a rate of 193 bpm.  Longest episode was 8 beats. Rare PACs and PVCs   Echo 02/25/2020: 1. Left ventricular ejection fraction, by estimation, is 70 to 75%. The  left ventricle has hyperdynamic function. The left ventricle has no  regional wall motion abnormalities. Left ventricular diastolic parameters  are indeterminate.   2. Right ventricular systolic function is normal. The right ventricular  size is normal. There is normal pulmonary artery systolic pressure. The  estimated right ventricular systolic pressure is 81.8 mmHg.   3. The mitral valve is grossly normal. Mild mitral valve regurgitation.   4. The aortic valve is tricuspid. Aortic valve regurgitation is not  visualized.   5. The inferior vena cava is dilated in size with >50% respiratory  variability, suggesting right atrial pressure of 8 mmHg.    Monitor 02/21/2018: 7 Day Event Monitor   Quality: Fair.  Baseline artifact. Predominant rhythm: sinus  Average heart rate: 85 bpm Max heart rate: 152 bpm Min heart rate: 50 bpm   <1% PACs and PVCs noted  EKG:  EKG is personally reviewed. 11/09/2021: EKG was not ordered. 08/05/2021 Laurann Montana, NP):  NSR 76 bpm with no acute ST/T wave changes.  05/16/2021: EKG was not ordered. 11/05/2020: Sinus arrhythmia. Rate 68 bpm. 02/12/2018: sinus rhythm.  Rate 83 bpm.  Short PR interval. ?delta wave  Recent Labs: 05/16/2021: Magnesium 1.8 10/06/2021: BUN 18; Creatinine, Ser 0.80; Hemoglobin 15.1; Platelets 320; Potassium 4.4; Sodium 138 11/08/2021: ALT 8; TSH 1.22   Recent Lipid Panel     Component Value Date/Time   CHOL 213 (H) 11/08/2021 1124   TRIG 48.0 11/08/2021 1124   HDL 79.20 11/08/2021 1124   CHOLHDL 3 11/08/2021 1124   VLDL 9.6 11/08/2021 1124   LDLCALC 124 (H) 11/08/2021 1124   LDLCALC 89 09/17/2019 1554     Risk Assessment/Calculations:  Physical Exam:    Wt Readings from Last 3 Encounters:  11/09/21 156 lb 4.8 oz (70.9 kg)  11/08/21 154 lb 2 oz (69.9 kg)  10/17/21 155 lb (70.3 kg)     VS:  BP 112/80 (BP Location: Right Arm, Patient Position: Sitting, Cuff Size: Normal)   Pulse 73   Ht '5\' 4"'$  (1.626 m)   Wt 156 lb 4.8 oz (70.9 kg)   LMP 03/21/2020   SpO2 97%   BMI 26.83 kg/m  , BMI Body mass index is 26.83 kg/m. GENERAL:  Well appearing HEENT: Pupils equal round and reactive, fundi not visualized, oral mucosa unremarkable NECK:  No jugular venous distention, waveform within normal limits, carotid upstroke brisk and symmetric, no bruits, no thyromegaly; Wearing neck brace LUNGS:  Clear to auscultation bilaterally HEART:  RRR.  PMI not displaced or sustained,S1 and S2 within normal limits, no S3, no S4, no clicks, no rubs, no murmurs ABD:  Flat, positive bowel sounds normal in frequency in pitch, no bruits, no rebound, no guarding, no midline pulsatile mass, no hepatomegaly, no splenomegaly EXT:  2 plus pulses throughout, no edema, no cyanosis no clubbing SKIN:  No rashes no nodules NEURO:  Cranial nerves II through XII grossly intact, motor grossly intact throughout PSYCH:  Cognitively intact, oriented to person place and time   ASSESSMENT:    1. Essential hypertension   2. SVT (supraventricular tachycardia) (HCC)    PLAN:    SVT (supraventricular tachycardia) (HCC) Symptoms seem to be somewhat better with propranolol.  She does continue to have daily palpitations.  Unclear whether this is PACs/PVCs or her SVT.  She did not think the flecainide helped much.  Recommend continue with the propranolol.  She has follow-up scheduled  with Dr. Quentin Ore.  Given that she is seeing EP and her only issue was our rhythm related with normal coronaries, we will see her as needed.  Continue current regimen for now.        Disposition: FU with Kal Chait C. Oval Linsey, MD, [facility] - PRN.  Medication Adjustments/Labs and Tests Ordered: Current medicines are reviewed at length with the patient today.  Concerns regarding medicines are outlined above.   No orders of the defined types were placed in this encounter.  No orders of the defined types were placed in this encounter.  Patient Instructions  Medication Instructions:  Your physician recommends that you continue on your current medications as directed. Please refer to the Current Medication list given to you today.   Lab Work: NONE  Testing/Procedures: NONE   Follow-Up: AS NEEDED       I,Mathew Stumpf,acting as a Education administrator for [place], MD.,have documented all relevant documentation on the behalf of Skeet Latch, MD,as directed by  Skeet Latch, MD while in the presence of Skeet Latch, MD.  I, [place] Oval Linsey, MD have reviewed all documentation for this visit.  The documentation of the exam, diagnosis, procedures, and orders on 11/09/2021 are all accurate and complete.   Signed, Skeet Latch, MD  11/09/2021 4:56 PM    [facility]

## 2021-11-09 ENCOUNTER — Ambulatory Visit (INDEPENDENT_AMBULATORY_CARE_PROVIDER_SITE_OTHER): Payer: PRIVATE HEALTH INSURANCE | Admitting: Cardiovascular Disease

## 2021-11-09 ENCOUNTER — Encounter (HOSPITAL_BASED_OUTPATIENT_CLINIC_OR_DEPARTMENT_OTHER): Payer: Self-pay | Admitting: Cardiovascular Disease

## 2021-11-09 VITALS — BP 112/80 | HR 73 | Ht 64.0 in | Wt 156.3 lb

## 2021-11-09 DIAGNOSIS — I10 Essential (primary) hypertension: Secondary | ICD-10-CM

## 2021-11-09 DIAGNOSIS — I471 Supraventricular tachycardia: Secondary | ICD-10-CM | POA: Diagnosis not present

## 2021-11-09 NOTE — Assessment & Plan Note (Signed)
Symptoms seem to be somewhat better with propranolol.  She does continue to have daily palpitations.  Unclear whether this is PACs/PVCs or her SVT.  She did not think the flecainide helped much.  Recommend continue with the propranolol.  She has follow-up scheduled with Dr. Quentin Ore.  Given that she is seeing EP and her only issue was our rhythm related with normal coronaries, we will see her as needed.  Continue current regimen for now.

## 2021-11-09 NOTE — Patient Instructions (Signed)
Medication Instructions:  Your physician recommends that you continue on your current medications as directed. Please refer to the Current Medication list given to you today.  Lab Work: NONE   Testing/Procedures: NONE   Follow-Up: AS NEEDED  

## 2021-11-10 ENCOUNTER — Other Ambulatory Visit: Payer: Self-pay

## 2021-11-10 DIAGNOSIS — E538 Deficiency of other specified B group vitamins: Secondary | ICD-10-CM

## 2021-11-10 DIAGNOSIS — E559 Vitamin D deficiency, unspecified: Secondary | ICD-10-CM

## 2021-11-10 MED ORDER — CYANOCOBALAMIN 1000 MCG/ML IJ SOLN: INTRAMUSCULAR | 6 refills | Status: DC

## 2021-11-10 MED ORDER — VITAMIN D3 50 MCG (2000 UT) PO CAPS: ORAL_CAPSULE | ORAL | 5 refills | Status: DC

## 2021-11-10 NOTE — Telephone Encounter (Signed)
Please refill these for one year

## 2021-11-10 NOTE — Telephone Encounter (Signed)
Both Rx sent to pt pharmacy

## 2021-11-11 ENCOUNTER — Other Ambulatory Visit: Payer: Self-pay

## 2021-11-11 MED ORDER — VITAMIN D (ERGOCALCIFEROL) 1.25 MG (50000 UNIT) PO CAPS: 50000.0000 [IU] | ORAL_CAPSULE | ORAL | 3 refills | Status: DC

## 2021-11-11 MED ORDER — CIPROFLOXACIN HCL 500 MG PO TABS: 500.0000 mg | ORAL_TABLET | Freq: Two times a day (BID) | ORAL | 0 refills | Status: DC

## 2021-11-11 NOTE — Telephone Encounter (Signed)
I sent in the weekly 50,000 unit capsules to [facility]

## 2021-11-16 NOTE — Progress Notes (Signed)
PCP:  Laurey Morale, MD Primary Cardiologist: Skeet Latch, MD Electrophysiologist: Vickie Epley, MD   [name] is a 51 y.o. female seen today for Vickie Epley, MD for routine electrophysiology followup. Since last being seen in our clinic the patient reports doing about the same. She does feel more benefit from propanolol than flecainide. Had recent spinal surgery and palpitations seem additionally settled down on pain regimen.  she denies chest pain, dyspnea, PND, orthopnea, nausea, vomiting, dizziness, syncope, edema, weight gain, or early satiety.   Past Medical History:  Diagnosis Date   ADHD (attention deficit hyperactivity disorder)    pt does not take meds (diagnosed years ago)   Anemia    iron deficiency    Blood transfusion without reported diagnosis    Cardiac arrhythmia    Chronic headaches    Chronic lumbar pain since 2013   sees Dr. Arvilla Market    Clotting disorder [facility])    PE prior to pregnancy.   Complication of anesthesia    Bp drops and alot of shaking after anesthesia   COVID-19 03/01/2020   Family history of adverse reaction to anesthesia    Pt thinks her mother had some cardiac arrhythmias with anesthesia   Interstitial cystitis    Palpitations 02/19/2018   PE (pulmonary embolism)    due to surgery   PONV (postoperative nausea and vomiting)    Sepsis ([place])    due to surgery   Staph infection    SVT (supraventricular tachycardia) ([place]) 05/16/2021   Past Surgical History:  Procedure Laterality Date   ANTERIOR CERVICAL DECOMP/DISCECTOMY FUSION N/A 10/17/2021   Procedure: ANTERIOR CERVICAL DISCECTOMY AND FUSION FOUR TO SIX;  Surgeon: Melina Schools, MD;  Location: [place];  Service: Orthopedics;  Laterality: N/A;   BREAST REDUCTION SURGERY     COLONOSCOPY  06/25/2019   per Dr. Havery Moros, adeomatous polyps, repeat in 3 yrs    [place]     with left ovary and mass removed    RADIOFREQUENCY ABLATION NERVES     lumbar spine, per Dr. Ace Gins    ROBOTIC ASSISTED LAPAROSCOPIC HYSTERECTOMY AND SALPINGECTOMY Bilateral 04/14/2020   Procedure: XI ROBOTIC ASSISTED TOTAL LAPAROSCOPIC HYSTERECTOMY AND SALPINGECTOMY;  Surgeon: Princess Bruins, MD;  Location: [place];  Service: Gynecology;  Laterality: Bilateral;   SHOULDER ARTHROSCOPY WITH SUBACROMIAL DECOMPRESSION Right 09/14/2020   Procedure: SHOULDER MANIPULATION UNDER ANESTHESIA, ARTHROSCOPIC LYSIS OF ADHESIONS AND SUBACROMIAL DECOMPRESSION;  Surgeon: Nicholes Stairs, MD;  Location: [place];  Service: Orthopedics;  Laterality: Right;  90MINS   TUBAL LIGATION     UPPER GASTROINTESTINAL ENDOSCOPY      Current Outpatient Medications  Medication Sig Dispense Refill   ciprofloxacin (CIPRO) 500 MG tablet Take 1 tablet (500 mg total) by mouth 2 (two) times daily. 14 tablet 0   cyanocobalamin (VITAMIN B12) 1000 MCG/ML injection Inject 1 mL into skin weekly 1 mL 6   gabapentin (NEURONTIN) 300 MG capsule Take 300 mg by mouth at bedtime.     ibuprofen (ADVIL) 800 MG tablet Take 800 mg by mouth as needed for moderate pain.     LORazepam (ATIVAN) 1 MG tablet Take 1 tablet (1 mg total) by mouth at bedtime. 30 tablet 1   methocarbamol (ROBAXIN) 500 MG tablet Take 500 mg by mouth 3 (three) times daily as needed.     mirtazapine (REMERON) 15 MG tablet Take 1 tablet (15 mg total) by mouth at  bedtime. 30 tablet 1   ondansetron (ZOFRAN) 4 MG tablet Take 1 tablet (4 mg total) by mouth every 8 (eight) hours as needed for nausea or vomiting. 20 tablet 0   oxyCODONE-acetaminophen (PERCOCET) 10-325 MG tablet Take 1 tablet by mouth 3 (three) times daily as needed.     Polyethyl Glycol-Propyl Glycol (SYSTANE FREE OP) Apply to eye.     propranolol (INDERAL) 20 MG tablet Take 1 tablet (20 mg total) by mouth 2 (two) times daily. 180 tablet 3   Vitamin D, Ergocalciferol, (DRISDOL) 1.25 MG (50000 UNIT) CAPS capsule Take 1 capsule (50,000 Units total) by  mouth every 7 (seven) days. 12 capsule 3   No current facility-administered medications for this visit.    No Known Allergies  Social History   Socioeconomic History   Marital status: Married    Spouse name: Not on file   Number of children: 3   Years of education: Not on file   Highest education level: Not on file  Occupational History   Occupation: Glass blower/designer  Tobacco Use   Smoking status: Never   Smokeless tobacco: Never  Vaping Use   Vaping Use: Never used  Substance and Sexual Activity   Alcohol use: Yes    Comment: glass of wine occasionally (maybe once a month)   Drug use: No   Sexual activity: Yes    Partners: Male    Birth control/protection: None    Comment: Tubal ligation  Other Topics Concern   Not on file  Social History Narrative   Lives at home with husband and 2 children   Right handed   Caffeine: 1-2 cups of coffee per day   Social Determinants of Health   Financial Resource Strain: Not on file  Food Insecurity: Not on file  Transportation Needs: Not on file  Physical Activity: Not on file  Stress: Not on file  Social Connections: Not on file  Intimate Partner Violence: Not on file     Review of Systems: All other systems reviewed and are otherwise negative except as noted above.  Physical Exam: Vitals:   11/17/21 1208  BP: 108/82  Pulse: 71  SpO2: 99%  Weight: 156 lb 12.8 oz (71.1 kg)  Height: '5\' 4"'$  (1.626 m)    GEN- The patient is well appearing, alert and oriented x 3 today.   HEENT: normocephalic, atraumatic; sclera clear, conjunctiva pink; hearing intact; oropharynx clear; neck supple, no JVP Lymph- no cervical lymphadenopathy Lungs- Clear to ausculation bilaterally, normal work of breathing.  No wheezes, rales, rhonchi Heart- Regular rate and rhythm, no murmurs, rubs or gallops, PMI not laterally displaced GI- soft, non-tender, non-distended, bowel sounds present, no hepatosplenomegaly Extremities- No peripheral edema. no  clubbing or cyanosis; DP/PT/radial pulses 2+ bilaterally MS- no significant deformity or atrophy Skin- warm and dry, no rash or lesion Psych- euthymic mood, full affect Neuro- strength and sensation are intact  EKG is ordered. Personal review of EKG from today shows NSR at 71 bpm  Additional studies reviewed include: Previous EP office notes.   Assessment and Plan:  Palpitations Has had documented PVCs and SVT by report.  Suspect very symptomatic PVCs Overall feeling good on propranolol  If she decides to trial, can see if diltiazem has even better suppression, but discussed she should heal completely from her spinal surgery first    Follow up with Dr. Quentin Ore in 6 months. Can call if wants to try diltiazem in the interim. Would start at 180 mg daily and  titrate to effect.   Shirley Friar, PA-C  11/17/21 12:26 PM

## 2021-11-17 ENCOUNTER — Encounter: Payer: Self-pay | Admitting: Student

## 2021-11-17 ENCOUNTER — Ambulatory Visit: Payer: PRIVATE HEALTH INSURANCE | Attending: Student | Admitting: Student

## 2021-11-17 VITALS — BP 108/82 | HR 71 | Ht 64.0 in | Wt 156.8 lb

## 2021-11-17 DIAGNOSIS — R002 Palpitations: Secondary | ICD-10-CM | POA: Diagnosis not present

## 2021-11-17 NOTE — Patient Instructions (Signed)
Medication Instructions:  Your physician recommends that you continue on your current medications as directed. Please refer to the Current Medication list given to you today.  *If you need a refill on your cardiac medications before your next appointment, please call your pharmacy*   Lab Work: None If you have labs (blood work) drawn today and your tests are completely normal, you will receive your results only by: [place] (if you have MyChart) OR A paper copy in the mail If you have any lab test that is abnormal or we need to change your treatment, we will call you to review the results.   Follow-Up: At [facility], you and your health needs are our priority.  As part of our continuing mission to provide you with exceptional heart care, we have created designated Provider Care Teams.  These Care Teams include your primary Cardiologist (physician) and Advanced Practice Providers (APPs -  Physician Assistants and Nurse Practitioners) who all work together to provide you with the care you need, when you need it.   Your next appointment:   6 month(s)  The format for your next appointment:   In Person  Provider:   Lars Mage, MD

## 2021-11-21 ENCOUNTER — Encounter: Payer: PRIVATE HEALTH INSURANCE | Attending: Psychology | Admitting: Psychology

## 2021-11-21 DIAGNOSIS — G43711 Chronic migraine without aura, intractable, with status migrainosus: Secondary | ICD-10-CM | POA: Insufficient documentation

## 2021-11-21 DIAGNOSIS — G9050 Complex regional pain syndrome I, unspecified: Secondary | ICD-10-CM | POA: Insufficient documentation

## 2021-11-21 DIAGNOSIS — F0781 Postconcussional syndrome: Secondary | ICD-10-CM | POA: Insufficient documentation

## 2021-11-21 DIAGNOSIS — F4310 Post-traumatic stress disorder, unspecified: Secondary | ICD-10-CM | POA: Diagnosis present

## 2021-11-22 ENCOUNTER — Encounter: Payer: Self-pay | Admitting: Psychology
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
## 2021-11-30 ENCOUNTER — Ambulatory Visit: Payer: PRIVATE HEALTH INSURANCE | Admitting: Obstetrics & Gynecology

## 2021-12-04 ENCOUNTER — Other Ambulatory Visit: Payer: Self-pay | Admitting: Family Medicine

## 2021-12-12 IMAGING — CT CT ANGIO CHEST
2 of 6 series · 19 of 36 positions shown · IV contrast (omnipaque)
Comparison: Chest radiographs obtained earlier today. Chest CTA
dated 09/04/2006.

CLINICAL DATA: Chest pain and shortness of breath.

EXAM:
CT ANGIOGRAPHY CHEST WITH CONTRAST
TECHNIQUE: Multidetector CT imaging of the chest was performed using the
standard protocol during bolus administration of intravenous
contrast. Multiplanar CT image reconstructions and MIPs were
obtained to evaluate the vascular anatomy.
CONTRAST:  55mL OMNIPAQUE IOHEXOL 350 MG/ML SOLN

[Series 7: pe thins · axial · 0.54mm/px · z∈[+143,+383]mm · 18 of 383 slices shown]
[im 20/383  lung]
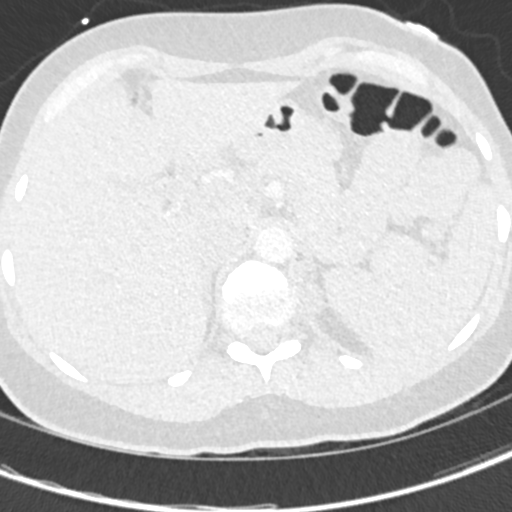
[im 39/383  mediastinal]
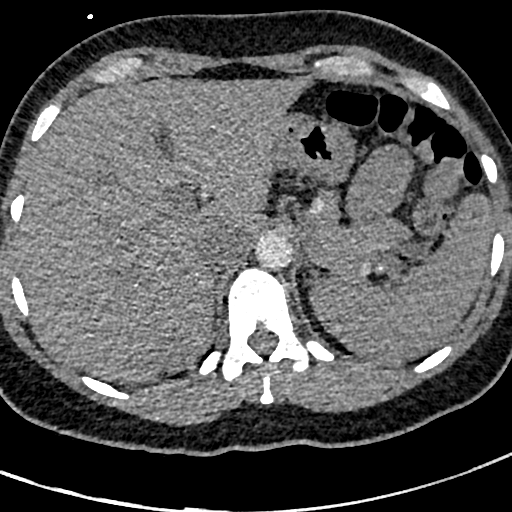
[im 58/383  lung]
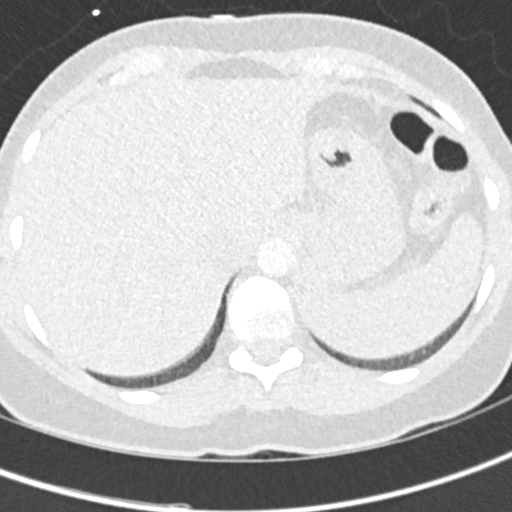
[im 77/383  mediastinal]
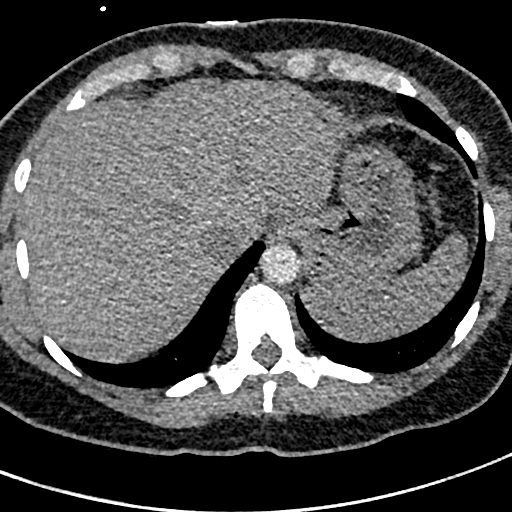
[im 96/383  lung]
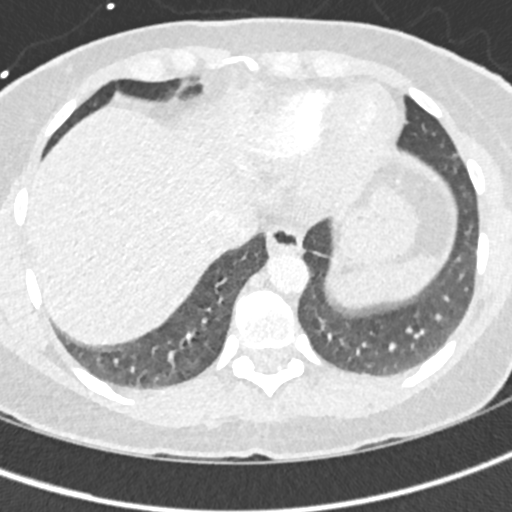
[im 115/383  mediastinal]
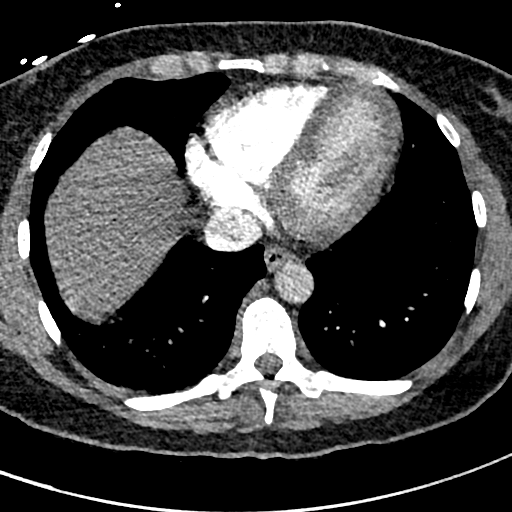
[im 134/383  lung]
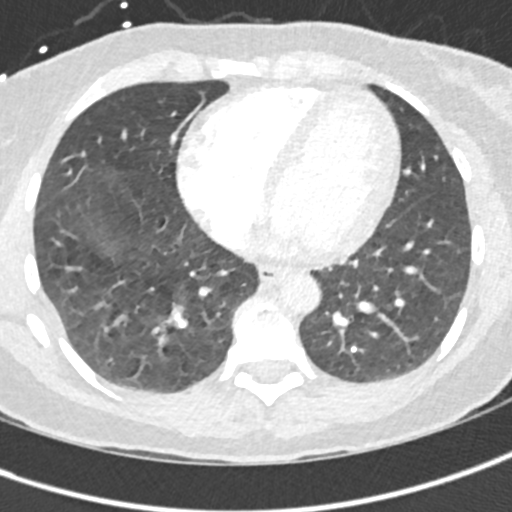
[im 153/383  mediastinal]
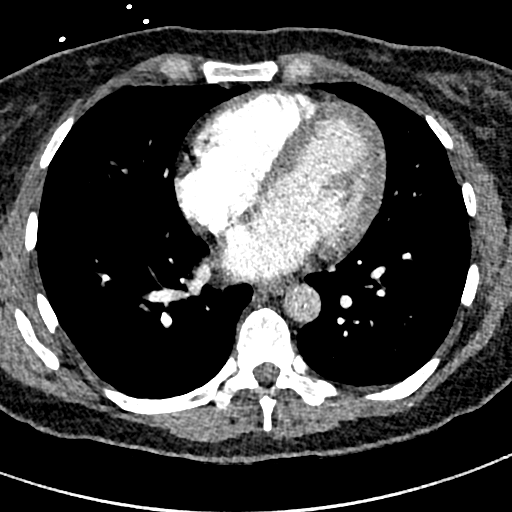
[im 172/383  lung]
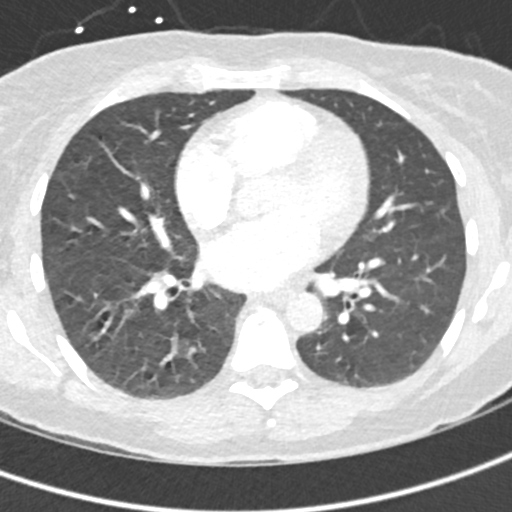
[im 211/383  mediastinal]
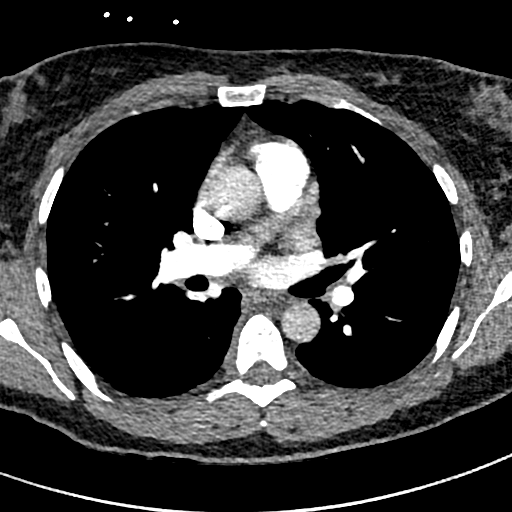
[im 230/383  lung]
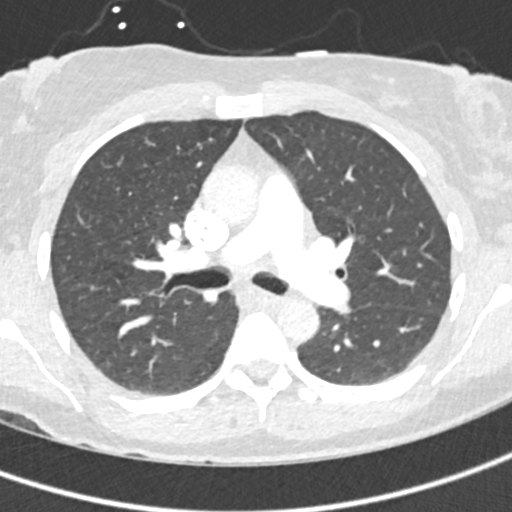
[im 249/383  mediastinal]
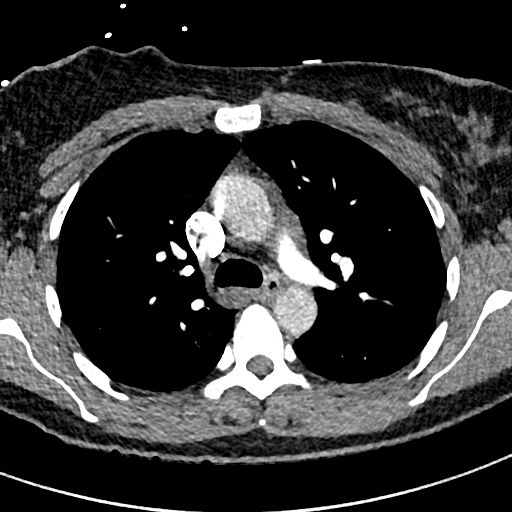
[im 268/383  lung]
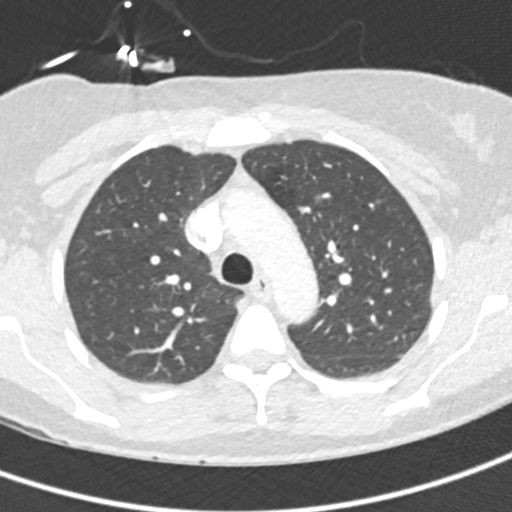
[im 287/383  mediastinal]
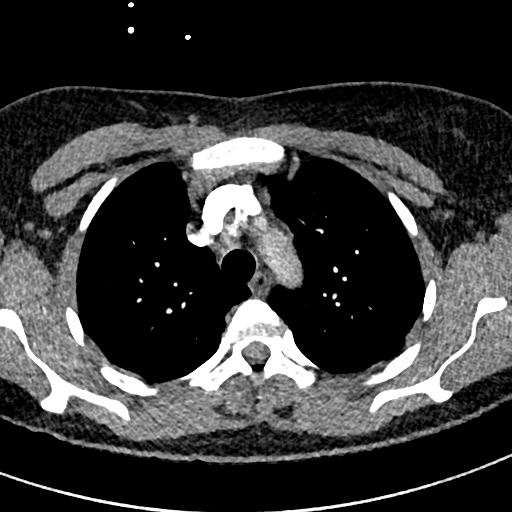
[im 306/383  lung]
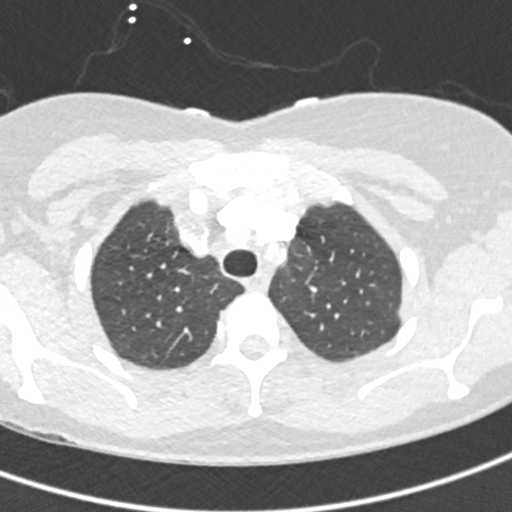
[im 325/383  mediastinal]
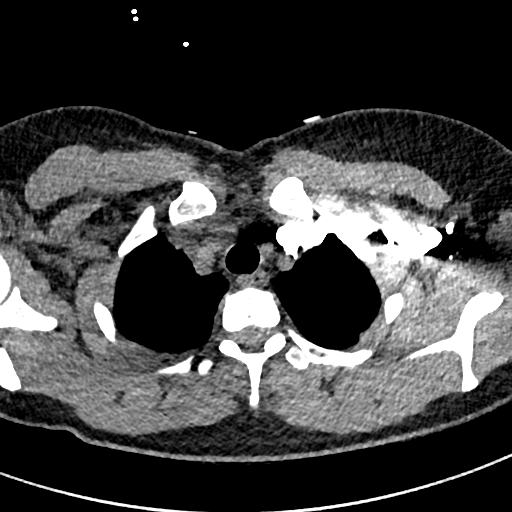
[im 344/383  lung]
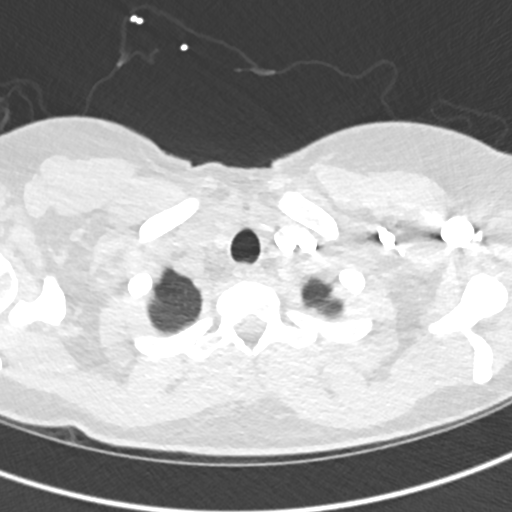
[im 363/383  mediastinal]
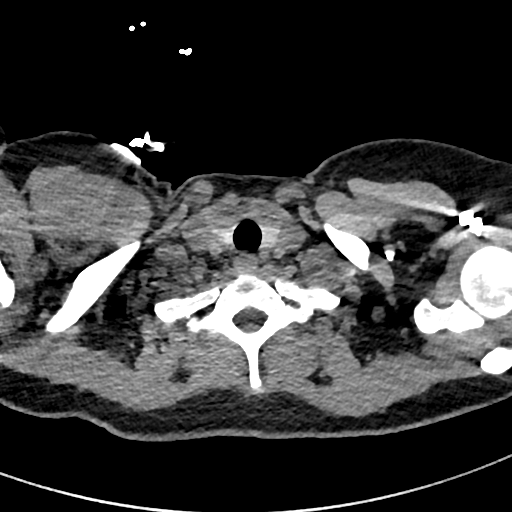

[Series 8: pe 2mm cor · coronal · 0.52mm/px · 1 of 112 slices shown]
[im 56/112  mediastinal]
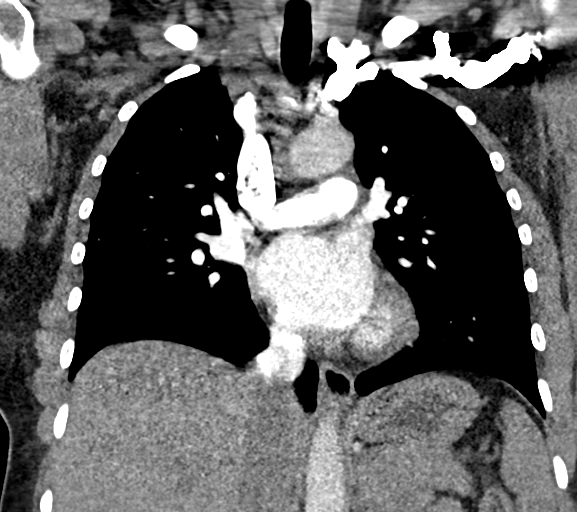

[19 of 36 positions shown; findings below may reference images not displayed]

FINDINGS: Cardiovascular: Satisfactory opacification of the pulmonary arteries
to the segmental level. No evidence of pulmonary embolism. Normal
heart size. No pericardial effusion.

Mediastinum/Nodes: No enlarged mediastinal, hilar, or axillary lymph
nodes. Thyroid gland, trachea, and esophagus demonstrate no
significant findings.

Lungs/Pleura: Small calcified granuloma in the lateral aspect of the
right upper lobe. No airspace consolidation or pleural fluid.

Upper Abdomen: Unremarkable.

Musculoskeletal: Mild thoracic spine degenerative changes.

Review of the MIP images confirms the above findings.
IMPRESSION: No pulmonary emboli or acute abnormality.

## 2021-12-15 ENCOUNTER — Encounter: Payer: PRIVATE HEALTH INSURANCE | Admitting: Psychology

## 2021-12-20 ENCOUNTER — Encounter: Payer: Self-pay | Admitting: Family Medicine

## 2021-12-21 MED ORDER — LORAZEPAM 1 MG PO TABS: ORAL_TABLET | ORAL | 5 refills | Status: DC

## 2021-12-21 MED ORDER — MIRTAZAPINE 15 MG PO TABS: 15.0000 mg | ORAL_TABLET | Freq: Every day | ORAL | 3 refills | Status: DC

## 2021-12-21 NOTE — Telephone Encounter (Signed)
I sent in refills for both, but I wrote where she can take one or two of the Lorazepam if she wants to

## 2021-12-26 ENCOUNTER — Ambulatory Visit: Payer: PRIVATE HEALTH INSURANCE | Admitting: Obstetrics & Gynecology

## 2021-12-29 ENCOUNTER — Other Ambulatory Visit: Payer: Self-pay | Admitting: Obstetrics & Gynecology

## 2021-12-29 DIAGNOSIS — Z1231 Encounter for screening mammogram for malignant neoplasm of breast: Secondary | ICD-10-CM

## 2022-01-06 ENCOUNTER — Ambulatory Visit
Admission: RE | Admit: 2022-01-06 | Discharge: 2022-01-06 | Disposition: A | Discharge status: Home / Self Care | Payer: PRIVATE HEALTH INSURANCE | Source: Ambulatory Visit | Attending: Obstetrics & Gynecology | Admitting: Obstetrics & Gynecology

## 2022-01-06 DIAGNOSIS — Z1231 Encounter for screening mammogram for malignant neoplasm of breast: Secondary | ICD-10-CM

## 2022-01-09 ENCOUNTER — Ambulatory Visit (INDEPENDENT_AMBULATORY_CARE_PROVIDER_SITE_OTHER): Payer: PRIVATE HEALTH INSURANCE | Admitting: Obstetrics & Gynecology

## 2022-01-09 ENCOUNTER — Encounter: Payer: Self-pay | Admitting: Obstetrics & Gynecology

## 2022-01-09 VITALS — BP 138/84 | HR 83 | Resp 20 | Ht 63.98 in | Wt 158.8 lb

## 2022-01-09 DIAGNOSIS — Z90710 Acquired absence of both cervix and uterus: Secondary | ICD-10-CM | POA: Diagnosis not present

## 2022-01-09 DIAGNOSIS — Z01419 Encounter for gynecological examination (general) (routine) without abnormal findings: Secondary | ICD-10-CM

## 2022-01-09 DIAGNOSIS — R350 Frequency of micturition: Secondary | ICD-10-CM | POA: Diagnosis not present

## 2022-01-09 LAB — URINALYSIS, COMPLETE W/RFL CULTURE
Bacteria, UA: NONE SEEN /HPF
Bilirubin Urine: NEGATIVE
Casts: NONE SEEN /LPF
Crystals: NONE SEEN /HPF
Glucose, UA: NEGATIVE
Hgb urine dipstick: NEGATIVE
Hyaline Cast: NONE SEEN /LPF
Leukocyte Esterase: NEGATIVE
Nitrites, Initial: NEGATIVE
Protein, ur: NEGATIVE
RBC / HPF: NONE SEEN /HPF (ref 0–2)
Specific Gravity, Urine: 1.01 (ref 1.001–1.035)
WBC, UA: NONE SEEN /HPF (ref 0–5)
Yeast: NONE SEEN /HPF
pH: 5 (ref 5.0–8.0)

## 2022-01-09 LAB — NO CULTURE INDICATED

## 2022-01-09 NOTE — Progress Notes (Signed)
[name] Jul 18, 1970 [id number]   History:    51 y.o. [id number] Married   RP:  Established patient presenting for annual gyn exam    HPI: S/P XI Robotic TLH/Bilateral Salpingectomy on 04/14/2020.  No abdominopelvic pain, some intermittent suprapubic discomfort.  Difficulty completely emptying her bladder.  Has Interstitial Cystitis.  No pain with IC. Pap 07/03/19 ASCUS/HPV HR Neg.  Patho on Cervix (total Hyst) 04/14/2020 was Benign.  Breasts lumpy bilaterally. Mammo 01/06/22 pending. Colono 06/2019, repeat at 3 years.  BMI 27.28.  Health labs with Fam MD.  2 GMs with Ostoporosis.  BD 09/07/2020 Normal.  Followed by Cardio (Electrical heart specialist).  Cervical fusion surgery and shoulder surgery x last year.   Past medical history,surgical history, family history and social history were all reviewed and documented in the EPIC chart.  Gynecologic History Patient's last menstrual period was 03/21/2020.  Obstetric History OB History  Gravida Para Term Preterm AB Living  '4 3     1 3  '$ SAB IAB Ectopic Multiple Live Births               # Outcome Date GA Lbr Len/2nd Weight Sex Delivery Anes PTL Lv  4 AB           3 Para           2 Para           1 Para              ROS: A ROS was performed and pertinent positives and negatives are included in the history.  GENERAL: No fevers or chills. HEENT: No change in vision, no earache, sore throat or sinus congestion. NECK: No pain or stiffness. CARDIOVASCULAR: No chest pain or pressure. No palpitations. PULMONARY: No shortness of breath, cough or wheeze. GASTROINTESTINAL: No abdominal pain, nausea, vomiting or diarrhea, melena or bright red blood per rectum. GENITOURINARY: No urinary frequency, urgency, hesitancy or dysuria. MUSCULOSKELETAL: No joint or muscle pain, no back pain, no recent trauma. DERMATOLOGIC: No rash, no itching, no lesions. ENDOCRINE: No polyuria, polydipsia, no heat or cold intolerance. No recent change in weight.  HEMATOLOGICAL: No anemia or easy bruising or bleeding. NEUROLOGIC: No headache, seizures, numbness, tingling or weakness. PSYCHIATRIC: No depression, no loss of interest in normal activity or change in sleep pattern.     Exam:   BP 138/84 (BP Location: Right Arm, Patient Position: Sitting)   Pulse 83   Resp 20   Ht 5' 3.98" (1.625 m)   Wt 158 lb 12.8 oz (72 kg)   LMP 03/21/2020   SpO2 99%   BMI 27.28 kg/m   Body mass index is 27.28 kg/m.  General appearance : Well developed well nourished female. No acute distress HEENT: Eyes: no retinal hemorrhage or exudates,  Neck supple, trachea midline, no carotid bruits, no thyroidmegaly Lungs: Clear to auscultation, no rhonchi or wheezes, or rib retractions  Heart: Regular rate and rhythm, no murmurs or gallops Breast:Examined in sitting and supine position were symmetrical in appearance, no palpable masses or tenderness,  no skin retraction, no nipple inversion, no nipple discharge, no skin discoloration, no axillary or supraclavicular lymphadenopathy Abdomen: no palpable masses or tenderness, no rebound or guarding Extremities: no edema or skin discoloration or tenderness  Pelvic: Vulva: Normal             Vagina: No gross lesions or discharge  Cervix/Uterus absent  Adnexa  Without masses or tenderness  Anus: Normal  U/A:  Completely Negative   Assessment/Plan:  51 y.o. female for annual exam   1. Well female exam with routine gynecological exam S/P XI Robotic TLH/Bilateral Salpingectomy on 04/14/2020.  No abdominopelvic pain, some intermittent suprapubic discomfort.  Difficulty completely emptying her bladder.  Has Interstitial Cystitis.  No pain with IC. Pap 07/03/19 ASCUS/HPV HR Neg.  Patho on Cervix (total Hyst) 04/14/2020 was Benign.  Breasts lumpy bilaterally. Mammo 01/06/22 pending. Colono 06/2019, repeat at 3 years.  BMI 27.28.  Health labs with Fam MD.  2 GMs with Ostoporosis.  BD 09/07/2020 Normal.  Followed by Cardio (Electrical heart  specialist).  Cervical fusion surgery and shoulder surgery x last year.  2. S/P total hysterectomy  3. Urinary frequency U/A Completely Negative. - Urinalysis,Complete w/RFL Culture  Other orders - meloxicam (MOBIC) 15 MG tablet; Take 1 tablet by mouth daily. - tretinoin (RETIN-A) 0.025 % cream   Princess Bruins MD, 2:42 PM 01/09/2022

## 2022-02-13 ENCOUNTER — Encounter: Payer: PRIVATE HEALTH INSURANCE | Attending: Psychology | Admitting: Psychology

## 2022-02-13 DIAGNOSIS — F4310 Post-traumatic stress disorder, unspecified: Secondary | ICD-10-CM | POA: Diagnosis present

## 2022-02-13 DIAGNOSIS — G43711 Chronic migraine without aura, intractable, with status migrainosus: Secondary | ICD-10-CM | POA: Diagnosis present

## 2022-02-13 DIAGNOSIS — G9050 Complex regional pain syndrome I, unspecified: Secondary | ICD-10-CM | POA: Diagnosis present

## 2022-02-13 DIAGNOSIS — F0781 Postconcussional syndrome: Secondary | ICD-10-CM | POA: Insufficient documentation

## 2022-02-19 ENCOUNTER — Encounter: Payer: Self-pay | Admitting: Psychology
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
## 2022-03-23 ENCOUNTER — Ambulatory Visit: Payer: PRIVATE HEALTH INSURANCE | Admitting: Psychology

## 2022-04-05 ENCOUNTER — Encounter: Payer: PRIVATE HEALTH INSURANCE | Admitting: Psychology

## 2022-04-12 ENCOUNTER — Encounter: Payer: PRIVATE HEALTH INSURANCE | Admitting: Psychology

## 2022-05-16 ENCOUNTER — Ambulatory Visit: Payer: PRIVATE HEALTH INSURANCE | Admitting: Cardiology

## 2022-05-22 ENCOUNTER — Other Ambulatory Visit: Payer: Self-pay | Admitting: Family Medicine

## 2022-06-23 ENCOUNTER — Encounter: Payer: Self-pay | Admitting: Gastroenterology

## 2022-06-29 ENCOUNTER — Ambulatory Visit (AMBULATORY_SURGERY_CENTER): Payer: PRIVATE HEALTH INSURANCE | Admitting: *Deleted

## 2022-06-29 VITALS — Ht 64.0 in | Wt 160.0 lb

## 2022-06-29 DIAGNOSIS — Z86010 Personal history of colonic polyps: Secondary | ICD-10-CM

## 2022-06-29 MED ORDER — NA SULFATE-K SULFATE-MG SULF 17.5-3.13-1.6 GM/177ML PO SOLN: 1.0000 | Freq: Once | ORAL | 0 refills | Status: AC

## 2022-06-29 NOTE — Progress Notes (Signed)
No egg or soy allergy known to patient  No issues known to pt with past sedation with any surgeries or procedures Patient denies ever being told they had issues or difficulty with intubation  No FH of Malignant Hyperthermia Pt is not on diet pills Pt is not on  home 02  Pt is not on blood thinners  Pt has issues with constipation,2 day prep given. No A fib or A flutter Have any cardiac testing pending--no Pt instructed to use [employer] or GoodRx for a price reduction on prep    Has cardiac arrhythmia Independently mobility When had last surgery went into svt  Patient's chart reviewed by [name] CNRA prior to previsit and patient appropriate for the [facility].  Previsit completed and red dot placed by patient's name on their procedure day (on provider's schedule).

## 2022-07-03 ENCOUNTER — Encounter: Payer: PRIVATE HEALTH INSURANCE | Admitting: Gastroenterology

## 2022-07-07 ENCOUNTER — Other Ambulatory Visit: Payer: Self-pay | Admitting: Family Medicine

## 2022-07-10 ENCOUNTER — Encounter: Payer: PRIVATE HEALTH INSURANCE | Admitting: Gastroenterology

## 2022-07-10 NOTE — Telephone Encounter (Signed)
Pt LOV was on 11/08/21 Last refill was done on 12/21/21 Please advise

## 2022-07-11 ENCOUNTER — Encounter: Payer: Self-pay | Admitting: Gastroenterology

## 2022-07-18 ENCOUNTER — Encounter: Payer: Self-pay | Admitting: Gastroenterology

## 2022-07-18 ENCOUNTER — Ambulatory Visit (AMBULATORY_SURGERY_CENTER): Payer: PRIVATE HEALTH INSURANCE | Admitting: Gastroenterology

## 2022-07-18 VITALS — BP 122/70 | HR 76 | Temp 98.9°F | Resp 17 | Ht 64.0 in | Wt 160.0 lb

## 2022-07-18 DIAGNOSIS — Z86010 Personal history of colonic polyps: Secondary | ICD-10-CM

## 2022-07-18 DIAGNOSIS — D120 Benign neoplasm of cecum: Secondary | ICD-10-CM | POA: Diagnosis not present

## 2022-07-18 DIAGNOSIS — Z09 Encounter for follow-up examination after completed treatment for conditions other than malignant neoplasm: Secondary | ICD-10-CM

## 2022-07-18 DIAGNOSIS — D123 Benign neoplasm of transverse colon: Secondary | ICD-10-CM

## 2022-07-18 DIAGNOSIS — K635 Polyp of colon: Secondary | ICD-10-CM | POA: Diagnosis not present

## 2022-07-18 MED ORDER — SODIUM CHLORIDE 0.9 % IV SOLN: 500.0000 mL | Freq: Once | INTRAVENOUS | Status: DC

## 2022-07-18 NOTE — Progress Notes (Signed)
Pt's states no medical or surgical changes since previsit or office visit. 

## 2022-07-18 NOTE — Patient Instructions (Signed)
Handout on polyps and diverticulosis given to patient.  Await pathology results. Resume previous diet and continue present medications. Repeat colonoscopy for surveillance will be determined based off of pathology results.   YOU HAD AN ENDOSCOPIC PROCEDURE TODAY AT THE [facility]:   Refer to the procedure report that was given to you for any specific questions about what was found during the examination.  If the procedure report does not answer your questions, please call your gastroenterologist to clarify.  If you requested that your care partner not be given the details of your procedure findings, then the procedure report has been included in a sealed envelope for you to review at your convenience later.  YOU SHOULD EXPECT: Some feelings of bloating in the abdomen. Passage of more gas than usual.  Walking can help get rid of the air that was put into your GI tract during the procedure and reduce the bloating. If you had a lower endoscopy (such as a colonoscopy or flexible sigmoidoscopy) you may notice spotting of blood in your stool or on the toilet paper. If you underwent a bowel prep for your procedure, you may not have a normal bowel movement for a few days.  Please Note:  You might notice some irritation and congestion in your nose or some drainage.  This is from the oxygen used during your procedure.  There is no need for concern and it should clear up in a day or so.  SYMPTOMS TO REPORT IMMEDIATELY:  Following lower endoscopy (colonoscopy or flexible sigmoidoscopy):  Excessive amounts of blood in the stool  Significant tenderness or worsening of abdominal pains  Swelling of the abdomen that is new, acute  Fever of 100F or higher  For urgent or emergent issues, a gastroenterologist can be reached at any hour by calling [phone]. Do not use MyChart messaging for urgent concerns.    DIET:  We do recommend a small meal at first, but then you may proceed to your  regular diet.  Drink plenty of fluids but you should avoid alcoholic beverages for 24 hours.  ACTIVITY:  You should plan to take it easy for the rest of today and you should NOT DRIVE or use heavy machinery until tomorrow (because of the sedation medicines used during the test).    FOLLOW UP: Our staff will call the number listed on your records the next business day following your procedure.  We will call around 7:15- 8:00 am to check on you and address any questions or concerns that you may have regarding the information given to you following your procedure. If we do not reach you, we will leave a message.     If any biopsies were taken you will be contacted by phone or by letter within the next 1-3 weeks.  Please call us at [phone] if you have not heard about the biopsies in 3 weeks.    SIGNATURES/CONFIDENTIALITY: You and/or your care partner have signed paperwork which will be entered into your electronic medical record.  These signatures attest to the fact that that the information above on your After Visit Summary has been reviewed and is understood.  Full responsibility of the confidentiality of this discharge information lies with you and/or your care-partner. 

## 2022-07-18 NOTE — Progress Notes (Signed)
[facility] History and Physical   Primary Care Physician:  Nelwyn Salisbury, MD   Reason for Procedure:   History of colon polyps  Plan:    06/2019     HPI: [name] is a 52 y.o. female  here for colonoscopy surveillance - 06/2019 last exam with 3 polyps, TA and SSP. Has had some chronic constipation. Otherwise feels well without any cardiopulmonary symptoms.   I have discussed risks / benefits of anesthesia and endoscopic procedure with Abelino Derrick and they wish to proceed with the exams as outlined today.    Past Medical History:  Diagnosis Date   ADHD (attention deficit hyperactivity disorder)    pt does not take meds (diagnosed years ago)   Anemia    iron deficiency    Blood transfusion without reported diagnosis    Cardiac arrhythmia    Chronic headaches    Chronic lumbar pain since 2013   sees Dr. Barbette Merino    Clotting disorder [facility])    PE prior to pregnancy.   Complication of anesthesia    Bp drops and alot of shaking after anesthesia   COVID-19 03/01/2020   Family history of adverse reaction to anesthesia    Pt thinks her mother had some cardiac arrhythmias with anesthesia   Interstitial cystitis    Palpitations 02/19/2018   PE (pulmonary embolism)    due to surgery   PONV (postoperative nausea and vomiting)    Sepsis (HCC)    due to surgery   Staph infection    SVT (supraventricular tachycardia) 05/16/2021    Past Surgical History:  Procedure Laterality Date   ANTERIOR CERVICAL DECOMP/DISCECTOMY FUSION N/A 10/17/2021   Procedure: ANTERIOR CERVICAL DISCECTOMY AND FUSION FOUR TO SIX;  Surgeon: Venita Lick, MD;  Location: [facility] OR;  Service: Orthopedics;  Laterality: N/A;   BREAST REDUCTION SURGERY     COLONOSCOPY  06/25/2019   per Dr. Adela Lank, adeomatous polyps, repeat in 3 yrs    DILATION AND CURETTAGE OF UTERUS     EXPLORATORY LAPAROTOMY     with left ovary and mass removed   RADIOFREQUENCY ABLATION NERVES     lumbar  spine, per Dr. Eduard Clos    ROBOTIC ASSISTED LAPAROSCOPIC HYSTERECTOMY AND SALPINGECTOMY Bilateral 04/14/2020   Procedure: XI ROBOTIC ASSISTED TOTAL LAPAROSCOPIC HYSTERECTOMY AND SALPINGECTOMY;  Surgeon: Genia Del, MD;  Location: [facility] OR;  Service: Gynecology;  Laterality: Bilateral;   SHOULDER ARTHROSCOPY WITH SUBACROMIAL DECOMPRESSION Right 09/14/2020   Procedure: SHOULDER MANIPULATION UNDER ANESTHESIA, ARTHROSCOPIC LYSIS OF ADHESIONS AND SUBACROMIAL DECOMPRESSION;  Surgeon: Yolonda Kida, MD;  Location: [facility] OR;  Service: Orthopedics;  Laterality: Right;    TUBAL LIGATION     UPPER GASTROINTESTINAL ENDOSCOPY      Prior to Admission medications   Medication Sig Start Date End Date Taking? Authorizing Provider  LORazepam (ATIVAN) 1 MG tablet TAKE 1 TO 2 TABLETS BY MOUTH AT BEDTIME AS NEEDED FOR SLEEP 07/11/22  Yes Nelwyn Salisbury, MD  mirtazapine (REMERON) 15 MG tablet Take 1 tablet (15 mg total) by mouth at bedtime. 12/21/21  Yes Nelwyn Salisbury, MD  Polyethyl Glycol-Propyl Glycol (SYSTANE FREE OP) Apply to eye daily.   Yes [provider]  propranolol (INDERAL) 20 MG tablet Take 1 tablet (20 mg total) by mouth 2 (two) times daily. 09/22/21  Yes Lanier Prude, MD  tretinoin (RETIN-A) 0.025 % cream    Yes [provider]  cyanocobalamin (VITAMIN B12) 1000 MCG/ML injection INJECT 1 ML  INTO SKIN WEEKLY 05/23/22   Nelwyn Salisbury, MD  gabapentin (NEURONTIN) 300 MG capsule Take 300 mg by mouth at bedtime. Patient not taking: Reported on 06/29/2022 10/26/21   [provider]  ibuprofen (ADVIL) 800 MG tablet Take 800 mg by mouth as needed for moderate pain.    [provider]  ondansetron (ZOFRAN) 4 MG tablet Take 1 tablet (4 mg total) by mouth every 8 (eight) hours as needed for nausea or vomiting. Patient not taking: Reported on 06/29/2022 10/17/21   Venita Lick, MD  Vitamin D, Ergocalciferol, (DRISDOL) 1.25 MG (50000 UNIT) CAPS capsule Take 1 capsule  (50,000 Units total) by mouth every 7 (seven) days. 11/11/21   Nelwyn Salisbury, MD    Current Outpatient Medications  Medication Sig Dispense Refill   LORazepam (ATIVAN) 1 MG tablet TAKE 1 TO 2 TABLETS BY MOUTH AT BEDTIME AS NEEDED FOR SLEEP 60 tablet 5   mirtazapine (REMERON) 15 MG tablet Take 1 tablet (15 mg total) by mouth at bedtime. 90 tablet 3   Polyethyl Glycol-Propyl Glycol (SYSTANE FREE OP) Apply to eye daily.     propranolol (INDERAL) 20 MG tablet Take 1 tablet (20 mg total) by mouth 2 (two) times daily. 180 tablet 3   tretinoin (RETIN-A) 0.025 % cream      cyanocobalamin (VITAMIN B12) 1000 MCG/ML injection INJECT 1 ML INTO SKIN WEEKLY 9 mL 1   gabapentin (NEURONTIN) 300 MG capsule Take 300 mg by mouth at bedtime. (Patient not taking: Reported on 06/29/2022)     ibuprofen (ADVIL) 800 MG tablet Take 800 mg by mouth as needed for moderate pain.     ondansetron (ZOFRAN) 4 MG tablet Take 1 tablet (4 mg total) by mouth every 8 (eight) hours as needed for nausea or vomiting. (Patient not taking: Reported on 06/29/2022) 20 tablet 0   Vitamin D, Ergocalciferol, (DRISDOL) 1.25 MG (50000 UNIT) CAPS capsule Take 1 capsule (50,000 Units total) by mouth every 7 (seven) days. 12 capsule 3   Current Facility-Administered Medications  Medication Dose Route Frequency Provider Last Rate Last Admin   0.9 %  sodium chloride infusion  500 mL Intravenous Once [place] Cruzan, Willaim Rayas, MD        Allergies as of 07/18/2022   (No Known Allergies)    Family History  Problem Relation Age of Onset   Colon polyps Mother    Irritable bowel syndrome Mother    Heart defect Mother        tachycardia   Liver disease Father    Hypertension Father    COPD Father    Diabetes Sister    Hypertension Sister    Heart Problems Sister    Irritable bowel syndrome Paternal Aunt    Cancer Paternal Aunt        melanoma   Colon polyps Maternal Grandmother    Hypertension Maternal Grandmother    Diabetes Maternal  Grandfather    Heart disease Maternal Grandfather    Hypertension Maternal Grandfather    Colon polyps Paternal Grandmother    Hypertension Paternal Grandmother    Heart defect Paternal Grandmother    Heart attack Paternal Grandmother    Diabetes Paternal Grandfather    Heart disease Paternal Grandfather    Hypertension Paternal Grandfather    Heart attack Paternal Grandfather    Colon cancer Neg Hx    Esophageal cancer Neg Hx    Rectal cancer Neg Hx    Stomach cancer Neg Hx    Crohn's disease Neg  Hx    Ulcerative colitis Neg Hx     Social History   Socioeconomic History   Marital status: Married    Spouse name: Not on file   Number of children: 3   Years of education: Not on file   Highest education level: Not on file  Occupational History   Occupation: Print production planner  Tobacco Use   Smoking status: Never   Smokeless tobacco: Never  Vaping Use   Vaping Use: Never used  Substance and Sexual Activity   Alcohol use: Yes    Comment: glass of wine occasionally (maybe once a month)   Drug use: No   Sexual activity: Yes    Partners: Male    Birth control/protection: Surgical  Other Topics Concern   Not on file  Social History Narrative   Lives at home with husband and 2 children   Right handed   Caffeine: 1-2 cups of coffee per day   Social Determinants of Health   Financial Resource Strain: Not on file  Food Insecurity: Not on file  Transportation Needs: Not on file  Physical Activity: Not on file  Stress: Not on file  Social Connections: Not on file  Intimate Partner Violence: Not on file    Review of Systems: All other review of systems negative except as mentioned in the HPI.  Physical Exam: Vital signs BP 132/85   Pulse 72   Temp 98.9 F (37.2 C)   Resp 13   Ht 5\' 4"  (1.626 m)   Wt 160 lb (72.6 kg)   LMP 03/21/2020   SpO2 100%   BMI 27.46 kg/m   General:   Alert,  Well-developed, pleasant and cooperative in NAD Lungs:  Clear throughout to  auscultation.   Heart:  Regular rate and rhythm Abdomen:  Soft, nontender and nondistended.   Neuro/Psych:  Alert and cooperative. Normal mood and affect. A and O x 3  Harlin Rain, MD [facility]

## 2022-07-18 NOTE — Op Note (Signed)
[facility] Patient Name: [name] Procedure Date: 07/18/2022 3:56 PM MRN: [id number] Endoscopist: Viviann Spare P. Adela Lank , MD, [id number] Age: 52 Referring MD:  Date of Birth: 01-03-1971 Gender: Female Account #: [id number] Procedure:                Colonoscopy Indications:              High risk colon cancer surveillance: Personal                            history of colonic polyps - last exam 06/2019 - 3                            polyps removed - adenoma / sessile serrated Medicines:                Monitored Anesthesia Care Procedure:                Pre-Anesthesia Assessment:                           - Prior to the procedure, a History and Physical                            was performed, and patient medications and                            allergies were reviewed. The patient's tolerance of                            previous anesthesia was also reviewed. The risks                            and benefits of the procedure and the sedation                            options and risks were discussed with the patient.                            All questions were answered, and informed consent                            was obtained. Prior Anticoagulants: The patient has                            taken no anticoagulant or antiplatelet agents. ASA                            Grade Assessment: II - A patient with mild systemic                            disease. After reviewing the risks and benefits,                            the patient was deemed in satisfactory condition to  undergo the procedure.                           After obtaining informed consent, the colonoscope                            was passed under direct vision. Throughout the                            procedure, the patient's blood pressure, pulse, and                            oxygen saturations were monitored continuously. The                            Olympus  PCF-H190DL (#[id number]) Colonoscope was                            introduced through the anus and advanced to the the                            cecum, identified by appendiceal orifice and                            ileocecal valve. The colonoscopy was performed                            without difficulty. The patient tolerated the                            procedure well. The quality of the bowel                            preparation was good. The ileocecal valve,                            appendiceal orifice, and rectum were photographed. Scope In: 4:21:42 PM Scope Out: 4:43:29 PM Scope Withdrawal Time: 0 hours 15 minutes 0 seconds  Total Procedure Duration: 0 hours 21 minutes 47 seconds  Findings:                 The perianal and digital rectal examinations were                            normal.                           The colon was quite tortuous. Pediatric colonoscope                            used to complete the exam.                           A few small angiodysplastic lesions were found in  the proximal transverse colon, in the ascending                            colon and in the cecum.                           A 3 mm polyp was found in the cecum. The polyp was                            sessile. The polyp was removed with a cold snare.                            Resection and retrieval were complete.                           A 3 mm polyp was found in the transverse colon. The                            polyp was flat. The polyp was removed with a cold                            snare. Resection and retrieval were complete.                           A few small-mouthed diverticula were found in the                            sigmoid colon.                           The exam was otherwise without abnormality. Complications:            No immediate complications. Estimated blood loss:                            Minimal. Estimated Blood Loss:      Estimated blood loss was minimal. Impression:               - Tortuous colon.                           - A few colonic angiodysplastic lesions.                           - One 3 mm polyp in the cecum, removed with a cold                            snare. Resected and retrieved.                           - One 3 mm polyp in the transverse colon, removed                            with a cold snare. Resected and retrieved.                           -  Diverticulosis in the sigmoid colon.                           - The examination was otherwise normal.                           - The GI Genius (intelligent endoscopy module),                            computer-aided polyp detection system powered by AI                            was utilized to detect colorectal polyps through                            enhanced visualization during colonoscopy. Recommendation:           - Patient has a contact number available for                            emergencies. The signs and symptoms of potential                            delayed complications were discussed with the                            patient. Return to normal activities tomorrow.                            Written discharge instructions were provided to the                            patient.                           - Resume previous diet.                           - Continue present medications.                           - Await pathology results. Viviann Spare P. Yianna Tersigni, MD 07/18/2022 4:49:49 PM This report has been signed electronically.

## 2022-07-18 NOTE — Progress Notes (Signed)
Called to room to assist during endoscopic procedure.  Patient ID and intended procedure confirmed with present staff. Received instructions for my participation in the procedure from the performing physician.  

## 2022-07-19 ENCOUNTER — Telehealth: Payer: Self-pay | Admitting: *Deleted

## 2022-07-19 NOTE — Telephone Encounter (Signed)
  Follow up Call-     07/18/2022    3:18 PM 02/17/2020    7:09 AM  Call back number  Post procedure Call Back phone  # [phone] #[phone] cell  Permission to leave phone message Yes Yes     Patient questions:  Do you have a fever, pain , or abdominal swelling? No. Pain Score  0 *  Have you tolerated food without any problems? Yes.    Have you been able to return to your normal activities? Yes.    Do you have any questions about your discharge instructions: Diet   No. Medications  No. Follow up visit  No.  Do you have questions or concerns about your Care? No.  Actions: * If pain score is 4 or above: No action needed, pain <4.

## 2022-07-24 ENCOUNTER — Other Ambulatory Visit: Payer: Self-pay | Admitting: Family Medicine

## 2022-07-25 NOTE — Telephone Encounter (Signed)
Prescription Request  07/25/2022  LOV: 11/08/2021  What is the name of the medication or equipment?  LORazepam LORazepam (ATIVAN) 1 MG tablet  Have you contacted your pharmacy to request a refill? Yes   Pt states the pharmacy told her they do not have a refill for her, but Pt states MD sent enough refills to cover her until November. Pt states she is almost out.   Which pharmacy would you like this sent to?   [facility] [phone] - [place], [place] - [address] Phone: [phone]  Fax: [phone]       Patient notified that their request is being sent to the clinical staff for review and that they should receive a response within 2 business days.   Please advise at Mobile [phone] (mobile)

## 2022-07-25 NOTE — Addendum Note (Signed)
Addended by: Christy Sartorius on: 07/25/2022 10:53 AM   Modules accepted: Orders

## 2022-07-27 NOTE — Telephone Encounter (Signed)
Pt called CMA back.   Pt was read CMA's previous message.  Pt expressed that she was extremely grateful and would follow up with Pharmacy.  Thanks, Enrique Sack !!

## 2022-07-27 NOTE — Telephone Encounter (Signed)
Pt called to FU on this refill request.   Pt states she only has 2 days left of meds.  Pt is asking what else can we do to help with this request.   Pt is asking that we please refill as soon as possible.

## 2022-07-27 NOTE — Telephone Encounter (Signed)
Pt called back and stated that the pharmacy never received the Rx. Called pharmacy and gave a verbal order. No further actions needed.

## 2022-07-28 ENCOUNTER — Encounter: Payer: Self-pay | Admitting: Family Medicine

## 2022-08-01 ENCOUNTER — Other Ambulatory Visit: Payer: Self-pay

## 2022-08-01 NOTE — Telephone Encounter (Signed)
Left pt a message advised that PCP is out of the office but message will be routed to PCP for advise

## 2022-08-04 ENCOUNTER — Other Ambulatory Visit: Payer: Self-pay

## 2022-08-04 NOTE — Telephone Encounter (Signed)
Spoke with pt advised that her message will be sent to Dr Clent Ridges so he can review this. Pt stated that her pharmacy refilled 30 tablets but she will need a refill with the correct directions sent to her pharmacy. Please advise

## 2022-08-08 ENCOUNTER — Other Ambulatory Visit: Payer: Self-pay

## 2022-08-08 NOTE — Telephone Encounter (Signed)
Spoke with pt pharmacy state that they only have a prescription for Lorazepam with directions of 1 tablet daily. Rx sent on 07/11/22 failed. Pt pharmacy [facility] [place] states that they need a new Rx with directions of 1-2 tablets daily sent in. Please advise

## 2022-08-08 NOTE — Telephone Encounter (Signed)
This is exactly what I prescribed on 07-11-22. They are supposed to give her 60 per month. Please call the pharmacy to straighten this out

## 2022-08-09 MED ORDER — LORAZEPAM 1 MG PO TABS: ORAL_TABLET | ORAL | 5 refills | Status: DC

## 2022-08-09 NOTE — Telephone Encounter (Signed)
I re-sent the RX

## 2022-08-09 NOTE — Addendum Note (Signed)
Addended by: Gershon Crane A on: 08/09/2022 12:31 PM   Modules accepted: Orders

## 2022-08-11 ENCOUNTER — Ambulatory Visit: Payer: PRIVATE HEALTH INSURANCE | Admitting: Cardiology

## 2022-08-23 ENCOUNTER — Encounter: Payer: Self-pay | Admitting: Family Medicine

## 2022-08-23 ENCOUNTER — Ambulatory Visit (INDEPENDENT_AMBULATORY_CARE_PROVIDER_SITE_OTHER): Payer: PRIVATE HEALTH INSURANCE | Admitting: Family Medicine

## 2022-08-23 VITALS — BP 110/78 | HR 78 | Temp 98.1°F | Wt 169.0 lb

## 2022-08-23 DIAGNOSIS — W57XXXA Bitten or stung by nonvenomous insect and other nonvenomous arthropods, initial encounter: Secondary | ICD-10-CM

## 2022-08-23 DIAGNOSIS — S90861A Insect bite (nonvenomous), right foot, initial encounter: Secondary | ICD-10-CM

## 2022-08-23 MED ORDER — DOXYCYCLINE HYCLATE 100 MG PO CAPS: 100.0000 mg | ORAL_CAPSULE | Freq: Two times a day (BID) | ORAL | 0 refills | Status: AC

## 2022-08-23 NOTE — Progress Notes (Signed)
   Subjective:    Patient ID: [name], female    DOB: 05-28-1970, 52 y.o.   MRN: [id number]  HPI Here for a tick bite between the 3rd and 4th toes on the right foot. She pulled a tick out about 10 days ago. Now the site has a small blister and she has mild pains in the forefoot just proximal to the site. No fevers or rashes. She has been applying Mupiricin cream daily.    Review of Systems  Constitutional: Negative.   Respiratory: Negative.    Cardiovascular: Negative.   Skin:  Positive for wound.       Objective:   Physical Exam Constitutional:      Appearance: Normal appearance. She is not ill-appearing.  Cardiovascular:     Rate and Rhythm: Normal rate and regular rhythm.     Pulses: Normal pulses.     Heart sounds: Normal heart sounds.  Pulmonary:     Effort: Pulmonary effort is normal.  Musculoskeletal:     Comments: Mildly tender over the distal forefoot.   Skin:    Comments: There is a 3 mm vesicle that is draining clear fluid in the webbing between the right 3rd and 4th toes. No surrounding erythema.   Neurological:     Mental Status: She is alert.           Assessment & Plan:  Tick bite. We will cover this with 10 days of Doxycycline. Recheck as needed. Gershon Crane, MD

## 2022-09-18 ENCOUNTER — Telehealth: Payer: Self-pay | Admitting: Hematology and Oncology

## 2022-09-19 ENCOUNTER — Inpatient Hospital Stay: Payer: PRIVATE HEALTH INSURANCE | Admitting: Hematology and Oncology

## 2022-09-21 ENCOUNTER — Ambulatory Visit: Payer: PRIVATE HEALTH INSURANCE | Admitting: Cardiology

## 2022-10-04 ENCOUNTER — Ambulatory Visit: Payer: PRIVATE HEALTH INSURANCE | Admitting: Gastroenterology

## 2022-11-08 ENCOUNTER — Ambulatory Visit: Payer: PRIVATE HEALTH INSURANCE | Admitting: Student

## 2022-11-09 ENCOUNTER — Inpatient Hospital Stay: Payer: PRIVATE HEALTH INSURANCE | Admitting: Hematology and Oncology

## 2022-12-13 ENCOUNTER — Ambulatory Visit (INDEPENDENT_AMBULATORY_CARE_PROVIDER_SITE_OTHER): Payer: PRIVATE HEALTH INSURANCE | Admitting: Family Medicine

## 2022-12-13 ENCOUNTER — Encounter: Payer: Self-pay | Admitting: Family Medicine

## 2022-12-13 VITALS — BP 120/76 | HR 106 | Temp 101.6°F | Wt 169.3 lb

## 2022-12-13 DIAGNOSIS — R509 Fever, unspecified: Secondary | ICD-10-CM | POA: Diagnosis not present

## 2022-12-13 DIAGNOSIS — J020 Streptococcal pharyngitis: Secondary | ICD-10-CM

## 2022-12-13 HISTORY — DX: Streptococcal pharyngitis: J02.0

## 2022-12-13 LAB — POCT RAPID STREP A (OFFICE): Rapid Strep A Screen: POSITIVE — AB

## 2022-12-13 LAB — POC COVID19 BINAXNOW: SARS Coronavirus 2 Ag: NEGATIVE

## 2022-12-13 LAB — POCT INFLUENZA A/B
Influenza A, POC: NEGATIVE
Influenza B, POC: NEGATIVE

## 2022-12-13 MED ORDER — CEFUROXIME AXETIL 500 MG PO TABS: 500.0000 mg | ORAL_TABLET | Freq: Two times a day (BID) | ORAL | 0 refills | Status: AC

## 2022-12-13 NOTE — Progress Notes (Signed)
Subjective:    Patient ID: [name], female    DOB: [birth year], 52 y.o.   MRN: [id number]  HPI Here for 2 days of fever, body aches, and a severe ST. No cough or NVD. Taking Tylenol and Ibuprofen.    Review of Systems  Constitutional:  Positive for fever.  HENT:  Positive for sore throat. Negative for congestion, ear pain, postnasal drip and sinus pressure.   Eyes: Negative.   Respiratory: Negative.    Gastrointestinal: Negative.        Objective:   Physical Exam Constitutional:      Appearance: She is ill-appearing.  HENT:     Right Ear: Tympanic membrane, ear canal and external ear normal.     Left Ear: Tympanic membrane, ear canal and external ear normal.     Nose: Nose normal.     Mouth/Throat:     Pharynx: Oropharyngeal exudate and posterior oropharyngeal erythema present.  Eyes:     Conjunctiva/sclera: Conjunctivae normal.  Pulmonary:     Effort: Pulmonary effort is normal.     Breath sounds: Normal breath sounds.  Lymphadenopathy:     Cervical: Cervical adenopathy present.  Neurological:     Mental Status: She is alert.           Assessment & Plan:  Strep pharyngitis, treat with 10 days of Cefuroxime.  Gershon Crane, MD

## 2022-12-19 ENCOUNTER — Encounter: Payer: Self-pay | Admitting: Student

## 2022-12-19 ENCOUNTER — Ambulatory Visit: Payer: PRIVATE HEALTH INSURANCE | Attending: Cardiology | Admitting: Student

## 2022-12-19 VITALS — BP 115/70 | HR 72 | Ht 64.0 in | Wt 166.0 lb

## 2022-12-19 DIAGNOSIS — R42 Dizziness and giddiness: Secondary | ICD-10-CM | POA: Diagnosis not present

## 2022-12-19 DIAGNOSIS — R002 Palpitations: Secondary | ICD-10-CM | POA: Diagnosis not present

## 2022-12-19 MED ORDER — PROPRANOLOL HCL 20 MG PO TABS: 20.0000 mg | ORAL_TABLET | Freq: Two times a day (BID) | ORAL | 3 refills | Status: AC

## 2022-12-19 NOTE — Progress Notes (Signed)
Electrophysiology Office Note:   Date:  12/19/2022  ID:  ORION OVERBAUGH, DOB [birth year], MRN [id number]  Primary Cardiologist: Chilton Si, MD Electrophysiologist: Lanier Prude, MD      History of Present Illness:   [name] is a 52 y.o. female with h/o PVCs and SVT seen today for routine electrophysiology followup.   Since last being seen in our clinic the patient reports doing well overall. She has palpitations several times a week. The more severe episodes are associated with lightheadedness, and at worst, near syncope. No frank syncope. She has no clear aggravating or relieving factors. She has tried taking her propanolol daily as well as BID, with no clear difference. She has mild peripheral edema, that is not always limited to the evenings. Has worn compression socks with relief.     Review of systems complete and found to be negative unless listed in HPI.   EP Information / Studies Reviewed:    EKG is ordered today. Personal review as below.  EKG Interpretation Date/Time:  Tuesday December 19 2022 12:04:44 EDT Ventricular Rate:  72 PR Interval:  132 QRS Duration:  90 QT Interval:  410 QTC Calculation: 448 R Axis:   70  Text Interpretation: Normal sinus rhythm Possible Left atrial enlargement Nonspecific T wave abnormality Confirmed by Maxine Glenn [phone]) on 12/19/2022 12:11:37 PM    Coronary CT 08/12/2021 Calcium score 0  Monitor 04/2021 Quality: Fair.  Baseline artifact. Predominant rhythm:  Average heart rate: 79 bpm Max heart rate: 193 bpm Min heart rate: 47 bpm Pauses >2.5 seconds: None   6 runs of SVT.  The fastest was at a rate of 193 bpm.  Longest episode was 8 beats. Rare PACs and PVCs  Physical Exam:   VS:  BP 115/70 (BP Location: Left Arm, Patient Position: Sitting, Cuff Size: Normal)   Pulse 72   Ht 5\' 4"  (1.626 m)   Wt 166 lb (75.3 kg)   LMP 03/21/2020   SpO2 97%   BMI 28.49 kg/m    Wt Readings from Last 3 Encounters:   12/19/22 166 lb (75.3 kg)  12/13/22 169 lb 4.8 oz (76.8 kg)  08/23/22 169 lb (76.7 kg)     GEN: Well nourished, well developed in no acute distress NECK: No JVD; No carotid bruits CARDIAC: Regular rate and rhythm, no murmurs, rubs, gallops RESPIRATORY:  Clear to auscultation without rales, wheezing or rhonchi  ABDOMEN: Soft, non-tender, non-distended EXTREMITIES:  No edema; No deformity   ASSESSMENT AND PLAN:    Palpitations Lightheadedness  Has had documented PVCs and SVT by report Overall well controlled on propranolol Have previously discussed trial of diltiazem to see if has better suppression.  She will call back if she wants to try something other than propranolol.  She did not tolerate lopressor well, and she stopped it.  Flecainide was also stopped due to lack of perceived benefit.  Could trial diltiazem or bisoprolol as alternative options.   By symptoms, she may have a degree of dysautonomia.  We discussed the role of salt and water repletion, the importance of exercise, often needing to be started in the recumbent position, and the awareness of triggers and the role of ambient heat and dehydration. We also discussed compression garments to be above the knees.   Follow up with Dr. Lalla Brothers or EP APP in 12 months  Signed, Graciella Freer, PA-C

## 2022-12-19 NOTE — Patient Instructions (Signed)
Medication Instructions:  Your physician recommends that you continue on your current medications as directed. Please refer to the Current Medication list given to you today.  *If you need a refill on your cardiac medications before your next appointment, please call your pharmacy*  Lab Work: None ordered If you have labs (blood work) drawn today and your tests are completely normal, you will receive your results only by: MyChart Message (if you have MyChart) OR A paper copy in the mail If you have any lab test that is abnormal or we need to change your treatment, we will call you to review the results.  Follow-Up: At [facility], you and your health needs are our priority.  As part of our continuing mission to provide you with exceptional heart care, we have created designated Provider Care Teams.  These Care Teams include your primary Cardiologist (physician) and Advanced Practice Providers (APPs -  Physician Assistants and Nurse Practitioners) who all work together to provide you with the care you need, when you need it.   Your next appointment:   1 year(s)  Provider:   Steffanie Dunn, MD

## 2022-12-26 ENCOUNTER — Encounter: Payer: Self-pay | Admitting: Family Medicine

## 2022-12-26 NOTE — Telephone Encounter (Signed)
Pt called back stating she is very worried. Pt stated she felt better for about 5 days, but now she is very swollen on the side of her face, and is experiencing a lot of sore throat pain. Pt is asking for another round of Abx.  Please advise.

## 2022-12-27 ENCOUNTER — Other Ambulatory Visit: Payer: Self-pay | Admitting: Family Medicine

## 2022-12-27 MED ORDER — CEFUROXIME AXETIL 500 MG PO TABS: 500.0000 mg | ORAL_TABLET | Freq: Two times a day (BID) | ORAL | 0 refills | Status: AC

## 2022-12-27 NOTE — Telephone Encounter (Signed)
Pt called in the check on the status of her prescription for cefuroxime. I let her know that msg was received by Dr. Clent Ridges this morning and that her pharmacy would contact her once it was ready for pick up.

## 2022-12-27 NOTE — Telephone Encounter (Signed)
Pt Rx sent, pt aware

## 2022-12-27 NOTE — Telephone Encounter (Signed)
Call in Cefuroxime 500 mg BID for 10 days  

## 2023-01-01 ENCOUNTER — Encounter: Payer: PRIVATE HEALTH INSURANCE | Admitting: Psychology

## 2023-01-16 ENCOUNTER — Ambulatory Visit: Payer: Self-pay | Admitting: Ophthalmology

## 2023-01-16 NOTE — H&P (Signed)
  Date of examination:  01/10/23  Indication for surgery: Small angle exotropia and right hypertropia causing symptomatic diplopia  Pertinent past medical history:  Past Medical History:  Diagnosis Date   ADHD (attention deficit hyperactivity disorder)    pt does not take meds (diagnosed years ago)   Anemia    iron deficiency    Blood transfusion without reported diagnosis    Cardiac arrhythmia    Chronic headaches    Chronic lumbar pain since 2013   sees Dr. Barbette Merino    Clotting disorder [facility])    PE prior to pregnancy.   Complication of anesthesia    Bp drops and alot of shaking after anesthesia   COVID-19 03/01/2020   Family history of adverse reaction to anesthesia    Pt thinks her mother had some cardiac arrhythmias with anesthesia   Interstitial cystitis    Palpitations 02/19/2018   PE (pulmonary embolism)    due to surgery   PONV (postoperative nausea and vomiting)    Sepsis (HCC)    due to surgery   Staph infection    SVT (supraventricular tachycardia) (HCC) 05/16/2021    Pertinent ocular history:  Longstanding worsening small-angle exotropia no longer relieved by spectacle prismatic correction. Patient desires surgery in an attempt to be prism-free. Pt understands may still need prisms postoperatively, but attempt to reduce worth the risks per pt.  Pertinent family history:  Family History  Problem Relation Age of Onset   Colon polyps Mother    Irritable bowel syndrome Mother    Heart defect Mother        tachycardia   Liver disease Father    Hypertension Father    COPD Father    Diabetes Sister    Hypertension Sister    Heart Problems Sister    Irritable bowel syndrome Paternal Aunt    Cancer Paternal Aunt        melanoma   Colon polyps Maternal Grandmother    Hypertension Maternal Grandmother    Diabetes Maternal Grandfather    Heart disease Maternal Grandfather    Hypertension Maternal Grandfather    Colon polyps Paternal Grandmother     Hypertension Paternal Grandmother    Heart defect Paternal Grandmother    Heart attack Paternal Grandmother    Diabetes Paternal Grandfather    Heart disease Paternal Grandfather    Hypertension Paternal Grandfather    Heart attack Paternal Grandfather    Colon cancer Neg Hx    Esophageal cancer Neg Hx    Rectal cancer Neg Hx    Stomach cancer Neg Hx    Crohn's disease Neg Hx    Ulcerative colitis Neg Hx     General:  Healthy appearing patient in no distress.    Eyes:    Acuity OD 20/20  OS 20/20  [place]  External: Within normal limits     Anterior segment: Within normal limits     Motility:   10pd XT with 2pd RHT after patching  Impression:52yo female with small exotropia and hypertropia desiring surgery to reduce or eliminate need for prismatic correction  Plan: BLR tenotomy with LIR tenotomy  M. Rodman Pickle, MD

## 2023-01-16 NOTE — H&P (View-Only) (Signed)
  Date of examination:  01/10/23  Indication for surgery: Small angle exotropia and right hypertropia causing symptomatic diplopia  Pertinent past medical history:  Past Medical History:  Diagnosis Date   ADHD (attention deficit hyperactivity disorder)    pt does not take meds (diagnosed years ago)   Anemia    iron deficiency    Blood transfusion without reported diagnosis    Cardiac arrhythmia    Chronic headaches    Chronic lumbar pain since 2013   sees Dr. Barbette Merino    Clotting disorder [facility])    PE prior to pregnancy.   Complication of anesthesia    Bp drops and alot of shaking after anesthesia   COVID-19 03/01/2020   Family history of adverse reaction to anesthesia    Pt thinks her mother had some cardiac arrhythmias with anesthesia   Interstitial cystitis    Palpitations 02/19/2018   PE (pulmonary embolism)    due to surgery   PONV (postoperative nausea and vomiting)    Sepsis (HCC)    due to surgery   Staph infection    SVT (supraventricular tachycardia) (HCC) 05/16/2021    Pertinent ocular history:  Longstanding worsening small-angle exotropia no longer relieved by spectacle prismatic correction. Patient desires surgery in an attempt to be prism-free. Pt understands may still need prisms postoperatively, but attempt to reduce worth the risks per pt.  Pertinent family history:  Family History  Problem Relation Age of Onset   Colon polyps Mother    Irritable bowel syndrome Mother    Heart defect Mother        tachycardia   Liver disease Father    Hypertension Father    COPD Father    Diabetes Sister    Hypertension Sister    Heart Problems Sister    Irritable bowel syndrome Paternal Aunt    Cancer Paternal Aunt        melanoma   Colon polyps Maternal Grandmother    Hypertension Maternal Grandmother    Diabetes Maternal Grandfather    Heart disease Maternal Grandfather    Hypertension Maternal Grandfather    Colon polyps Paternal Grandmother     Hypertension Paternal Grandmother    Heart defect Paternal Grandmother    Heart attack Paternal Grandmother    Diabetes Paternal Grandfather    Heart disease Paternal Grandfather    Hypertension Paternal Grandfather    Heart attack Paternal Grandfather    Colon cancer Neg Hx    Esophageal cancer Neg Hx    Rectal cancer Neg Hx    Stomach cancer Neg Hx    Crohn's disease Neg Hx    Ulcerative colitis Neg Hx     General:  Healthy appearing patient in no distress.    Eyes:    Acuity OD 20/20  OS 20/20  [place]  External: Within normal limits     Anterior segment: Within normal limits     Motility:   10pd XT with 2pd RHT after patching  Impression:52yo female with small exotropia and hypertropia desiring surgery to reduce or eliminate need for prismatic correction  Plan: BLR tenotomy with LIR tenotomy  M. Rodman Pickle, MD

## 2023-01-17 ENCOUNTER — Encounter (HOSPITAL_COMMUNITY): Payer: Self-pay | Admitting: Ophthalmology

## 2023-01-17 ENCOUNTER — Other Ambulatory Visit: Payer: Self-pay

## 2023-01-17 NOTE — Anesthesia Preprocedure Evaluation (Addendum)
Anesthesia Evaluation  Patient identified by MRN, date of birth, ID band Patient awake    Reviewed: Allergy & Precautions, NPO status , Patient's Chart, lab work & pertinent test results  History of Anesthesia Complications (+) PONV and history of anesthetic complications  Airway Mallampati: II  TM Distance: >3 FB Neck ROM: Full    Dental  (+) Teeth Intact, Dental Advisory Given   Pulmonary    breath sounds clear to auscultation       Cardiovascular hypertension, + dysrhythmias  Rhythm:Regular Rate:Normal     Neuro/Psych  Headaches PSYCHIATRIC DISORDERS Anxiety Depression       GI/Hepatic negative GI ROS, Neg liver ROS,,,  Endo/Other  negative endocrine ROS    Renal/GU negative Renal ROS     Musculoskeletal negative musculoskeletal ROS (+)    Abdominal   Peds  Hematology  (+) Blood dyscrasia, anemia   Anesthesia Other Findings   Reproductive/Obstetrics                             Anesthesia Physical Anesthesia Plan  ASA: 2  Anesthesia Plan: General   Post-op Pain Management: Tylenol PO (pre-op)*   Induction: Intravenous  PONV Risk Score and Plan: 4 or greater and Ondansetron, Dexamethasone, Midazolam and Scopolamine patch - Pre-op  Airway Management Planned: LMA  Additional Equipment: None  Intra-op Plan:   Post-operative Plan: Extubation in OR  Informed Consent: I have reviewed the patients History and Physical, chart, labs and discussed the procedure including the risks, benefits and alternatives for the proposed anesthesia with the patient or authorized representative who has indicated his/her understanding and acceptance.     Dental advisory given  Plan Discussed with: CRNA  Anesthesia Plan Comments: (PAT note by Antionette Poles, PA-C:  Follows with cardiology for history of HTN, SVT, palpitations, PE (after breast reduction surgery in 2000).  Echo 02/2020 showed EF 70  to 75%, mild MR.  Event monitor 03/2021 showed 6 runs of SVT, fastest at a rate of 193, longest episode 8 beats, rare PACs and PVCs.  Coronary CT 08/2021 with a calcium score of 0.  Last seen by Otilio Saber, PA-C 12/19/2022.  PVCs and SVT well-controlled on propranolol.  History of C4-6 ACDF 10/17/2021.  Patient will need day of surgery labs and evaluation.  EKG 12/19/22: NSR.  Rate 72.  Possible LAE.  Nonspecific T wave abnormality.  Coronary CT 08/12/2021: IMPRESSION: 1. Calcium score 0  2. Normal right dominant coronary arteries  3. Normal ascending thoracic aorta 2.9 cm  7-day event monitor 04/05/2021: Quality: Fair. Baseline artifact. Predominant rhythm:  Average heart rate: 79 bpm Max heart rate: 193 bpm Min heart rate: 47 bpm Pauses >2.5 seconds: None  6 runs of SVT. The fastest was at a rate of 193 bpm. Longest episode was 8 beats. Rare PACs and PVCs  TTE 02/25/2020: 1. Left ventricular ejection fraction, by estimation, is 70 to 75%. The  left ventricle has hyperdynamic function. The left ventricle has no  regional wall motion abnormalities. Left ventricular diastolic parameters  are indeterminate.  2. Right ventricular systolic function is normal. The right ventricular  size is normal. There is normal pulmonary artery systolic pressure. The  estimated right ventricular systolic pressure is 30.8 mmHg.  3. The mitral valve is grossly normal. Mild mitral valve regurgitation.  4. The aortic valve is tricuspid. Aortic valve regurgitation is not  visualized.  5. The inferior vena cava is dilated in size  with >50% respiratory  variability, suggesting right atrial pressure of 8 mmHg.     )        Anesthesia Quick Evaluation

## 2023-01-17 NOTE — Progress Notes (Signed)
PCP - Dr Larey Dresser Cardiologist - Dr Chilton Si EP:  Dr Steffanie Dunn  Coronary CT - 08/12/21 EKG - 12/19/22 Stress Test -  CCTA 08/12/21 ECHO - 02/25/20 Cardiac Cath - n/a  ICD Pacemaker/Loop - n/a  Sleep Study -  n/a  Diabetes - n/a  NPO   Anesthesia review: Yes  STOP now taking any Aspirin (unless otherwise instructed by your surgeon), Aleve, Naproxen, Ibuprofen, Motrin, Advil, Goody's, BC's, all herbal medications, fish oil, and all vitamins.   Positive strep on 12/13/22, (2) treatments with Zinacef x 10 days ea.  Last strep symptoms were on 01/01/23.     Coronavirus Screening Do you have any of the following symptoms:  Cough yes/no: No Fever (>100.30F)  yes/no: No Runny nose yes/no: No Sore throat yes/no: No Difficulty breathing/shortness of breath  yes/no: No  Have you traveled in the last 14 days and where? yes/no: No  Patient verbalized understanding of instructions that were given via phone.

## 2023-01-17 NOTE — Progress Notes (Signed)
Anesthesia Chart Review: Same day workup  Follows with cardiology for history of HTN, SVT, palpitations, PE (after breast reduction surgery in 2000).  Echo 02/2020 showed EF 70 to 75%, mild MR.  Event monitor 03/2021 showed 6 runs of SVT, fastest at a rate of 193, longest episode 8 beats, rare PACs and PVCs.  Coronary CT 08/2021 with a calcium score of 0.  Last seen by Otilio Saber, PA-C 12/19/2022.  PVCs and SVT well-controlled on propranolol.   History of C4-6 ACDF 10/17/2021.  Patient will need day of surgery labs and evaluation.   EKG 12/19/22: NSR.  Rate 72.  Possible LAE.  Nonspecific T wave abnormality.   Coronary CT 08/12/2021: IMPRESSION: 1. Calcium score 0   2.  Normal right dominant coronary arteries   3.  Normal ascending thoracic aorta 2.9 cm   7-day event monitor 04/05/2021: Quality: Fair.  Baseline artifact. Predominant rhythm:  Average heart rate: 79 bpm Max heart rate: 193 bpm Min heart rate: 47 bpm Pauses >2.5 seconds: None   6 runs of SVT.  The fastest was at a rate of 193 bpm.  Longest episode was 8 beats. Rare PACs and PVCs   TTE 02/25/2020:  1. Left ventricular ejection fraction, by estimation, is 70 to 75%. The  left ventricle has hyperdynamic function. The left ventricle has no  regional wall motion abnormalities. Left ventricular diastolic parameters  are indeterminate.   2. Right ventricular systolic function is normal. The right ventricular  size is normal. There is normal pulmonary artery systolic pressure. The  estimated right ventricular systolic pressure is 30.8 mmHg.   3. The mitral valve is grossly normal. Mild mitral valve regurgitation.   4. The aortic valve is tricuspid. Aortic valve regurgitation is not  visualized.   5. The inferior vena cava is dilated in size with >50% respiratory  variability, suggesting right atrial pressure of 8 mmHg.      Zannie Cove [facility]/Anesthesiology Phone [phone] 01/17/2023 1:03  PM

## 2023-01-19 ENCOUNTER — Ambulatory Visit (HOSPITAL_COMMUNITY): Payer: PRIVATE HEALTH INSURANCE | Admitting: Physician Assistant

## 2023-01-19 ENCOUNTER — Ambulatory Visit (HOSPITAL_BASED_OUTPATIENT_CLINIC_OR_DEPARTMENT_OTHER): Payer: PRIVATE HEALTH INSURANCE | Admitting: Physician Assistant

## 2023-01-19 ENCOUNTER — Ambulatory Visit (HOSPITAL_COMMUNITY)
Admission: RE | Admit: 2023-01-19 | Discharge: 2023-01-19 | Disposition: A | Discharge status: Home / Self Care | Payer: PRIVATE HEALTH INSURANCE | Attending: Ophthalmology | Admitting: Ophthalmology

## 2023-01-19 ENCOUNTER — Encounter (HOSPITAL_COMMUNITY): Payer: Self-pay | Admitting: Ophthalmology

## 2023-01-19 ENCOUNTER — Encounter (HOSPITAL_COMMUNITY)
Admission: RE | Disposition: A | Discharge status: Home / Self Care | Payer: Self-pay | Source: Home / Self Care | Attending: Ophthalmology

## 2023-01-19 DIAGNOSIS — I471 Supraventricular tachycardia, unspecified: Secondary | ICD-10-CM | POA: Insufficient documentation

## 2023-01-19 DIAGNOSIS — H5010 Unspecified exotropia: Secondary | ICD-10-CM | POA: Insufficient documentation

## 2023-01-19 DIAGNOSIS — I493 Ventricular premature depolarization: Secondary | ICD-10-CM | POA: Diagnosis not present

## 2023-01-19 DIAGNOSIS — H5021 Vertical strabismus, right eye: Secondary | ICD-10-CM | POA: Insufficient documentation

## 2023-01-19 DIAGNOSIS — I10 Essential (primary) hypertension: Secondary | ICD-10-CM | POA: Diagnosis not present

## 2023-01-19 HISTORY — PX: STRABISMUS SURGERY: SHX218

## 2023-01-19 LAB — CBC
HCT: 38.4 % (ref 36.0–46.0)
Hemoglobin: 12.6 g/dL (ref 12.0–15.0)
MCH: 30.1 pg (ref 26.0–34.0)
MCHC: 32.8 g/dL (ref 30.0–36.0)
MCV: 91.9 fL (ref 80.0–100.0)
Platelets: 268 10*3/uL (ref 150–400)
RBC: 4.18 MIL/uL (ref 3.87–5.11)
RDW: 13 % (ref 11.5–15.5)
WBC: 4.2 10*3/uL (ref 4.0–10.5)
nRBC: 0 % (ref 0.0–0.2)

## 2023-01-19 LAB — SURGICAL PCR SCREEN
MRSA, PCR: NEGATIVE
Staphylococcus aureus: NEGATIVE

## 2023-01-19 SURGERY — STRABISMUS SURGERY, BILATERAL
Anesthesia: General | Site: Eye | Laterality: Bilateral

## 2023-01-19 MED ORDER — BSS IO SOLN
INTRAOCULAR | Status: DC | PRN
  Administered 2023-01-19: 15 mL via INTRAOCULAR

## 2023-01-19 MED ORDER — BSS IO SOLN
INTRAOCULAR | Status: AC
  Filled 2023-01-19: qty 15

## 2023-01-19 MED ORDER — FENTANYL CITRATE (PF) 250 MCG/5ML IJ SOLN
INTRAMUSCULAR | Status: AC
  Filled 2023-01-19: qty 5

## 2023-01-19 MED ORDER — ACETAMINOPHEN 325 MG PO TABS: 325.0000 mg | ORAL_TABLET | ORAL | Status: DC | PRN

## 2023-01-19 MED ORDER — TOBRAMYCIN-DEXAMETHASONE 0.3-0.1 % OP SUSP
OPHTHALMIC | Status: DC | PRN
  Administered 2023-01-19: 1 [drp]

## 2023-01-19 MED ORDER — TOBRAMYCIN-DEXAMETHASONE 0.3-0.1 % OP SUSP: 1.0000 [drp] | Freq: Four times a day (QID) | OPHTHALMIC | Status: DC

## 2023-01-19 MED ORDER — BUPIVACAINE HCL (PF) 0.5 % IJ SOLN
INTRAMUSCULAR | Status: DC | PRN
  Administered 2023-01-19: 1.5 mL

## 2023-01-19 MED ORDER — OXYCODONE HCL 5 MG/5ML PO SOLN
5.0000 mg | Freq: Once | ORAL | Status: DC | PRN
Start: 2023-01-19 — End: 2023-01-19

## 2023-01-19 MED ORDER — ONDANSETRON HCL 4 MG/2ML IJ SOLN
INTRAMUSCULAR | Status: DC | PRN
  Administered 2023-01-19: 4 mg via INTRAVENOUS

## 2023-01-19 MED ORDER — ACETAMINOPHEN 160 MG/5ML PO SOLN: 325.0000 mg | ORAL | Status: DC | PRN

## 2023-01-19 MED ORDER — ACETAMINOPHEN 10 MG/ML IV SOLN: 1000.0000 mg | Freq: Once | INTRAVENOUS | Status: DC | PRN

## 2023-01-19 MED ORDER — SODIUM CHLORIDE 0.9 % IV SOLN: INTRAVENOUS | Status: DC | PRN

## 2023-01-19 MED ORDER — OXYCODONE HCL 5 MG PO TABS: 5.0000 mg | ORAL_TABLET | Freq: Once | ORAL | Status: DC | PRN

## 2023-01-19 MED ORDER — ACETAMINOPHEN 10 MG/ML IV SOLN
INTRAVENOUS | Status: DC | PRN
  Administered 2023-01-19: 1000 mg via INTRAVENOUS

## 2023-01-19 MED ORDER — ACETAMINOPHEN 10 MG/ML IV SOLN
INTRAVENOUS | Status: AC
  Filled 2023-01-19: qty 100

## 2023-01-19 MED ORDER — PHENYLEPHRINE HCL 10 % OP SOLN
1.0000 [drp] | OPHTHALMIC | Status: DC | PRN
  Administered 2023-01-19: 1 [drp] via OPHTHALMIC
  Filled 2023-01-19: qty 5

## 2023-01-19 MED ORDER — NEOMYCIN-POLYMYXIN-DEXAMETH 3.5-10000-0.1 OP OINT
TOPICAL_OINTMENT | OPHTHALMIC | Status: DC | PRN
  Administered 2023-01-19: 1 via OPHTHALMIC

## 2023-01-19 MED ORDER — NEOMYCIN-POLYMYXIN-DEXAMETH 3.5-10000-0.1 OP OINT
TOPICAL_OINTMENT | OPHTHALMIC | Status: AC
  Filled 2023-01-19: qty 3.5

## 2023-01-19 MED ORDER — CHLORHEXIDINE GLUCONATE 0.12 % MT SOLN
15.0000 mL | Freq: Once | OROMUCOSAL | Status: AC
Start: 2023-01-19 — End: 2023-01-19
  Administered 2023-01-19: 15 mL via OROMUCOSAL
  Filled 2023-01-19: qty 15

## 2023-01-19 MED ORDER — LIDOCAINE 2% (20 MG/ML) 5 ML SYRINGE
INTRAMUSCULAR | Status: DC | PRN
  Administered 2023-01-19: 40 mg via INTRAVENOUS

## 2023-01-19 MED ORDER — PROPOFOL 10 MG/ML IV BOLUS
INTRAVENOUS | Status: DC | PRN
  Administered 2023-01-19: 200 mg via INTRAVENOUS

## 2023-01-19 MED ORDER — MIDAZOLAM HCL 2 MG/2ML IJ SOLN
INTRAMUSCULAR | Status: DC | PRN
  Administered 2023-01-19: 2 mg via INTRAVENOUS

## 2023-01-19 MED ORDER — MIDAZOLAM HCL 2 MG/2ML IJ SOLN
INTRAMUSCULAR | Status: AC
Start: 2023-01-19 — End: ?
  Filled 2023-01-19: qty 2

## 2023-01-19 MED ORDER — DEXAMETHASONE SODIUM PHOSPHATE 10 MG/ML IJ SOLN
INTRAMUSCULAR | Status: DC | PRN
  Administered 2023-01-19: 10 mg via INTRAVENOUS

## 2023-01-19 MED ORDER — BUPIVACAINE HCL (PF) 0.5 % IJ SOLN
INTRAMUSCULAR | Status: AC
  Filled 2023-01-19: qty 30

## 2023-01-19 MED ORDER — DROPERIDOL 2.5 MG/ML IJ SOLN
INTRAMUSCULAR | Status: AC
  Filled 2023-01-19: qty 2

## 2023-01-19 MED ORDER — FENTANYL CITRATE (PF) 100 MCG/2ML IJ SOLN: 25.0000 ug | INTRAMUSCULAR | Status: DC | PRN

## 2023-01-19 MED ORDER — TOBRAMYCIN-DEXAMETHASONE 0.3-0.1 % OP SUSP
OPHTHALMIC | Status: AC
  Filled 2023-01-19: qty 2.5

## 2023-01-19 MED ORDER — FENTANYL CITRATE (PF) 250 MCG/5ML IJ SOLN
INTRAMUSCULAR | Status: DC | PRN
  Administered 2023-01-19: 50 ug via INTRAVENOUS
  Administered 2023-01-19: 25 ug via INTRAVENOUS
  Administered 2023-01-19: 50 ug via INTRAVENOUS
  Administered 2023-01-19 (×3): 25 ug via INTRAVENOUS

## 2023-01-19 MED ORDER — DEXMEDETOMIDINE HCL IN NACL 80 MCG/20ML IV SOLN
INTRAVENOUS | Status: DC | PRN
  Administered 2023-01-19 (×2): 8 ug via INTRAVENOUS

## 2023-01-19 MED ORDER — PROPOFOL 10 MG/ML IV BOLUS
INTRAVENOUS | Status: AC
  Filled 2023-01-19: qty 20

## 2023-01-19 MED ORDER — SCOPOLAMINE 1 MG/3DAYS TD PT72
1.0000 | MEDICATED_PATCH | TRANSDERMAL | Status: DC
Start: 2023-01-19 — End: 2023-01-19
  Administered 2023-01-19: 1.5 mg via TRANSDERMAL
  Filled 2023-01-19: qty 1

## 2023-01-19 MED ORDER — DROPERIDOL 2.5 MG/ML IJ SOLN
0.6250 mg | Freq: Once | INTRAMUSCULAR | Status: AC | PRN
Start: 2023-01-19 — End: 2023-01-19
  Administered 2023-01-19: 0.625 mg via INTRAVENOUS

## 2023-01-19 MED ORDER — ORAL CARE MOUTH RINSE: 15.0000 mL | Freq: Once | OROMUCOSAL | Status: AC

## 2023-01-19 SURGICAL SUPPLY — 26 items
APPLICATOR DR MATTHEWS STRL (MISCELLANEOUS) ×1 IMPLANT
BAG COUNTER SPONGE SURGICOUNT (BAG) ×1 IMPLANT
BNDG EYE OVAL 2 1/8 X 2 5/8 (GAUZE/BANDAGES/DRESSINGS) IMPLANT
CAUTERY EYE LOW TEMP 1300F FIN (OPHTHALMIC RELATED) IMPLANT
CORD BIPOLAR FORCEPS 12FT (ELECTRODE) ×1 IMPLANT
COVER BACK TABLE 60X90IN (DRAPES) ×1 IMPLANT
COVER MAYO STAND STRL (DRAPES) ×1 IMPLANT
COVER SURGICAL LIGHT HANDLE (MISCELLANEOUS) ×1 IMPLANT
DRAPE EENT ADH APERT 15X15 STR (DRAPES) IMPLANT
DRAPE SURG 17X23 STRL (DRAPES) ×1 IMPLANT
DRAPE SURG ORHT 6 SPLT 77X108 (DRAPES) ×1 IMPLANT
GLOVE BIO SURGEON STRL SZ7 (GLOVE) ×1 IMPLANT
GOWN STRL REUS W/ TWL LRG LVL3 (GOWN DISPOSABLE) ×2 IMPLANT
GOWN STRL REUS W/TWL LRG LVL3 (GOWN DISPOSABLE) ×2
NDL 18GX1X1/2 (RX/OR ONLY) (NEEDLE) ×1 IMPLANT
NEEDLE 18GX1X1/2 (RX/OR ONLY) (NEEDLE) ×1
NS IRRIG 1000ML POUR BTL (IV SOLUTION) ×1 IMPLANT
SHIELD EYE PEDIATRIC STRL (MISCELLANEOUS) ×2 IMPLANT
SPEAR EYE SURG WECK-CEL (MISCELLANEOUS) ×1 IMPLANT
STRIP CLOSURE SKIN 1/4X4 (GAUZE/BANDAGES/DRESSINGS) IMPLANT
SUT CHROMIC 7 0 TG140 8 (SUTURE) ×1 IMPLANT
SUT SILK 4 0 RB 1 (SUTURE) IMPLANT
SUT VICRYL ABS 6-0 S29 18IN (SUTURE) ×1 IMPLANT
SYR 10ML LL (SYRINGE) ×1 IMPLANT
SYR 3ML LL SCALE MARK (SYRINGE) ×1 IMPLANT
TOWEL GREEN STERILE FF (TOWEL DISPOSABLE) ×1 IMPLANT

## 2023-01-19 NOTE — Anesthesia Postprocedure Evaluation (Signed)
Anesthesia Post Note  Patient: [name]  Procedure(s) Performed: REPAIR STRABISMUS BILATERAL (Bilateral: Eye)     Patient location during evaluation: PACU Anesthesia Type: General Level of consciousness: awake and alert Pain management: pain level controlled Vital Signs Assessment: post-procedure vital signs reviewed and stable Respiratory status: spontaneous breathing, nonlabored ventilation, respiratory function stable and patient connected to nasal cannula oxygen Cardiovascular status: blood pressure returned to baseline and stable Postop Assessment: no apparent nausea or vomiting Anesthetic complications: no  No notable events documented.  Last Vitals:  Vitals:   01/19/23 1030 01/19/23 1045  BP: (!) 93/51 (!) 96/58  Pulse: 84 77  Resp: 15 18  Temp:  36.7 C  SpO2: 95% 99%    Last Pain:  Vitals:   01/19/23 1045  TempSrc:   PainSc: 0-No pain                 Shelton Silvas

## 2023-01-19 NOTE — Interval H&P Note (Signed)
History and Physical Interval Note:  01/19/2023 7:18 AM  [name]  has presented today for surgery, with the diagnosis of exotropia.  The various methods of treatment have been discussed with the patient and family. After consideration of risks, benefits and other options for treatment, the patient has consented to  Procedure(s): REPAIR STRABISMUS BILATERAL (Bilateral) as a surgical intervention.  The patient's history has been reviewed, patient examined, no change in status, stable for surgery.  I have reviewed the patient's chart and labs.  Questions were answered to the patient's satisfaction.     French Ana

## 2023-01-19 NOTE — Discharge Instructions (Signed)
Diet: Clear liquids, advance to soft foods then regular diet as tolerated.  Pain control: Overlapping ibuprofen (aka Advil, Motrin) with acetaminophen (aka Tylenol, Excedrin) has been clinically proven as effective for pain as morphine.  1)  Ibuprofen 600 mg by mouth every 6 hours as needed for pain   Do not take this medication if you already take NSAIDs (such as naproxen/Aleve or Surveyor, quantity).  2)  Acetaminophen 325 one or two by mouth every 4-6 hours as needed for pain that is not resolved by ibuprofen   Do not take this medication if you already take acetaminophen within another medication (such as Percocet/Lortab).  Eye medications:  Antibiotic eye drops or ointment, one drop or application in the operated eye(s) 4 times a day for 10 days.    Activity: No swimming for 1 week. It is OK to let water run over the face and eyes while showering or taking a bath, even during the first week.  No other restriction on exercise or activity.  Eye movement: The eyes may look very slightly crossed in or turned out, and you may experience temporary double vision (diplopia). This is not unusual postoperatively and may happen up to two months after surgery while the muscles are healing. The eyes may be tired during the first few weeks after surgery; reading can be uncomfortable during the healing process but will not hurt the eyes.  Call Dr. Eliane Decree office [phone] with any problems or concerns.

## 2023-01-19 NOTE — Op Note (Signed)
01/19/2023  10:09 AM  PATIENT:  [name]  52 y.o. female  PRE-OPERATIVE DIAGNOSIS:   1. Small-angle exotropia  2. Right micro-hypertropia  POST-OPERATIVE DIAGNOSIS:  Same  PROCEDURE:  Lateral rectus muscle recession 4mm left; Inferior rectus muscle tenotomy  SURGEON:  Dierdre Harness, M.D.   ANESTHESIA: General LMA and local subTenons bupivicaine  COMPLICATIONS: None immediate  DESCRIPTION OF PROCEDURE: The patient was taken to the operating room where She was identified by me. General anesthesia was induced without difficulty after placement of appropriate monitors. The patient was prepped and draped in the usual sterile ophthalmic fashion. Maxitrol eye ointment was placed in both eyes for corneal protection during the case. Forced ductions were unremarkable.   A lid speculum was placed in the left eye. Through an inferotemporal fornix incision through conjunctiva and Tenon's fascia, the left inferior rectus muscle was isolated on a series of muscle hooks and secure with a curved locking forcep. Two small hooks were used to trisect the muscle, being careful to include anterior arteries in the lateral sections. Wescott scissors were used to create a tenotomy of the isolated central third of the muscle. Minimal bleeding was encountered and light cautery used to achieve hemostasis. The hooks were removed and the muscle observed to ensure good continued attachment at the lateral thirds. Once confirmed complete with good muscle attachement, attention was then turned to the left lateral rectus muscle, which was engaged on a series of muscle hooks and cleared of its fascial attachments. The tendon was secured with a double-armed 6-0 Vicryl suture with a double locking bite at each border of the muscle, 1 mm from the insertion. The muscle was disinserted, and was reattached to sclera at a measured distance of 4 millimeters posterior to the original insertion, using direct scleral passes in  box fashion to avoid the retro-muscular sclera.  The suture ends were tied securely after the position of the muscle had been checked and found to be accurate. 1.65mL of bupivacaine 0.5% was diffused into the sub-Tenons space for perioperative anesthesia. Conjunctiva was closed with 4 7-0 Chromic sutures.  Two drops of dilute betadine were placed on the left eye and rinsed after ten seconds; Maxitrol drops were placed in the left eye. The patient was awakened without difficulty and taken to the recovery room in stable condition, having suffered no intraoperative or immediate postoperative complications.  Alda Ponder.D.

## 2023-01-19 NOTE — Anesthesia Procedure Notes (Signed)
Procedure Name: LMA Insertion Date/Time: 01/19/2023 9:25 AM  Performed by: Loleta Ezzie Senat, CRNAPre-anesthesia Checklist: Patient identified, Patient being monitored, Timeout performed, Emergency Drugs available and Suction available Patient Re-evaluated:Patient Re-evaluated prior to induction Oxygen Delivery Method: Circle system utilized Preoxygenation: Pre-oxygenation with 100% oxygen Induction Type: IV induction Ventilation: Mask ventilation without difficulty LMA: LMA inserted LMA Size: 4.0 Tube type: Oral Number of attempts: 1 Placement Confirmation: positive ETCO2 and breath sounds checked- equal and bilateral Tube secured with: Tape Dental Injury: Teeth and Oropharynx as per pre-operative assessment

## 2023-01-19 NOTE — Transfer of Care (Signed)
Immediate Anesthesia Transfer of Care Note  Patient: [name]  Procedure(s) Performed: REPAIR STRABISMUS BILATERAL (Bilateral: Eye)  Patient Location: PACU  Anesthesia Type:General  Level of Consciousness: drowsy and responds to stimulation  Airway & Oxygen Therapy: Patient Spontanous Breathing  Post-op Assessment: Report given to RN and Post -op Vital signs reviewed and stable  Post vital signs: Reviewed and stable  Last Vitals:  Vitals Value Taken Time  BP 110/64 01/19/23 1015  Temp    Pulse 94 01/19/23 1017  Resp 20 01/19/23 1017  SpO2 97 % 01/19/23 1017  Vitals shown include unfiled device data.  Last Pain:  Vitals:   01/19/23 0752  TempSrc:   PainSc: 0-No pain         Complications: No notable events documented.

## 2023-01-20 ENCOUNTER — Encounter (HOSPITAL_COMMUNITY): Payer: Self-pay | Admitting: Ophthalmology

## 2023-01-22 ENCOUNTER — Ambulatory Visit (INDEPENDENT_AMBULATORY_CARE_PROVIDER_SITE_OTHER): Payer: PRIVATE HEALTH INSURANCE | Admitting: Gastroenterology

## 2023-01-22 ENCOUNTER — Encounter: Payer: Self-pay | Admitting: Gastroenterology

## 2023-01-22 VITALS — BP 110/70 | HR 78 | Ht 64.0 in | Wt 171.0 lb

## 2023-01-22 DIAGNOSIS — K5909 Other constipation: Secondary | ICD-10-CM

## 2023-01-22 DIAGNOSIS — Z791 Long term (current) use of non-steroidal anti-inflammatories (NSAID): Secondary | ICD-10-CM

## 2023-01-22 DIAGNOSIS — R1013 Epigastric pain: Secondary | ICD-10-CM

## 2023-01-22 DIAGNOSIS — R140 Abdominal distension (gaseous): Secondary | ICD-10-CM | POA: Diagnosis not present

## 2023-01-22 MED ORDER — POLYETHYLENE GLYCOL 3350 17 G PO PACK: 17.0000 g | PACK | Freq: Every day | ORAL | Status: AC | PRN

## 2023-01-22 MED ORDER — LINACLOTIDE 290 MCG PO CAPS: 290.0000 ug | ORAL_CAPSULE | Freq: Every day | ORAL | 0 refills | Status: DC

## 2023-01-22 MED ORDER — IBGARD 90 MG PO CPCR: ORAL_CAPSULE | ORAL | Status: AC

## 2023-01-22 MED ORDER — LINACLOTIDE 145 MCG PO CAPS: 145.0000 ug | ORAL_CAPSULE | Freq: Every day | ORAL | 0 refills | Status: DC

## 2023-01-22 MED ORDER — OMEPRAZOLE 20 MG PO CPDR: 20.0000 mg | DELAYED_RELEASE_CAPSULE | Freq: Every day | ORAL | 1 refills | Status: DC

## 2023-01-22 NOTE — Progress Notes (Signed)
HPI :  52 year old female here for follow-up visit to discuss chronic constipation, abdominal discomfort.  I last saw her for colonoscopy in May of this past year.  She had a few small polyps removed, 1 of which was sessile serrated, diverticulosis, otherwise no high risk or concerning lesions.  She had baseline has had chronic constipation and typically has a bowel movement once every week or so.  At baseline she states she has never really been bothered with abdominal symptoms if she moves her bowels once per week.  For the past year or so she has had decreased stool frequency to perhaps every 2 weeks or even longer.  She now does not tolerate this level of constipation and she has abdominal bloating and spasms in her lower to lateral abdomen.  She denies any weight loss, in fact has gained a few pounds over the past 3 to 6 months.  She denies any blood in her stools.  Her symptoms of abdominal discomfort can improve with a bowel movement sometimes do not completely resolve.  She has been trying to manage her bowels with diet.  Previously has tried MiraLAX once daily, that really did not help to do much for her.  Her stool form is also alternated in regards to how thick or thin her stools are.  She denies any new medications over the past year to be influencing her bowel habits.  She has had surgery on her neck and has shoulder pain and neck pain.  She has been taking ibuprofen 800 mg at least once if not twice daily.  She had previously switched to meloxicam but had severe epigastric discomfort on the regimen and switch back to ibuprofen.  She is not taking any PPI prophylaxis.  Recall she had an EGD in 2021 for iron deficiency given her colonoscopy did not show a clear cause.  She tested positive for H. pylori at the time and was treated, follow-up stool and testing was negative off PPI.  She denies any blood in her stools.  She does have some occasional epigastric discomfort with eating at times.   Does not vomit   Prior workup: Colonoscopy 07/18/22: - The perianal and digital rectal examinations were normal. - The colon was quite tortuous. Pediatric colonoscope used to complete the exam. - A few small angiodysplastic lesions were found in the proximal transverse colon, in the ascending colon and in the cecum. - A 3 mm polyp was found in the cecum. The polyp was sessile. The polyp was removed with a cold snare. Resection and retrieval were complete. - A 3 mm polyp was found in the transverse colon. The polyp was flat. The polyp was removed with a cold snare. Resection and retrieval were complete. - A few small-mouthed diverticula were found in the sigmoid colon. - The exam was otherwise without abnormality.  Surgical [P], colon, transverse and cecum, polyp (2) SESSILE SERRATED POLYP WITHOUT CYTOLOGICAL DYSPLASIA. ONE FRAGMENT OF POLYPOID COLONIC MUCOSA WITHOUT SIGNIFICANT DIAGNOSTIC ALTERATION.  Repeat in 5 years   EGD 02/17/2020: done for IDA - Esophagogastric landmarks were identified: the Z-line was found at 36 cm, the gastroesophageal junction was found at 36 cm and the upper extent of the gastric folds was found at 36 cm from the incisors. Findings: - The exam of the esophagus was otherwise normal. - Two localized diminutive erosions with no stigmata of recent bleeding were found in the prepyloric region of the stomach. No focal ulcerations. - The exam of the stomach  was otherwise normal. - Biopsies were taken with a cold forceps in the gastric body, at the incisura and in the gastric antrum for Helicobacter pylori testing. - The duodenal bulb and second portion of the duodenum were normal. Biopsies for histology were taken with a cold forceps for evaluation of celiac disease.  1. Surgical [P], random small bowel sites - SMALL INTESTINAL MUCOSA WITH NO SPECIFIC HISTOPATHOLOGIC CHANGES - NEGATIVE FOR INCREASED INTRAEPITHELIAL LYMPHOCYTES OR VILLOUS ARCHITECTURAL CHANGES 2. Surgical [P],  gastric antrum, gastric body - GASTRIC ANTRAL AND OXYNTIC MUCOSA WITH HELICOBACTER PYLORI-ASSOCIATED GASTRITIS (CONFIRMED WITH WARTHIN STARRY STAIN)   H pylori treated for for 14 days: Amoxicillin 1gm BID Clarithromycin 500mg  BID Flagyl 500mg  BID Omeprazole 40mg  BID  H pylori stool study negative 05/2020  S/p hysterectomy   Past Medical History:  Diagnosis Date   ADHD (attention deficit hyperactivity disorder)    pt does not take meds (diagnosed years ago)   Anemia    iron deficiency    Blood transfusion without reported diagnosis    Chronic headaches    Chronic lumbar pain since 2013   sees Dr. Barbette Merino    Clotting disorder [facility])    PE prior to pregnancy.   Complication of anesthesia    Bp drops and alot of shaking after anesthesia   COVID-19 03/01/2020   Dysrhythmia 05/16/2021   SVT and hx palpitations   Family history of adverse reaction to anesthesia    Pt thinks her mother had some cardiac arrhythmias with anesthesia   Interstitial cystitis    Palpitations 02/19/2018   PE (pulmonary embolism) 2000   due to surgery   Pneumonia    x several years ago   PONV (postoperative nausea and vomiting)    Sepsis (HCC)    due to surgery   Staph infection    Streptococcal pharyngitis 12/13/2022   Positive strep on 12/13/22, (2) treatments with Zinacef x 10 days ea.  Last strep symptoms were on 01/01/23.     Past Surgical History:  Procedure Laterality Date   ANTERIOR CERVICAL DECOMP/DISCECTOMY FUSION N/A 10/17/2021   Procedure: ANTERIOR CERVICAL DISCECTOMY AND FUSION FOUR TO SIX;  Surgeon: Venita Lick, MD;  Location: [facility] OR;  Service: Orthopedics;  Laterality: N/A;   BREAST REDUCTION SURGERY     COLONOSCOPY  06/25/2019   per Dr. Adela Lank, adeomatous polyps, repeat in 3 yrs; hx multiple colonoscopies   DILATION AND CURETTAGE OF UTERUS     EXPLORATORY LAPAROTOMY     with left ovary and mass removed   RADIOFREQUENCY ABLATION NERVES     lumbar spine, per Dr. Eduard Clos     ROBOTIC ASSISTED LAPAROSCOPIC HYSTERECTOMY AND SALPINGECTOMY Bilateral 04/14/2020   Procedure: XI ROBOTIC ASSISTED TOTAL LAPAROSCOPIC HYSTERECTOMY AND SALPINGECTOMY;  Surgeon: Genia Del, MD;  Location: [facility] OR;  Service: Gynecology;  Laterality: Bilateral;   SHOULDER ARTHROSCOPY WITH SUBACROMIAL DECOMPRESSION Right 09/14/2020   Procedure: SHOULDER MANIPULATION UNDER ANESTHESIA, ARTHROSCOPIC LYSIS OF ADHESIONS AND SUBACROMIAL DECOMPRESSION;  Surgeon: Yolonda Kida, MD;  Location: [facility] OR;  Service: Orthopedics;  Laterality: Right;    STRABISMUS SURGERY Bilateral 01/19/2023   Procedure: REPAIR STRABISMUS BILATERAL;  Surgeon: French Ana, MD;  Location: [facility] OR;  Service: Ophthalmology;  Laterality: Bilateral;   TUBAL LIGATION     UPPER GASTROINTESTINAL ENDOSCOPY     x multiple   Family History  Problem Relation Age of Onset   Colon polyps Mother    Irritable bowel syndrome Mother    Heart defect Mother  tachycardia   Liver disease Father    Hypertension Father    COPD Father    Diabetes Sister    Hypertension Sister    Heart Problems Sister    Irritable bowel syndrome Paternal Aunt    Cancer Paternal Aunt        melanoma   Colon polyps Maternal Grandmother    Hypertension Maternal Grandmother    Diabetes Maternal Grandfather    Heart disease Maternal Grandfather    Hypertension Maternal Grandfather    Colon polyps Paternal Grandmother    Hypertension Paternal Grandmother    Heart defect Paternal Grandmother    Heart attack Paternal Grandmother    Diabetes Paternal Grandfather    Heart disease Paternal Grandfather    Hypertension Paternal Grandfather    Heart attack Paternal Grandfather    Colon cancer Neg Hx    Esophageal cancer Neg Hx    Rectal cancer Neg Hx    Stomach cancer Neg Hx    Crohn's disease Neg Hx    Ulcerative colitis Neg Hx    Social History   Tobacco Use   Smoking status: Never   Smokeless tobacco: Never  Vaping Use    Vaping status: Never Used  Substance Use Topics   Alcohol use: Not Currently    Comment: glass of wine occasionally (maybe once a month)   Drug use: No   Current Outpatient Medications  Medication Sig Dispense Refill   acetaminophen (TYLENOL) 500 MG tablet Take 1,000 mg by mouth every 6 (six) hours as needed (pain.).     cyanocobalamin (VITAMIN B12) 1000 MCG/ML injection INJECT 1 ML INTO SKIN WEEKLY (Patient taking differently: Inject 1 mL into skin weekly on Sunday) 9 mL 1   ibuprofen (ADVIL) 800 MG tablet Take 800 mg by mouth every 8 (eight) hours as needed for moderate pain (pain score 4-6).     LORazepam (ATIVAN) 1 MG tablet TAKE 1 TO 2 TABLETS BY MOUTH AT BEDTIME AS NEEDED FOR SLEEP (Patient taking differently: Take 1-1.5 mg by mouth at bedtime.) 60 tablet 5   mirtazapine (REMERON) 15 MG tablet TAKE 1 TABLET(15 MG) BY MOUTH AT BEDTIME 90 tablet 3   ondansetron (ZOFRAN) 4 MG tablet Take 1 tablet (4 mg total) by mouth every 8 (eight) hours as needed for nausea or vomiting. 20 tablet 0   Oral Electrolytes (ELECTROLYTE SR PO) Take 1 Dose by mouth daily as needed (hydration.).     Polyethyl Glycol-Propyl Glycol (SYSTANE) 0.4-0.3 % SOLN Place 1 drop into both eyes 3 (three) times daily as needed (dry/irritated eyes.).     propranolol (INDERAL) 20 MG tablet Take 1 tablet (20 mg total) by mouth 2 (two) times daily. 180 tablet 3   tobramycin-dexamethasone (TOBRADEX) ophthalmic solution Place 1 drop into both eyes 4 (four) times daily. For one week     tretinoin (RETIN-A) 0.025 % cream Apply 1 Application topically every other day. At night.     Vitamin D, Ergocalciferol, (DRISDOL) 1.25 MG (50000 UNIT) CAPS capsule Take 1 capsule (50,000 Units total) by mouth every 7 (seven) days. (Patient taking differently: Take 50,000 Units by mouth every Sunday.) 12 capsule 3   No current facility-administered medications for this visit.   No Known Allergies   Review of Systems: All systems reviewed and  negative except where noted in HPI.   Lab Results  Component Value Date   WBC 4.2 01/19/2023   HGB 12.6 01/19/2023   HCT 38.4 01/19/2023   MCV 91.9 01/19/2023  PLT 268 01/19/2023    Lab Results  Component Value Date   NA 138 10/06/2021   CL 106 10/06/2021   K 4.4 10/06/2021   CO2 24 10/06/2021   BUN 18 10/06/2021   CREATININE 0.80 10/06/2021   GFRNONAA >60 10/06/2021   CALCIUM 9.5 10/06/2021   ALBUMIN 4.1 11/08/2021   GLUCOSE 94 10/06/2021    Lab Results  Component Value Date   ALT 8 11/08/2021   AST 12 11/08/2021   ALKPHOS 59 11/08/2021   BILITOT 0.4 11/08/2021     Physical Exam: BP 110/70   Pulse 78   Ht 5\' 4"  (1.626 m)   Wt 171 lb (77.6 kg)   LMP 03/21/2020   BMI 29.35 kg/m  Constitutional: Pleasant,well-developed, female in no acute distress. Neurological: Alert and oriented to person place and time. Psychiatric: Normal mood and affect. Behavior is normal.   ASSESSMENT: 52 y.o. female here for assessment of the following  1. Chronic constipation   2. Bloating   3. Abdominal pain, epigastric   4. NSAID long-term use    Worsening of chronic constipation over the past year, having a bowel movement once every 2 weeks if not longer associate with bloating and abdominal discomfort.  Her colonoscopy is up-to-date and reassuring.  We discussed options.  She is not really tried high dose MiraLAX and will try that first to see if this would be enough to help her move her bowels as this is safe, effective and one of the cheaper options available.  She will try taking a double dose daily and increase to double dose twice daily if needed.  If she feels like she needs to purge her bowel at times, she can use MiraLAX prep as needed and discussed with her how to do that.  Alternatively we discussed other medical therapies to treat her constipation.  I provided her some samples of Linzess today dosed at 145 mcg/day as well as 290 mcg/day.  If she would prefer to use a  regimen like this or it works better for her, we can give her a prescription for it.  I also provided her some IBgard to use as needed for abdominal cramps.  If no better in the upcoming weeks she can contact me and we can discuss other options.  Otherwise reviewed her history of NSAID use, she is using significant NSAIDs on a routine basis, at risk for GI bleeding, amongst other side effects from NSAIDs to include kidney disease, increased risk for cardiovascular events etc.  Recommend she minimize NSAID use long-term.  If she absolutely needs to take them every day for pain control, recommend PPI prophylaxis.  I suspect she has gastritis or PUD causing her epigastric pain given her symptoms.  Recommend starting omeprazole 20 mg once daily and recommend she take it daily if she is using NSAIDs.  Discussed long-term risks of PPI use and she understands these but I think benefits of PPI prophylaxis outweigh risks ifs she is using high-dose NSAIDs.  She agrees with the plan.    PLAN: - Miralax  bowel prep to purge bowel PRN - at baseline would try Miralax BID and titrate up to double dose BID if needed - alternatively samples of Linzess given and , she will let me know if she wants a prescription pending her response - IB gard samples to use PRN for cramps - contact me in a few weeks with update if no better - minimize NSAID use - discussed risks -  start on omeprazole 20mg  / day, use if taking high dose NSAIDs / daily  Harlin Rain, MD [facility]

## 2023-01-22 NOTE — Patient Instructions (Signed)
BOWEL PURGE:   Purchase 1 (one) 119 GRAM bottle of Miralax Purchase 1 (one) 32 ounce bottle of Gatorade   STEPS:   Mix the entire bottle of Miralax in the 32 ounces of room temperature Gatorade and stir to dissolve completely. Drink the Miralax solution you have prepared. You will drink this mixture over the next 2-3 hours.  You should expect results within 1 to 6 hours after completing this bowel purge.  ______________________________________________________________________________  Please purchase the following medications over the counter and take as directed: Miralax: Take twice daily and titrate as needed.  Can take up to a double dose twice daily.  If this is not effective you can try Linzess 145 mcg: take once daily 30 minutes before a meal.  We are giving you samples today.  If still not effective, try Linzess 290 mcg once daily, 30 minutes before a meal.  We have given you samples of the following medication to take: IBgard: Take as directed as needed  Minimize NSAID use  We have sent the following medications to your pharmacy for you to pick up at your convenience: Omeprazole 20 mg: Take once daily  Please give [place] an update in a couple of weeks via MyChart.  Thank you for entrusting me with your care and for choosing [facility], Dr. Ileene Patrick   If your blood pressure at your visit was 140/90 or greater, please contact your primary care physician to follow up on this. ______________________________________________________  If you are age 42 or older, your body mass index should be between 23-30. Your Body mass index is 29.35 kg/m. If this is out of the aforementioned range listed, please consider follow up with your Primary Care Provider.  If you are age 71 or younger, your body mass index should be between 19-25. Your Body mass index is 29.35 kg/m. If this is out of the aformentioned range listed, please consider follow up with your Primary Care  Provider.  ________________________________________________________  The [place] [facility] providers would like to encourage you to use [facility] to communicate with providers for non-urgent requests or questions.  Due to long hold times on the telephone, sending your provider a message by [facility] may be a faster and more efficient way to get a response.  Please allow 48 business hours for a response.  Please remember that this is for non-urgent requests.  _______________________________________________________  Due to recent changes in healthcare laws, you may see the results of your imaging and laboratory studies on MyChart before your provider has had a chance to review them.  We understand that in some cases there may be results that are confusing or concerning to you. Not all laboratory results come back in the same time frame and the provider may be waiting for multiple results in order to interpret others.  Please give [place] 48 hours in order for your provider to thoroughly review all the results before contacting the office for clarification of your results.

## 2023-01-29 ENCOUNTER — Other Ambulatory Visit: Payer: Self-pay

## 2023-01-29 ENCOUNTER — Encounter: Payer: Self-pay | Admitting: Hematology and Oncology

## 2023-01-29 ENCOUNTER — Other Ambulatory Visit (HOSPITAL_COMMUNITY): Payer: Self-pay

## 2023-01-29 ENCOUNTER — Telehealth: Payer: Self-pay | Admitting: Pharmacy Technician

## 2023-01-29 MED ORDER — LINACLOTIDE 145 MCG PO CAPS: 145.0000 ug | ORAL_CAPSULE | Freq: Every day | ORAL | 2 refills | Status: AC

## 2023-01-29 NOTE — Telephone Encounter (Signed)
Pharmacy Patient Advocate Encounter   Received notification from CoverMyMeds that prior authorization for [facility] is required/requested.   Insurance verification completed.   The patient is insured through [employer] .   Per test claim: PA required; PA submitted to above mentioned insurance via CoverMyMeds Key/confirmation #/EOC [id number] Status is pending

## 2023-02-06 ENCOUNTER — Other Ambulatory Visit: Payer: Self-pay | Admitting: Family Medicine

## 2023-02-06 DIAGNOSIS — Z1231 Encounter for screening mammogram for malignant neoplasm of breast: Secondary | ICD-10-CM

## 2023-02-15 ENCOUNTER — Encounter: Payer: Self-pay | Admitting: Hematology and Oncology

## 2023-02-15 ENCOUNTER — Other Ambulatory Visit (HOSPITAL_COMMUNITY): Payer: Self-pay

## 2023-02-15 NOTE — Telephone Encounter (Signed)
Pharmacy Patient Advocate Encounter  Received notification from [facility] that Prior Authorization for [facility]  has been APPROVED from 01/29/23 to 01/29/24. Unable to obtain price due to refill too soon rejection, last fill date 01/30/23 next available fill date12/19/24   PA #/Case ID/Reference #: [id number]

## 2023-02-22 ENCOUNTER — Ambulatory Visit
Admission: RE | Admit: 2023-02-22 | Discharge: 2023-02-22 | Disposition: A | Discharge status: Home / Self Care | Payer: PRIVATE HEALTH INSURANCE | Source: Ambulatory Visit | Attending: Family Medicine | Admitting: Family Medicine

## 2023-02-22 DIAGNOSIS — Z1231 Encounter for screening mammogram for malignant neoplasm of breast: Secondary | ICD-10-CM

## 2023-03-19 ENCOUNTER — Encounter: Payer: PRIVATE HEALTH INSURANCE | Admitting: Psychology

## 2023-03-21 ENCOUNTER — Encounter: Payer: Self-pay | Admitting: Family Medicine

## 2023-03-21 ENCOUNTER — Other Ambulatory Visit: Payer: Self-pay | Admitting: Family Medicine

## 2023-03-22 NOTE — Telephone Encounter (Signed)
LOV was 12/13/22

## 2023-03-23 MED ORDER — SCOPOLAMINE 1 MG/3DAYS TD PT72: 1.0000 | MEDICATED_PATCH | TRANSDERMAL | 5 refills | Status: AC

## 2023-03-23 MED ORDER — ONDANSETRON HCL 4 MG PO TABS: 4.0000 mg | ORAL_TABLET | ORAL | 2 refills | Status: AC | PRN

## 2023-03-23 NOTE — Telephone Encounter (Signed)
I have already refilled the Lorazepam. I also just sent in for the scopolamine patches and more Zofran

## 2023-07-03 LAB — LAB REPORT - SCANNED
A1c: 5.7
EGFR: 97
TSH: 1.79 (ref 0.41–5.90)

## 2023-08-17 ENCOUNTER — Ambulatory Visit: Payer: Self-pay

## 2023-08-17 NOTE — Telephone Encounter (Signed)
 FYI Only or Action Required?: FYI only for provider  Patient was last seen in primary care on 12/13/2022 by Donley Furth, MD. Called Nurse Triage reporting Edema and Hip Pain. Symptoms began several weeks ago. Interventions attempted: OTC medications: Tylenol . Symptoms are: unchanged.  Triage Disposition: See PCP When Office is Open (Within 3 Days)  Patient/caregiver understands and will follow disposition?:               Copied from CRM [id number]. Topic: Clinical - Red Word Triage >> Aug 17, 2023  3:00 PM Kita Perish H wrote: [facility] that prompted transfer to Nurse Triage: Inflammation in hip, thigh and hands, wrist, fingers, toes and ankles, increasing weight. Reason for Disposition  [1] MODERATE pain (e.g., interferes with normal activities, limping) AND [2] present > 3 days  Answer Assessment - Initial Assessment Questions Pt has had generalized joint pain, worse in her hip, for 3 wks with weight gain. Pt has not changed her diet. Pt has a gynecology appt Monday and is unable to come into the office Monday. RN scheduled pt for Tuesday. RN advised pt to go to the ED for worsening and pt verbalized understanding.   1. LOCATION and RADIATION: Where is the pain located?      L hip, wrist, hands, fingers, toes, ankles; joint pain all over  3. SEVERITY: How bad is the pain? What does it keep you from doing?   (Scale 1-10; or mild, moderate, severe)   -  MILD (1-3): doesn't interfere with normal activities    -  MODERATE (4-7): interferes with normal activities (e.g., work or school) or awakens from sleep, limping    -  SEVERE (8-10): excruciating pain, unable to do any normal activities, unable to walk     When I am having a bad episode it's a 7-8/10, wrist and finger pain as well  4. ONSET: When did the pain start? Does it come and go, or is it there all the time?     3 wks 5. WORK OR EXERCISE: Has there been any recent work or exercise that involved this part of the  body?      Denies  6. CAUSE: What do you think is causing the hip pain?      Not sure  7. AGGRAVATING FACTORS: What makes the hip pain worse?  I can't do stairs like I used to, I have to lead with my R leg; sometimes it's way worse than others  Denies joint swelling despite having joint pain. Wrists and fingers are tender on palpation. Denies redness   Bilateral ankle swelling, feet get a little swollen  Palpitations have kicked up -- I was doing pretty good, propranolol  was helping, now in the last 3 months I have those episodes every day; heart races, Apple Watch will say 160-170 and then come back down  Going through menopause, low on estrogen   Gaining weight despite eating a low-carb diet  Difficulty walking, not exercising as much; denies difficulty breathing, denies CP  Denies fever, denies worsening H/A, neck pain d/t recent surgery  Protocols used: Hip Pain-A-AH

## 2023-08-20 NOTE — Telephone Encounter (Signed)
 Noted

## 2023-08-21 ENCOUNTER — Ambulatory Visit (INDEPENDENT_AMBULATORY_CARE_PROVIDER_SITE_OTHER): Payer: PRIVATE HEALTH INSURANCE | Admitting: Family Medicine

## 2023-08-21 ENCOUNTER — Encounter: Payer: Self-pay | Admitting: Family Medicine

## 2023-08-21 ENCOUNTER — Ambulatory Visit: Payer: PRIVATE HEALTH INSURANCE

## 2023-08-21 VITALS — BP 118/80 | Temp 98.3°F | Wt 181.0 lb

## 2023-08-21 DIAGNOSIS — M25552 Pain in left hip: Secondary | ICD-10-CM | POA: Diagnosis not present

## 2023-08-21 DIAGNOSIS — E039 Hypothyroidism, unspecified: Secondary | ICD-10-CM | POA: Diagnosis not present

## 2023-08-21 DIAGNOSIS — G8929 Other chronic pain: Secondary | ICD-10-CM | POA: Diagnosis not present

## 2023-08-21 MED ORDER — IBUPROFEN 800 MG PO TABS: 800.0000 mg | ORAL_TABLET | Freq: Three times a day (TID) | ORAL | 5 refills | Status: AC | PRN

## 2023-08-21 MED ORDER — THYROID 60 MG PO TABS: 60.0000 mg | ORAL_TABLET | Freq: Every day | ORAL | 3 refills | Status: AC

## 2023-08-21 NOTE — Progress Notes (Signed)
   Subjective:    Patient ID: [name], female    DOB: 1970/11/10, 53 y.o.   MRN: [id number]  HPI Here for 4 months of stiffness and pain in the left hip. No back pain. The pain radiates to the middle thigh. Ibuprofen  800 mg helps this for awhile. No recent trauma. Also she asks me to review her recent lab results from her GYN, Dr. Duke Gibbons. Her TSH was normal at 1.790 and free T4 was normal at 5.8. however the free T3 was low at 70.    Review of Systems  Constitutional: Negative.   Respiratory: Negative.    Cardiovascular: Negative.   Musculoskeletal:  Positive for arthralgias.       Objective:   Physical Exam Constitutional:      Appearance: Normal appearance.   Cardiovascular:     Rate and Rhythm: Normal rate and regular rhythm.     Pulses: Normal pulses.     Heart sounds: Normal heart sounds.  Pulmonary:     Effort: Pulmonary effort is normal.     Breath sounds: Normal breath sounds.   Musculoskeletal:     Comments: Left hip is not tender. Her ROM is very limited due to pain.    Neurological:     Mental Status: She is alert.           Assessment & Plan:  Left hip pain. We will get Xrays of both hips today. For the hypothyroidism, she will start on [place] thyroid  60 mg daily. Recheck in 2 months. Corita Diego, MD

## 2023-08-30 ENCOUNTER — Ambulatory Visit: Payer: Self-pay | Admitting: Family Medicine

## 2023-09-10 ENCOUNTER — Encounter: Payer: Self-pay | Admitting: Hematology and Oncology

## 2023-09-12 ENCOUNTER — Encounter: Payer: Self-pay | Admitting: Family Medicine

## 2023-09-12 ENCOUNTER — Ambulatory Visit (INDEPENDENT_AMBULATORY_CARE_PROVIDER_SITE_OTHER): Payer: PRIVATE HEALTH INSURANCE | Admitting: Family Medicine

## 2023-09-12 ENCOUNTER — Encounter: Payer: Self-pay | Admitting: Hematology and Oncology

## 2023-09-12 VITALS — BP 116/80 | HR 85 | Temp 98.2°F | Ht 64.0 in | Wt 175.0 lb

## 2023-09-12 DIAGNOSIS — R531 Weakness: Secondary | ICD-10-CM | POA: Diagnosis not present

## 2023-09-12 DIAGNOSIS — R6889 Other general symptoms and signs: Secondary | ICD-10-CM | POA: Diagnosis not present

## 2023-09-12 MED ORDER — TOPIRAMATE 50 MG PO TABS: 50.0000 mg | ORAL_TABLET | Freq: Every day | ORAL | 2 refills | Status: DC

## 2023-09-12 NOTE — Progress Notes (Unsigned)
   Subjective:    Patient ID: [name], female    DOB: 05/17/1970, 52 y.o.   MRN: [id number]  HPI Here for 4 episodes she has had since 08-31-23 where experiences alterations of consciousness. All of these have occurred in the evenings while she was having dinner, and all of them have been witnessed by her husband. She says she will be feeling fine and then suddenly feels like she is outside her body. She feels like her arms and legs become very heavy, she feels dizzy, and she has trouble thinking straight. Her husband says her eyes become glassy, and she is slow to respond when he speaks to her. Her speech becomes very slowed but not slurred. There is no clenching or shaking. She denies any headache or visual changes. This lasts 3-4 hours and then goes away. She then feels very tired for the next 24 hours. Her husband checked her glucose once during one of these spells, and this was 60. Her BP and blood oxygen levels are normal. She has had hypoglycemic spells in the past, and she says the current spells feel very different. She does not feel shaky, and eating food does not relieve these spells. No loss of bladder of bowel control. She does have a history of migraines which involve severe throbbing headaches, and these are often accompanied by visual auras. These were more frequent several years ago. She notes her mother has seizures. Today she feels fine.    Review of Systems  Constitutional:  Positive for fatigue.  Respiratory: Negative.    Cardiovascular: Negative.   Gastrointestinal: Negative.   Genitourinary: Negative.   Neurological:  Positive for dizziness, speech difficulty and weakness. Negative for tremors, seizures, syncope, facial asymmetry, numbness and headaches.       Objective:   Physical Exam Constitutional:      Appearance: Normal appearance.  Cardiovascular:     Rate and Rhythm: Normal rate and regular rhythm.     Pulses: Normal pulses.     Heart sounds: Normal  heart sounds.  Pulmonary:     Effort: Pulmonary effort is normal.     Breath sounds: Normal breath sounds.  Musculoskeletal:     Right lower leg: No edema.     Left lower leg: No edema.  Neurological:     General: No focal deficit present.     Mental Status: She is alert and oriented to person, place, and time.           Assessment & Plan:  She has had 4 spells of altered consciousness and generalized weakness. Hypoglycemia is a consideration, but I think that is quite unlikely. Other possible etiologies that are more likely include seizures or atypical migraines. We agreed that atypical migraines are possible, so she will begin taking Topiramate  50 mg at bedtime. We will also refer her to Neurology to evaluate. We spent a total of (35   ) minutes reviewing records and discussing these issues.  Garnette Olmsted, MD

## 2023-09-24 ENCOUNTER — Other Ambulatory Visit: Payer: Self-pay | Admitting: Family Medicine

## 2023-10-15 ENCOUNTER — Ambulatory Visit: Payer: PRIVATE HEALTH INSURANCE | Admitting: Psychology

## 2023-10-15 ENCOUNTER — Encounter: Payer: PRIVATE HEALTH INSURANCE | Admitting: Psychology

## 2023-10-16 ENCOUNTER — Ambulatory Visit: Payer: PRIVATE HEALTH INSURANCE | Admitting: Neurology

## 2023-10-16 ENCOUNTER — Encounter: Payer: Self-pay | Admitting: Neurology

## 2023-10-16 VITALS — BP 130/74 | HR 68 | Ht 64.0 in | Wt 179.0 lb

## 2023-10-16 DIAGNOSIS — M7989 Other specified soft tissue disorders: Secondary | ICD-10-CM

## 2023-10-16 DIAGNOSIS — M2550 Pain in unspecified joint: Secondary | ICD-10-CM

## 2023-10-16 DIAGNOSIS — E559 Vitamin D deficiency, unspecified: Secondary | ICD-10-CM | POA: Diagnosis not present

## 2023-10-16 DIAGNOSIS — M6281 Muscle weakness (generalized): Secondary | ICD-10-CM

## 2023-10-16 DIAGNOSIS — E538 Deficiency of other specified B group vitamins: Secondary | ICD-10-CM

## 2023-10-16 DIAGNOSIS — R419 Unspecified symptoms and signs involving cognitive functions and awareness: Secondary | ICD-10-CM

## 2023-10-16 DIAGNOSIS — R4189 Other symptoms and signs involving cognitive functions and awareness: Secondary | ICD-10-CM

## 2023-10-16 DIAGNOSIS — R4701 Aphasia: Secondary | ICD-10-CM

## 2023-10-16 DIAGNOSIS — R569 Unspecified convulsions: Secondary | ICD-10-CM | POA: Diagnosis not present

## 2023-10-16 DIAGNOSIS — R41840 Attention and concentration deficit: Secondary | ICD-10-CM
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
## 2023-10-16 NOTE — Patient Instructions (Addendum)
 MRi of the brain w/wo contrast (seizure protocol) EEG routine with reflex to ambulatory 3-day eeg  Record episodes on video and attach in mychart Blood work  (check muscles enzymes, check b12, check Vit D, magnesium, cbc/cmp, tsh with reflex, ANA - see below) Schedule routine eeg and will see what happens with the workup  Orders Placed This Encounter  Procedures   MR BRAIN W WO CONTRAST   B12 and Folate Panel   Methylmalonic acid, serum   Vitamin D , 25-hydroxy   CK   Magnesium   CBC with Differential/Platelets   Comprehensive metabolic panel with GFR   Thyroid  Panel With TSH   Sedimentation rate   ANA, IFA (with reflex)   EEG adult     Discussed:  There is increased risk for stroke in women with migraine with aura and a contraindication for the combined contraceptive pill for use by women who have migraine with aura. The risk for women with migraine without aura is lower. However other risk factors like smoking are far more likely to increase stroke risk than migraine. There is a recommendation for no smoking and for the use of OCPs without estrogen such as progestogen only pills particularly for women with migraine with aura.SABRA People who have migraine headaches with auras may be 3 times more likely to have a stroke caused by a blood clot, compared to migraine patients who don't see auras. Women who take hormone-replacement therapy may be 30 percent more likely to suffer a clot-based stroke than women not taking medication containing estrogen. Other risk factors like smoking and high blood pressure may be  much more important. And stroke is still a rare complication due to migraine aura and is controversial and lower doses may not cause a risk.

## 2023-10-17 ENCOUNTER — Other Ambulatory Visit: Payer: Self-pay | Admitting: Family Medicine

## 2023-10-18 ENCOUNTER — Ambulatory Visit (INDEPENDENT_AMBULATORY_CARE_PROVIDER_SITE_OTHER): Payer: PRIVATE HEALTH INSURANCE | Admitting: Neurology

## 2023-10-18 ENCOUNTER — Encounter: Payer: Self-pay | Admitting: Family Medicine

## 2023-10-18 DIAGNOSIS — R569 Unspecified convulsions: Secondary | ICD-10-CM

## 2023-10-18 NOTE — Telephone Encounter (Signed)
-----   Message from Onetha KATHEE Epp sent at 10/18/2023  4:51 PM EDT ----- EEG is normal, call and ask if I can order a 3-day ambulatory EEG  thanks ----- Message ----- From: Gregg Lek, MD Sent: 10/18/2023  10:20 AM EDT To: Onetha KATHEE Epp, MD

## 2023-10-18 NOTE — Procedures (Signed)
    History:  53 year old man with seizure like activity   EEG classification: Awake and drowsy  Duration: 28 minutes   Technical aspects: This EEG study was done with scalp electrodes positioned according to the 10-20 International system of electrode placement. Electrical activity was reviewed with band pass filter of 1-70Hz , sensitivity of 7 uV/mm, display speed of 65mm/sec with a 60Hz  notched filter applied as appropriate. EEG data were recorded continuously and digitally stored.   Description of the recording: The background rhythms of this recording consists of a fairly well modulated medium amplitude alpha rhythm of 10 Hz that is reactive to eye opening and closure. Present in the anterior head region is a 15-20 Hz beta activity. Photic stimulation was performed, did not show any abnormalities. Hyperventilation was also performed, did not show any abnormalities. Drowsiness was manifested by background fragmentation. No abnormal epileptiform discharges seen during this recording. There was no focal slowing. There were no electrographic seizure identified.   Abnormality: None   Impression: This is a normal awake and drowsy EEG. No evidence of interictal epileptiform discharges. Normal EEGs, however, do not rule out epilepsy.    Brenyn Petrey, MD [facility]

## 2023-10-19 NOTE — Telephone Encounter (Signed)
 Set up another OV for us  to discuss this

## 2023-10-22 NOTE — Telephone Encounter (Signed)
 Spoke to patient gave EEG results Pt agreed to 3 day EEG Made patient aware will take 7-14 business days for insurance approval . Pt thanked me for calling order complete placed in providers box to be signed

## 2023-10-23 NOTE — Telephone Encounter (Signed)
 72 hr EEG order faxed to [facility]. Received a receipt of confirmation.

## 2023-10-26 LAB — COMPREHENSIVE METABOLIC PANEL WITH GFR
ALT: 21 IU/L (ref 0–32)
AST: 17 IU/L (ref 0–40)
Albumin: 4.3 g/dL (ref 3.8–4.9)
Alkaline Phosphatase: 63 IU/L (ref 44–121)
BUN/Creatinine Ratio: 15 (ref 9–23)
BUN: 10 mg/dL (ref 6–24)
Bilirubin Total: 0.3 mg/dL (ref 0.0–1.2)
CO2: 21 mmol/L (ref 20–29)
Calcium: 9.7 mg/dL (ref 8.7–10.2)
Chloride: 101 mmol/L (ref 96–106)
Creatinine, Ser: 0.66 mg/dL (ref 0.57–1.00)
Globulin, Total: 2.5 g/dL (ref 1.5–4.5)
Glucose: 70 mg/dL (ref 70–99)
Potassium: 4.3 mmol/L (ref 3.5–5.2)
Sodium: 138 mmol/L (ref 134–144)
Total Protein: 6.8 g/dL (ref 6.0–8.5)
eGFR: 106 mL/min/1.73 (ref >59)

## 2023-10-26 LAB — CBC WITH DIFFERENTIAL/PLATELET
Basophils Absolute: 0.1 x10E3/uL (ref 0.0–0.2)
Basos: 1 %
EOS (ABSOLUTE): 0.1 x10E3/uL (ref 0.0–0.4)
Eos: 1 %
Hematocrit: 46.2 % (ref 34.0–46.6)
Hemoglobin: 14.8 g/dL (ref 11.1–15.9)
Immature Grans (Abs): 0 x10E3/uL (ref 0.0–0.1)
Immature Granulocytes: 0 %
Lymphocytes Absolute: 1.3 x10E3/uL (ref 0.7–3.1)
Lymphs: 24 %
MCH: 30.4 pg (ref 26.6–33.0)
MCHC: 32 g/dL (ref 31.5–35.7)
MCV: 95 fL (ref 79–97)
Monocytes Absolute: 0.4 x10E3/uL (ref 0.1–0.9)
Monocytes: 7 %
Neutrophils Absolute: 3.7 x10E3/uL (ref 1.4–7.0)
Neutrophils: 67 %
Platelets: 297 x10E3/uL (ref 150–450)
RBC: 4.87 x10E6/uL (ref 3.77–5.28)
RDW: 12.2 % (ref 11.7–15.4)
WBC: 5.4 x10E3/uL (ref 3.4–10.8)

## 2023-10-26 LAB — THYROID PANEL WITH TSH
Free Thyroxine Index: 1.3 (ref 1.2–4.9)
T3 Uptake Ratio: 23 % — ABNORMAL LOW (ref 24–39)
T4, Total: 5.8 ug/dL (ref 4.5–12.0)
TSH: 0.242 u[IU]/mL — ABNORMAL LOW (ref 0.450–4.500)

## 2023-10-26 LAB — VITAMIN D 25 HYDROXY (VIT D DEFICIENCY, FRACTURES): Vit D, 25-Hydroxy: 28.7 ng/mL — ABNORMAL LOW (ref 30.0–100.0)

## 2023-10-26 LAB — CK: Total CK: 71 U/L (ref 32–182)

## 2023-10-26 LAB — VITAMIN B1: Thiamine: 80.7 nmol/L (ref 66.5–200.0)

## 2023-10-26 LAB — ANTINUCLEAR ANTIBODIES, IFA: ANA Titer 1: NEGATIVE

## 2023-10-26 LAB — METHYLMALONIC ACID, SERUM: Methylmalonic Acid: 107 nmol/L (ref 0–378)

## 2023-10-26 LAB — B12 AND FOLATE PANEL
Folate: >20 ng/mL (ref >3.0)
Vitamin B-12: 492 pg/mL (ref 232–1245)

## 2023-10-26 LAB — SEDIMENTATION RATE: Sed Rate: 4 mm/h (ref 0–40)

## 2023-10-26 LAB — MAGNESIUM: Magnesium: 1.9 mg/dL (ref 1.6–2.3)

## 2023-10-29 ENCOUNTER — Ambulatory Visit: Payer: PRIVATE HEALTH INSURANCE | Admitting: Family Medicine

## 2023-10-29 ENCOUNTER — Encounter: Payer: Self-pay | Admitting: Family Medicine

## 2023-10-29 VITALS — BP 124/70 | Temp 98.4°F | Wt 178.0 lb

## 2023-10-29 DIAGNOSIS — E039 Hypothyroidism, unspecified: Secondary | ICD-10-CM | POA: Diagnosis not present

## 2023-10-29 DIAGNOSIS — N951 Menopausal and female climacteric states: Secondary | ICD-10-CM | POA: Diagnosis not present

## 2023-10-29 NOTE — Progress Notes (Signed)
   Subjective:    Patient ID: [name] Lab, female    DOB: Aug 03, 1970, 53 y.o.   MRN: [id number]  HPI Here asking for advice about a number of issues. First she has been seeing Dr. Norine about menopausal symptoms, and a recent saliva test showed she has low levels of estrogen, progesterone, and testosterone. Dr. Norine has started her on progesterone pills and testosterone patches, but she has been very hesitant to use any estrogen products on [name] because of her history of blood clots 25 years ago. This was around the time she was hospitalized for sepsis after a breast reduction surgery. Dr. Norine asked [name] to get our opinion on this. [name] has also been seeing Dr. Ines about her altered consciousness episodes, and Dr. Ines though she she never use estrogen products because patients with aural migraines have a higher risk of strokes. In addition [name] says he thyroid  levels make no sense. We have been treating her woith [facility] thyroid  for a low freeT3, but recent testing at Dr. Maria office showed the free T3 to be even lower than before.    Review of Systems  Constitutional: Negative.   Respiratory: Negative.    Cardiovascular: Negative.        Objective:   Physical Exam Constitutional:      Appearance: Normal appearance.  Cardiovascular:     Rate and Rhythm: Normal rate and regular rhythm.     Pulses: Normal pulses.     Heart sounds: Normal heart sounds.  Pulmonary:     Effort: Pulmonary effort is normal.     Breath sounds: Normal breath sounds.  Neurological:     Mental Status: She is alert and oriented to person, place, and time. Mental status is at baseline.           Assessment & Plan:  She is dealing with a number of issues, and it is not clear if these are related or not. We will refer her to Endocrinology for the hypothyroidism ,and they can possibly shed more light on the hormone situation. Garnette Olmsted, MD

## 2023-11-01 NOTE — Telephone Encounter (Signed)
 Received this message from [place] with [facility]:  Good Morning Dr. Ines and Heather,  The Patient's Insurance requires updated clinicals - please see below.  No Good REEG 8/25Normal alterations of consciousness, all occurring in the evenings witnessed by her husband, she will feel fine and then suddenly feels like she is outside her body. Her arms and legs get very heavy. Her speech becomes very slowed, last 3 to 4 hours and then go away.no loss of bladder or bowel control.  Per clinicals it states Doesn't sound like seizures (too long, bilateral weakness without loss of awarenes not c/w seizures Billing Code: G40.89, Physician Code: R56.9 [id number]  Thank you and please let me know if you have any questions.

## 2023-11-07 NOTE — Progress Notes (Signed)
 Thanks I have relayed this to [facility] and faxed the updated note to [employer]. Received a receipt of confirmation.

## 2023-11-08 ENCOUNTER — Encounter: Payer: Self-pay | Admitting: Family Medicine

## 2023-11-14 ENCOUNTER — Ambulatory Visit
Admission: RE | Admit: 2023-11-14 | Discharge: 2023-11-14 | Disposition: A | Discharge status: Home / Self Care | Payer: PRIVATE HEALTH INSURANCE | Source: Ambulatory Visit | Attending: Neurology | Admitting: Neurology

## 2023-11-14 DIAGNOSIS — R4189 Other symptoms and signs involving cognitive functions and awareness: Secondary | ICD-10-CM

## 2023-11-14 DIAGNOSIS — R4701 Aphasia: Secondary | ICD-10-CM

## 2023-11-14 DIAGNOSIS — M6281 Muscle weakness (generalized): Secondary | ICD-10-CM

## 2023-11-14 DIAGNOSIS — R419 Unspecified symptoms and signs involving cognitive functions and awareness: Secondary | ICD-10-CM

## 2023-11-14 DIAGNOSIS — R41840 Attention and concentration deficit: Secondary | ICD-10-CM

## 2023-11-14 DIAGNOSIS — R569 Unspecified convulsions: Secondary | ICD-10-CM | POA: Diagnosis not present

## 2023-11-14 MED ORDER — GADOPICLENOL 0.5 MMOL/ML IV SOLN
8.0000 mL | Freq: Once | INTRAVENOUS | Status: AC | PRN
  Administered 2023-11-14: 8 mL via INTRAVENOUS

## 2023-11-20 ENCOUNTER — Encounter: Payer: Self-pay | Admitting: Neurology

## 2023-11-21 NOTE — Telephone Encounter (Signed)
 Heron w/ [facility] states they don't have the updated note. I have faxed this again to [facility] at [phone]. Received a receipt of confirmation.

## 2023-11-22 ENCOUNTER — Encounter: Payer: Self-pay | Admitting: Family Medicine

## 2023-11-22 DIAGNOSIS — E039 Hypothyroidism, unspecified: Secondary | ICD-10-CM

## 2023-11-26 NOTE — Telephone Encounter (Signed)
 I understand her frustration. Hopefully Dr. Ines will have some good ideas

## 2023-12-05 NOTE — Telephone Encounter (Signed)
 A lot of her symptoms are consistent with low thyroid  levels. I want to recheck these levels, so I put in orders for labs. Have her make a lab appt for these

## 2023-12-11 NOTE — Telephone Encounter (Signed)
 Pt is currently being reassigned to Dr Onita and message was sent to MD regarding review of chart and follow-up.

## 2023-12-17 ENCOUNTER — Other Ambulatory Visit (INDEPENDENT_AMBULATORY_CARE_PROVIDER_SITE_OTHER): Payer: PRIVATE HEALTH INSURANCE

## 2023-12-17 DIAGNOSIS — E039 Hypothyroidism, unspecified: Secondary | ICD-10-CM

## 2023-12-18 ENCOUNTER — Ambulatory Visit: Payer: Self-pay | Admitting: Family Medicine

## 2023-12-18 LAB — TSH: TSH: 0.38 u[IU]/mL (ref 0.35–5.50)

## 2023-12-18 LAB — T3, FREE: T3, Free: 3.2 pg/mL (ref 2.3–4.2)

## 2023-12-18 LAB — T4, FREE: Free T4: 0.8 ng/dL (ref 0.60–1.60)

## 2023-12-19 ENCOUNTER — Ambulatory Visit: Payer: PRIVATE HEALTH INSURANCE | Admitting: Family Medicine

## 2023-12-19 ENCOUNTER — Encounter: Payer: Self-pay | Admitting: Family Medicine

## 2023-12-19 VITALS — BP 130/80 | HR 73 | Temp 98.2°F | Wt 183.0 lb

## 2023-12-19 DIAGNOSIS — R5383 Other fatigue: Secondary | ICD-10-CM

## 2023-12-19 DIAGNOSIS — E559 Vitamin D deficiency, unspecified: Secondary | ICD-10-CM

## 2023-12-19 DIAGNOSIS — E538 Deficiency of other specified B group vitamins: Secondary | ICD-10-CM

## 2023-12-19 DIAGNOSIS — R635 Abnormal weight gain: Secondary | ICD-10-CM

## 2023-12-19 DIAGNOSIS — E039 Hypothyroidism, unspecified: Secondary | ICD-10-CM

## 2023-12-19 NOTE — Progress Notes (Signed)
   Subjective:    Patient ID: [name] Lab, female    DOB: [birth year], 53 y.o.   MRN: [id number]  HPI Here to follow up on a myriad if symptoms that suddenly started 2 months ago. She has gained over 20 lbs of weight during this time, though her diet and exercise routines have not changed. She has trouble concentrating and keeping her thoughts straight. She had the sudden onset of swelling in both lower legs a few weeks ago, then this suddenly resolved 2 weeks later. We checked thyroid  levels several weeks ago, and they were normal.    Review of Systems  Constitutional:  Positive for fatigue and unexpected weight change.  Respiratory: Negative.    Cardiovascular:  Positive for leg swelling. Negative for chest pain and palpitations.  Gastrointestinal: Negative.   Genitourinary: Negative.   Neurological: Negative.   Psychiatric/Behavioral:  Positive for decreased concentration. Negative for confusion.        Objective:   Physical Exam Constitutional:      General: She is not in acute distress.    Appearance: Normal appearance.  Cardiovascular:     Rate and Rhythm: Normal rate and regular rhythm.     Pulses: Normal pulses.     Heart sounds: Normal heart sounds.  Pulmonary:     Effort: Pulmonary effort is normal.     Breath sounds: Normal breath sounds.  Musculoskeletal:     Right lower leg: No edema.     Left lower leg: No edema.  Neurological:     Mental Status: She is alert and oriented to person, place, and time. Mental status is at baseline.  Psychiatric:        Thought Content: Thought content normal.        Judgment: Judgment normal.           Assessment & Plan:  She had the sudden onset of symtoms including weight gain, decreased concentration, and leg swelling. We will check levels of vitamins D and B12 today. We referred her to Endocrinology for this, and she is scheduled to see Dr. Iraq Thapa on 02-14-24. I personally spent a total of 35 minutes in the care  of the patient today including getting/reviewing separately obtained history, performing a medically appropriate exam/evaluation, counseling and educating, referring and communicating with other health care professionals, and coordinating care.  Garnette Olmsted, MD

## 2023-12-20 ENCOUNTER — Ambulatory Visit: Payer: Self-pay | Admitting: Family Medicine

## 2023-12-20 LAB — VITAMIN D 25 HYDROXY (VIT D DEFICIENCY, FRACTURES): VITD: 42.27 ng/mL (ref 30.00–100.00)

## 2023-12-20 LAB — VITAMIN B12: Vitamin B-12: 352 pg/mL (ref 211–911)

## 2023-12-20 NOTE — Telephone Encounter (Signed)
 Pt was seen by Dr Johnny for this on 12/19/23

## 2023-12-31 DIAGNOSIS — G4089 Other seizures: Secondary | ICD-10-CM | POA: Diagnosis not present

## 2023-12-31 DIAGNOSIS — R413 Other amnesia: Secondary | ICD-10-CM | POA: Diagnosis not present

## 2023-12-31 DIAGNOSIS — R42 Dizziness and giddiness: Secondary | ICD-10-CM

## 2024-01-01 ENCOUNTER — Encounter: Payer: Self-pay | Admitting: Family Medicine

## 2024-01-01 NOTE — Telephone Encounter (Signed)
 Spoke with patient and scheduled follow up with Dr Onita for 04/10/24 at [facility]

## 2024-01-01 NOTE — Telephone Encounter (Signed)
 It's up to her of course, but I would rather she wait to see the Endocrinologist than try an online program

## 2024-01-09 ENCOUNTER — Other Ambulatory Visit: Payer: Self-pay | Admitting: Neurology

## 2024-01-09 ENCOUNTER — Encounter (INDEPENDENT_AMBULATORY_CARE_PROVIDER_SITE_OTHER): Payer: PRIVATE HEALTH INSURANCE | Admitting: Neurology

## 2024-01-09 ENCOUNTER — Telehealth: Payer: Self-pay | Admitting: Neurology

## 2024-01-09 DIAGNOSIS — R569 Unspecified convulsions: Secondary | ICD-10-CM

## 2024-01-09 MED ORDER — LAMOTRIGINE ER 25 MG PO TB24: ORAL_TABLET | ORAL | 0 refills | Status: DC

## 2024-01-09 NOTE — Procedures (Signed)
 Patient Name: [name]  MRN: [id number]  Referring Physician/Provider: Ines  Study start date: 12/28/2023 at 0659 PM Study end date: 12/31/2023 at 0946 PM Duration: 75 hours    Clinical History:  This is a 53 y/o W presenting with unusual episodes of 3-4 hours of muscle weakness without alteration of awareness(no other focal abnormalities, no headache, no vision changes, no loss of consciousness or awareness, glucose and pulse and blood pressures normal during episode) however feels foggy, like her eyes are glassy and tired and since then she feels like her concentration is affected, doesn't feel right at work. But she feels weak now with some mild generalized weakness.   INTERMITTENT MONITORING with VIDEO TECHNICAL SUMMARY:  This AVEEG was performed using equipment provided by [employer] utilizing Bluetooth ( [employer] ) amplifiers with continuous EEGT attended video collection using encrypted remote transmission via [employer] secured cellular tower network with data rates for each AVEEG performed. This is a therapist, music AVEEG, obtained, according to the 10-20 international electrode placement system, reformatted digitally into referential and bipolar montages. Data was acquired with a minimum of 21 bipolar connections and sampled at a minimum rate of 250 cycles per second per channel, maximum rate of 450 cycles per second per channel and two channels for EKG. The entire VEEG study was recorded through cable and or radio telemetry for subsequent analysis. Specified epochs of the AVEEG data were identified at the direction of the subject by the depression of a push button by the patient. Each patients event file included data acquired two minutes prior to the push button activation and continuing until two minutes afterwards. AVEEG files were reviewed on [facility] server, Licensed Software provided by [employer] with a digital high frequency filter set at 70 Hz and  a low frequency filter set at 1 Hz with a paper speed of 75mm/s resulting in 10 seconds per digital page. This entire AVEEG was reviewed by the EEG Technologist. Random time samples, random sleep samples, clips, patient initiated push button files with included patient daily diary logs, EEG Technologist pruned data was reviewed and verified for accuracy and validity by the governing reading neurologist in full details. This AEEGV was fully compliant with all requirements for CPT 97500 for setup, patient education, take down and administered by an EEG technologist.   Long-Term EEG with Video was monitored intermittently by a qualified EEG technologist for the entirety of the recording; quality check-ins were performed at a minimum of every two hours, checking and documenting real-time data and video to assure the integrity and quality of the recording (e.g., camera position, electrode integrity and impedance), and identify the need for maintenance. For intermittent monitoring, an EEG Technologist monitored no more than 12 patients concurrently. Diagnostic video was captured at least 80% of the time during the recording.   PATIENT EVENTS:  There were no patient events noted or captured during this recording.   TECHNOLOGIST EVENTS:  There were occasional left temporal sharp waves seen during sleep by the reviewing neurodiagnostic technologist for further review.   TIME SAMPLES:  10-minutes of every 2 hours recorded are reviewed as random time samples.   SLEEP SAMPLES:  5-minutes of every 24 hours recorded are reviewed as random sleep samples.   AWAKE:  At maximal level of alertness, the posterior dominant background activity was continuous, reactive, low voltage rhythm of 10 Hz. This was symmetric, well-modulated, and attenuated with eye opening. Diffuse, symmetric, frontocentral beta range activity was present.  SLEEP:  N1 Sleep (Stage 1) was observed and characterized by the disappearance of  alpha rhythm and the appearance of vertex activity.   N2 Sleep (Stage 2) was observed and characterized by vertex waves, K-complexes, and sleep spindles.   N3 (Stage 3) sleep was observed and characterized by high amplitude Delta activity of 20%.   REM sleep was observed.   EKG:  There were no arrhythmias or abnormalities noted during this recording.   Impression:  Abnormal EEG due to left temporal sharps   Clinical Correlation:  This 3 day ambulatory EEG is suggestive of increase epileptogenic potential in the left temporal region. There were no seizures or events captured during this recording.    Lenny Fiumara, MD [facility]

## 2024-01-09 NOTE — Telephone Encounter (Signed)
 I called patient, she continues to have spells,   She is production designer, theatre/television/film at [employer], she has daily episode, when her brain is overstimulated, she gets slower, word finding difficulty. Was noticed by her coworker.  She tried Topamax  50mg  at bedtime, only a short while, did not change, no longer taking it.    This 3 day ambulatory EEG is suggestive of increase epileptogenic potential in the left temporal region. There were no seizures or events captured during this recording.   MRI brain in Sept 2025, was normal.   Meds ordered this encounter  Medications   LamoTRIgine (LAMICTAL XR) 25 MG TB24 24 hour tablet    Sig: One tab at bedtime xone week, 2 tabs at bedtime x2nd week 3 tabs x3rd week 4 tabs x4th week    Dispense:  120 tablet    Refill:  0      She will call at 4th week for progress report

## 2024-01-25 ENCOUNTER — Telehealth: Payer: Self-pay | Admitting: Neurology

## 2024-01-25 NOTE — Telephone Encounter (Signed)
 Patient called after-hours call service regarding a concern with her lamotrigine .  I was able to connect with the patient.  She reports that she was recently started on lamotrigine  by Dr. Onita. Of note, she has an appointment pending with Dr. Onita in February but has not seen her yet, transitioning from Dr. Ines.  She reports that she has a history of fever blisters and is beginning to have a blister on her lip.  She does not get the fever blisters very often and was concerned that it could have something to do with the Lamictal .  She is on 50 mg daily and was supposed to increase it to 75 mg today.  She has no other symptoms, no rash, no systemic symptoms, no fevers, no joint pain.  She is advised to monitor her symptoms, put a topical cream for herpes labialis.  She has some cream at home for it.  She is advised that if she wanted to play it safe, she could maintain at the 50 mg of lamotrigine  for a few days longer until the fever blisters clear up.  She was in agreement with this and agreeable to also monitoring her symptoms.  She is encouraged to check back in via MyChart message if need be next week.  She demonstrated understanding and agreement with the plan.

## 2024-01-28 NOTE — Telephone Encounter (Signed)
 I called patient, she has cold sore over the last few days, she did have a history of cold sore in the past,   On titrating dose of Lamotrigine  xr  25mg  x2 tabs,   She did notice some improvement over past few days,    Before lamotrigine  she has daily short episode of brain malfunction, word finding difficulty, super slow.  I encouraged her continue titrating lamotrigine  as planned.

## 2024-01-28 NOTE — Telephone Encounter (Signed)
Please see the MyChart message reply(ies) for my assessment and plan.    This patient gave consent for this Medical Advice Message and is aware that it may result in a bill to [employer], as well as the possibility of receiving a bill for a co-payment or deductible. They are an established patient, but are not seeking medical advice exclusively about a problem treated during an in person or video visit in the last seven days. I did not recommend an in person or video visit within seven days of my reply.    I spent a total of 10 minutes cumulative time within 7 days through [employer].  Marcial Pacas, MD

## 2024-02-14 ENCOUNTER — Encounter: Payer: Self-pay | Admitting: Endocrinology

## 2024-02-14 ENCOUNTER — Ambulatory Visit (INDEPENDENT_AMBULATORY_CARE_PROVIDER_SITE_OTHER): Payer: PRIVATE HEALTH INSURANCE | Admitting: Endocrinology

## 2024-02-14 ENCOUNTER — Other Ambulatory Visit: Payer: PRIVATE HEALTH INSURANCE

## 2024-02-14 VITALS — BP 124/90 | HR 85 | Ht 64.0 in | Wt 183.0 lb

## 2024-02-14 DIAGNOSIS — R7989 Other specified abnormal findings of blood chemistry: Secondary | ICD-10-CM

## 2024-02-14 DIAGNOSIS — N951 Menopausal and female climacteric states: Secondary | ICD-10-CM

## 2024-02-14 NOTE — Progress Notes (Unsigned)
 Outpatient Endocrinology Note Wanda Regnier, MD   [name]    DOB: 02/04/1971    MRN: [id number]  REASON OF VISIT: New consult for abnormal thyroid  function test/thyroid  disorder  REFERRING PROVIDER: Johnny Garnette DELENA, MD   PCP: Wanda Garnette DELENA, MD  HISTORY OF PRESENT ILLNESS:   [name] is a 53 y.o. old female with past medical history as listed below is presented for new consult for abnormal thyroid  function test/thyroid  disorder.   Pertinent Thyroid  History: Patient is referred to endocrinology for evaluation and management of concern about hypothyroidism with abnormal thyroid  function test/thyroid  disorder.  Initial consult on February 14, 2024.  Patient office note of primary care provider Dr. Johnny on August 21, 2023,  TSH was normal at 1.790 and free T4 was normal at 5.8. however the free T3 was low at 70.  These results were probably done in outside facility not available on the chart.  With her abnormal free T3 result and other symptoms suggestive of hypothyroidism was started on Armour Thyroid  60 mg daily.  With a chart review patient had normal thyroid  function test in the past in April 2025 and prior to that, TSH in April 2025 was 1.79.  She she already had normal TSH in the past including in 2022 she had normal TSH, free T4 and free T3.  Repeat thyroid  function test in August 2025 with mildly low TSH, normal total T4, normal free thyroxine index and low T3 uptake ratio.  She had normal thyroid  function test in October 2025 with normal TSH, normal free T3 and normal free T4.  Patient reports she was in her usual state of health until June and he started to have symptoms from around July 2025.  She had symptoms of low energy and weight gain.   She had also seen other medical provider regarding her menopausal symptoms, had taken progesterone, currently weaning off of.  She had also taken testosterone pellet she will plan not to continue anymore.  Patient  did not take estrogen due to having history of pulmonary embolism in the past.  She reports she had saliva test for estrogen which was undetectable.  Patient has history of hysterectomy without oophorectomy.  She had seen OB/GYN as well.  Patient complains of occasional palpitation.  She reports she also has a history of SVT.  Labs:   Latest Reference Range & Units 07/03/23 11:58 10/16/23 15:17 12/17/23 16:03  TSH 0.35 - 5.50 uIU/mL 1.79 (E) 0.242 (L) 0.38  Triiodothyronine,Free,Serum 2.3 - 4.2 pg/mL   3.2  T4,Free(Direct) 0.60 - 1.60 ng/dL   9.19  Thyroxine (T4) 4.5 - 12.0 ug/dL  5.8   Free Thyroxine Index 1.2 - 4.9   1.3   T3 Uptake Ratio 24 - 39 %  23 (L)   (L): Data is abnormally low (E): External lab result    Discussed and advised to follow-up with OB/GYN in regard to treatment for postmenopausal / perimenopausal symptoms including hormonal replacement.  Patient was taking Armour Thyroid  60 mg daily however lately she has been gradually weaning, and taking about 2-3 times a week.  REVIEW OF SYSTEMS:  As per history of present illness.   PAST MEDICAL HISTORY: Past Medical History:  Diagnosis Date   ADHD (attention deficit hyperactivity disorder)    pt does not take meds (diagnosed years ago)   Anemia    iron  deficiency    Blood transfusion without reported diagnosis    Chronic headaches  Chronic lumbar pain since 2013   sees Dr. Elouise [name]    Clotting disorder    PE prior to pregnancy.   Complication of anesthesia    Bp drops and alot of shaking after anesthesia   COVID-19 03/01/2020   Dysrhythmia 05/16/2021   SVT and hx palpitations   Family history of adverse reaction to anesthesia    Pt thinks her mother had some cardiac arrhythmias with anesthesia   Interstitial cystitis    Palpitations 02/19/2018   PE (pulmonary embolism) 2000   due to surgery   Pneumonia    x several years ago   PONV (postoperative nausea and vomiting)    Sepsis (HCC)    due to  surgery   Staph infection    Streptococcal pharyngitis 12/13/2022   Positive strep on 12/13/22, (2) treatments with Zinacef  x 10 days ea.  Last strep symptoms were on 01/01/23.    PAST SURGICAL HISTORY: Past Surgical History:  Procedure Laterality Date   ANTERIOR CERVICAL DECOMP/DISCECTOMY FUSION N/A 10/17/2021   Procedure: ANTERIOR CERVICAL DISCECTOMY AND FUSION FOUR TO SIX;  Surgeon: Wanda Aures, MD;  Location: [facility] OR;  Service: Orthopedics;  Laterality: N/A;   BREAST REDUCTION SURGERY     COLONOSCOPY  06/25/2019   per Dr. Leigh, adeomatous polyps, repeat in 3 yrs; hx multiple colonoscopies   DILATION AND CURETTAGE OF UTERUS     EXPLORATORY LAPAROTOMY     with left ovary and mass removed   RADIOFREQUENCY ABLATION NERVES     lumbar spine, per Dr. Hahn    REDUCTION MAMMAPLASTY     ROBOTIC ASSISTED LAPAROSCOPIC HYSTERECTOMY AND SALPINGECTOMY Bilateral 04/14/2020   Procedure: XI ROBOTIC ASSISTED TOTAL LAPAROSCOPIC HYSTERECTOMY AND SALPINGECTOMY;  Surgeon: [name], Marie-Lyne, MD;  Location: [facility] OR;  Service: Gynecology;  Laterality: Bilateral;   SHOULDER ARTHROSCOPY WITH SUBACROMIAL DECOMPRESSION Right 09/14/2020   Procedure: SHOULDER MANIPULATION UNDER ANESTHESIA, ARTHROSCOPIC LYSIS OF ADHESIONS AND SUBACROMIAL DECOMPRESSION;  Surgeon: Wanda Wanda Dover, MD;  Location: [facility] OR;  Service: Orthopedics;  Laterality: Right;    STRABISMUS SURGERY Bilateral 01/19/2023   Procedure: REPAIR STRABISMUS BILATERAL;  Surgeon: Wanda Factor, MD;  Location: [facility] OR;  Service: Ophthalmology;  Laterality: Bilateral;   TUBAL LIGATION     UPPER GASTROINTESTINAL ENDOSCOPY     x multiple    ALLERGIES: Allergies[1]  FAMILY HISTORY:  Family History  Problem Relation Age of Onset   Colon polyps Mother    Irritable bowel syndrome Mother    Heart defect Mother        tachycardia   Liver disease Father    Hypertension Father    COPD Father    Diabetes Sister    Hypertension Sister     Heart Problems Sister    Irritable bowel syndrome Paternal Aunt    Cancer Paternal Aunt        melanoma   Colon polyps Maternal Grandmother    Hypertension Maternal Grandmother    Diabetes Maternal Grandfather    Heart disease Maternal Grandfather    Hypertension Maternal Grandfather    Colon polyps Paternal Grandmother    Hypertension Paternal Grandmother    Heart defect Paternal Grandmother    Heart attack Paternal Grandmother    Diabetes Paternal Grandfather    Heart disease Paternal Grandfather    Hypertension Paternal Grandfather    Heart attack Paternal Grandfather    Colon cancer Neg Hx    Esophageal cancer Neg Hx    Rectal cancer Neg Hx    Stomach cancer  Neg Hx    Crohn's disease Neg Hx    Ulcerative colitis Neg Hx    Breast cancer Neg Hx    BRCA 1/2 Neg Hx     SOCIAL HISTORY: Social History   Socioeconomic History   Marital status: Married    Spouse name: Not on file   Number of children: 3   Years of education: Not on file   Highest education level: Not on file  Occupational History   Occupation: print production planner  Tobacco Use   Smoking status: Never   Smokeless tobacco: Never  Vaping Use   Vaping status: Never Used  Substance and Sexual Activity   Alcohol use: Not Currently    Comment: glass of wine occasionally (maybe once a month)   Drug use: No   Sexual activity: Yes    Partners: Male    Birth control/protection: Surgical    Comment: tubal ligation and hysterectomy  Other Topics Concern   Not on file  Social History Narrative   Lives at home with husband and 2 children   Right handed   Caffeine: 1-2 cups of coffee per day   Social Drivers of Health   Tobacco Use: Low Risk (02/14/2024)   Patient History    Smoking Tobacco Use: Never    Smokeless Tobacco Use: Never    Passive Exposure: Not on file  Financial Resource Strain: Not on file  Food Insecurity: Not on file  Transportation Needs: Not on file  Physical Activity: Not on file   Stress: Not on file  Social Connections: Not on file  Depression (PHQ2-9): Low Risk (08/23/2022)   Depression (PHQ2-9)    PHQ-2 Score: 2  Alcohol Screen: Not on file  Housing: Not on file  Utilities: Not on file  Health Literacy: Not on file    MEDICATIONS:  Current Outpatient Medications  Medication Sig Dispense Refill   acetaminophen  (TYLENOL ) 500 MG tablet Take 1,000 mg by mouth every 6 (six) hours as needed (pain.).     Ascorbic Acid (VITAMIN C PO) Take by mouth.     ibuprofen  (ADVIL ) 800 MG tablet Take 1 tablet (800 mg total) by mouth every 8 (eight) hours as needed for moderate pain (pain score 4-6). 60 tablet 5   LamoTRIgine  (LAMICTAL  XR) 25 MG TB24 24 hour tablet One tab at bedtime xone week, 2 tabs at bedtime x2nd week 3 tabs x3rd week 4 tabs x4th week 120 tablet 0   linaclotide  (LINZESS ) 145 MCG CAPS capsule Take 1 capsule (145 mcg total) by mouth daily before breakfast. 30 capsule 2   LORazepam  (ATIVAN ) 1 MG tablet TAKE 1 TABLET BY MOUTH EVERY NIGHT AT BEDTIME AS NEEDED FOR SLEEP 90 tablet 1   mirtazapine  (REMERON ) 15 MG tablet TAKE 1 TABLET(15 MG) BY MOUTH AT BEDTIME 90 tablet 3   Multiple Vitamins-Minerals (HAIR SKIN & NAILS PO) Take by mouth.     NON FORMULARY B1, B6, B12 injection weekly     ondansetron  (ZOFRAN ) 4 MG tablet Take 1 tablet (4 mg total) by mouth every 4 (four) hours as needed for nausea or vomiting. 60 tablet 2   Oral Electrolytes (ELECTROLYTE SR PO) Take 1 Dose by mouth daily as needed (hydration.).     Peppermint Oil (IBGARD) 90 MG CPCR Take as directed     Polyethyl Glycol-Propyl Glycol (SYSTANE) 0.4-0.3 % SOLN Place 1 drop into both eyes 3 (three) times daily as needed (dry/irritated eyes.).     polyethylene glycol (MIRALAX ) 17 g packet Take  17 g by mouth daily as needed.     PREBIOTIC PRODUCT PO Take by mouth.     Probiotic Product (PROBIOTIC PO) Take by mouth.     progesterone (PROMETRIUM) 200 MG capsule Take 200 mg by mouth at bedtime.      propranolol  (INDERAL ) 20 MG tablet Take 1 tablet (20 mg total) by mouth 2 (two) times daily. 180 tablet 3   scopolamine  (TRANSDERM SCOP , 1.5 MG,) 1 MG/3DAYS Place 1 patch (1.5 mg total) onto the skin every 3 (three) days. 10 patch 5   thyroid  (ARMOUR THYROID ) 60 MG tablet Take 1 tablet (60 mg total) by mouth daily before breakfast. 90 tablet 3   tretinoin (RETIN-A) 0.025 % cream Apply 1 Application topically every other day. At night.     UNABLE TO FIND Med Name: trace mineral (zinc,selenium cooper,manganese,chromium,molybdenum)     Vitamin D -Vitamin K (VITAMIN K2-VITAMIN D3 PO) Take by mouth.     omeprazole  (PRILOSEC) 20 MG capsule Take 1 capsule (20 mg total) by mouth daily. (Patient not taking: Reported on 02/14/2024) 90 capsule 1   No current facility-administered medications for this visit.    PHYSICAL EXAM: Vitals:   02/14/24 1518  BP: (!) 124/90  Pulse: 85  SpO2: 99%  Weight: 183 lb (83 kg)  Height: 5' 4 (1.626 m)   Body mass index is 31.41 kg/m.  Wt Readings from Last 3 Encounters:  02/14/24 183 lb (83 kg)  12/19/23 183 lb (83 kg)  10/29/23 178 lb (80.7 kg)    General: Well developed, well nourished female in no apparent distress.  HEENT: AT/Bier, no external lesions. Hearing intact to the spoken word Eyes: EOMI. No stare, proptosis or lid lag. Conjunctiva clear and no icterus. Neck: Trachea midline, neck supple without appreciable thyromegaly or lymphadenopathy and no palpable thyroid  nodules Neurologic: Alert, oriented, normal speech, deep tendon biceps reflexes normal,  no gross focal neurological deficit Extremities: No pedal pitting edema, no tremors of outstretched hands Skin: Warm, color good.  Psychiatric: Does not appear depressed or anxious  PERTINENT HISTORIC LABORATORY AND IMAGING STUDIES:  All pertinent laboratory results were reviewed. Please see HPI also for further details.   TSH  Date Value Ref Range Status  12/17/2023 0.38 0.35 - 5.50 uIU/mL Final   10/16/2023 0.242 (L) 0.450 - 4.500 uIU/mL Final  07/03/2023 1.79 0.41 - 5.90 Final     ASSESSMENT / PLAN  1. Abnormal thyroid  blood test   2. Perimenopausal symptom    Patient was started on Armour Thyroid  60 mg around middle of June 2025.  Based on chart review it was restarted due to abnormal/low free T3 and with symptoms suggesting hypothyroidism.  She had normal TSH and free T4, multiple times in the past, including in June 2025.  Discussed that abnormal free T3 especially T3 uptake, have been abnormal due to reagent used.  Main test for thyroid  function test is TSH and TSH has been mostly in the mid normal to low normal range, which indicates she has perfectly normal thyroid  function test.  She has other symptoms including weight gain, fatigue elevated blood pressure.  The symptoms can have multiple etiologies.  Lately she has been decreasing and trying to wean off Armour Thyroid .  She has history of hysterectomy with thyroidectomy.  She has some of the symptoms suggestive of perimenopausal symptoms as well.  Advised to follow-up with OB/GYN.  Plan: - Stop Armour Thyroid . - Check thyroid  function test today. - I would like to check estradiol  and FSH due to perimenopausal symptoms. - Will recheck thyroid  function test in 4 to 6 weeks, after not being on thyroid  medication.    [name] was seen today for establish care, weight gain and hyperthyroidism.  Diagnoses and all orders for this visit:  Abnormal thyroid  blood test -     T4, free -     T3, free -     TSH  Perimenopausal symptom -     Estradiol -     Follicle stimulating hormone    DISPOSITION Follow up in clinic in *** months suggested.  All questions answered and patient verbalized understanding of the plan.  Wanda Pense, MD [facility] Endocrinology [facility] [address] [place], [address] [place], [place] [address] Phone # [phone]  At least part of this note was generated using voice  recognition software. Inadvertent word errors may have occurred, which were not recognized during the proofreading process.       [1] No Known Allergies

## 2024-02-15 ENCOUNTER — Ambulatory Visit: Payer: Self-pay | Admitting: Endocrinology

## 2024-02-15 DIAGNOSIS — R7989 Other specified abnormal findings of blood chemistry: Secondary | ICD-10-CM

## 2024-02-15 LAB — TSH: TSH: 2.69 m[IU]/L

## 2024-02-15 LAB — T4, FREE: Free T4: 1 ng/dL (ref 0.8–1.8)

## 2024-02-15 LAB — ESTRADIOL: Estradiol: <30 pg/mL

## 2024-02-15 LAB — T3, FREE: T3, Free: 2.8 pg/mL (ref 2.3–4.2)

## 2024-02-15 LAB — FOLLICLE STIMULATING HORMONE: FSH: 120.5 m[IU]/mL — ABNORMAL HIGH

## 2024-02-18 ENCOUNTER — Encounter: Payer: Self-pay | Admitting: Family Medicine

## 2024-02-18 ENCOUNTER — Ambulatory Visit: Payer: PRIVATE HEALTH INSURANCE | Admitting: Family Medicine

## 2024-02-18 VITALS — BP 120/88 | HR 67 | Temp 98.8°F | Ht 64.0 in | Wt 177.0 lb

## 2024-02-18 DIAGNOSIS — R946 Abnormal results of thyroid function studies: Secondary | ICD-10-CM | POA: Insufficient documentation

## 2024-02-18 DIAGNOSIS — N951 Menopausal and female climacteric states: Secondary | ICD-10-CM

## 2024-02-18 MED ORDER — ESTROGENS CONJUGATED 0.3 MG PO TABS: 0.3000 mg | ORAL_TABLET | Freq: Every day | ORAL | 5 refills | Status: DC

## 2024-02-18 NOTE — Progress Notes (Signed)
° °  Subjective:    Patient ID: [name], female    DOB: 06-08-1970, 53 y.o.   MRN: [id number]  HPI Here to discuss her thyroid  issues and her menopausal symptoms. We had started her on Armour thyroid  for a recent low free T3, but she saw Dr. Sudan Thapa on 02-14-24 for an Endocrinology consult. He felt her normal TSH was the most important test result and that this proves her thyroid  function is normal. He told her to stop taking the Armour thyroid , and she has. Also, we have working with her for several months about how she feels bad in general with fatigue and body aches. She had spoken to her GYN about possible HRT, but she was hesitant to treat Marjoria with estrogen because of her hx of a PE. She was recently tested by Dr. Mercie for [facility] (which was elevated at 120.5) and estradiol (which was not measurable at <30). Today she asks if we could start her on HRT. She is S/P a hysterectomy.    Review of Systems  Constitutional:  Positive for fatigue.  Respiratory: Negative.    Cardiovascular: Negative.   Gastrointestinal: Negative.   Genitourinary: Negative.   Musculoskeletal:  Positive for myalgias.       Objective:   Physical Exam Constitutional:      Appearance: Normal appearance.  Cardiovascular:     Rate and Rhythm: Normal rate and regular rhythm.     Pulses: Normal pulses.     Heart sounds: Normal heart sounds.  Pulmonary:     Effort: Pulmonary effort is normal.     Breath sounds: Normal breath sounds.  Neurological:     Mental Status: She is alert.           Assessment & Plan:  Her thyroid  status is now felt to be normal, so she is off all thyroid  medications. She is having menopusal symptoms, so we agreed to start her low dose estrogen. She will take Premarin  0.3 mg daily. Follow up in 3 months.  Garnette Olmsted, MD

## 2024-03-25 ENCOUNTER — Other Ambulatory Visit: Payer: Self-pay | Admitting: Obstetrics and Gynecology

## 2024-03-25 DIAGNOSIS — Z1231 Encounter for screening mammogram for malignant neoplasm of breast: Secondary | ICD-10-CM

## 2024-03-27 ENCOUNTER — Other Ambulatory Visit: Payer: PRIVATE HEALTH INSURANCE

## 2024-03-28 LAB — T3, FREE: T3, Free: 2.3 pg/mL (ref 2.3–4.2)

## 2024-03-28 LAB — TSH: TSH: 4.54 m[IU]/L — ABNORMAL HIGH

## 2024-03-28 LAB — T4, FREE: Free T4: 1 ng/dL (ref 0.8–1.8)

## 2024-04-01 ENCOUNTER — Ambulatory Visit: Payer: PRIVATE HEALTH INSURANCE

## 2024-04-09 ENCOUNTER — Ambulatory Visit
Admission: RE | Admit: 2024-04-09 | Discharge: 2024-04-09 | Disposition: A | Payer: PRIVATE HEALTH INSURANCE | Source: Ambulatory Visit | Attending: Obstetrics and Gynecology | Admitting: Obstetrics and Gynecology

## 2024-04-09 DIAGNOSIS — Z1231 Encounter for screening mammogram for malignant neoplasm of breast: Secondary | ICD-10-CM

## 2024-04-10 ENCOUNTER — Ambulatory Visit: Payer: PRIVATE HEALTH INSURANCE | Admitting: Neurology

## 2024-04-10 ENCOUNTER — Encounter: Payer: Self-pay | Admitting: Neurology

## 2024-04-10 ENCOUNTER — Encounter: Payer: Self-pay | Admitting: Family Medicine

## 2024-04-10 VITALS — BP 117/73 | HR 75 | Ht 64.0 in | Wt 165.5 lb

## 2024-04-10 DIAGNOSIS — G43E09 Chronic migraine with aura, not intractable, without status migrainosus: Secondary | ICD-10-CM | POA: Insufficient documentation

## 2024-04-10 MED ORDER — ZONISAMIDE 100 MG PO CAPS: 100.0000 mg | ORAL_CAPSULE | Freq: Every day | ORAL | 11 refills | Status: AC

## 2024-04-10 MED ORDER — SUMATRIPTAN SUCCINATE 50 MG PO TABS: 50.0000 mg | ORAL_TABLET | ORAL | 11 refills | Status: AC | PRN

## 2024-04-10 MED ORDER — ESTRADIOL 0.5 MG PO TABS: 0.5000 mg | ORAL_TABLET | Freq: Every day | ORAL | 3 refills | Status: AC

## 2024-04-10 MED ORDER — ZONISAMIDE 25 MG PO CAPS: ORAL_CAPSULE | ORAL | 0 refills | Status: AC

## 2024-04-10 NOTE — Progress Notes (Signed)
 "  Chief Complaint  Patient presents with   Follow-up    Pt in room 14. Alone. Here for seziure follow up      ASSESSMENT AND PLAN  [name] is a 54 y.o. female  Chronic migraine headache with aura Intermittent confusion episode  In the setting of menopause, chronic insomnia, anxiety sometimes  EEG only showed mild abnormality, there was occasionally left temporal sharp waves during sleep,  The described confusion episode is not typical for partial seizure, we will continue to observe  Try low-dose zonisamide  low-dose as migraine prevention, titrating to 100 mg every night  Imitrex  as needed  We also discussed increased vascular event for patient chronic migraine with aura taking estrogen, she will continue to discuss estrogen supplement with her primary care physician   DIAGNOSTIC DATA (LABS, IMAGING, TESTING) - I reviewed patient records, labs, notes, testing and imaging myself where available.   MEDICAL HISTORY:  [name], is a 54 year old female, follow-up for chronic migraine, was seen by Dr. Ines in the past, her primary care is Dr. Johnny, Garn[place]   History is obtained from the patient and review of electronic medical records. I personally reviewed pertinent available imaging films in PACS.   PMHx of  Chronic insomnia Chronic migraine. HX of PE,  History of cervical decompression in 2023  She had long history of chronic migraine headache, now it happen almost once a week, pressure, sometimes pounding headache with light, noise sensitivity, lasting for couple hours, tried over-the-counter Tylenol , ibuprofen , electrolyte drinks, helpful most of the time, but can be longer lasting  She often describes visual aura preceding headache, decreased peripheral visual field, sometimes color pattern in her peripheral visual field before the onset of the headache  She is going through menopause, recently started on estrogen supplement  Since July 2025, she began  to have intermittent episode of sudden onset slow thinking, word finding difficulties, she can still carry on conversation, but much slower speech, mild confusion, for that reason, she had 3 days ambulatory EEG October 24 to 27, 2025, patient reported couple events during the recording, at daytime, but she did not push button, there was reported occasionally left temporal sharp wave seen during sleep,  She was given a trial of lamotrigine  ER 25 mg at the beginning of November, while taking 50 mg every night, 2 weeks into taking lamotrigine , she began to notice frequent breakout of cold sore, much more than her baseline, thinking might be related to lamotrigine , she has stopped taking it, lamotrigine  was helping her spells of slow thinking  She was under different clinic for management of her migraine, previously tried and failed topiramate , nortriptyline,  MRI of the brain with without contrast in September 2025 was normal  She is under the care of of endocrinologist for mild abnormal thyroid  functional test,  She also complains of left hip pain, under the care of orthopedic surgeon    PHYSICAL EXAM:   Vitals:   04/10/24 0903  BP: 117/73  Pulse: 75  SpO2: 98%  Weight: 165 lb 8 oz (75.1 kg)  Height: 5' 4 (1.626 m)    Body mass index is 28.41 kg/m.  PHYSICAL EXAMNIATION:  Gen: NAD, conversant, well nourised, well groomed                     Cardiovascular: Regular rate rhythm, no peripheral edema, warm, nontender. Eyes: Conjunctivae clear without exudates or hemorrhage Neck: Supple, no carotid bruits. Pulmonary: Clear to auscultation  bilaterally   NEUROLOGICAL EXAM:  MENTAL STATUS: Speech/cognition: Awake, alert, oriented to history taking and casual conversation CRANIAL NERVES: CN II: Visual fields are full to confrontation. Pupils are round equal and briskly reactive to light. CN III, IV, VI: extraocular movement are normal. No ptosis. CN V: Facial sensation is intact to  light touch CN VII: Face is symmetric with normal eye closure  CN VIII: Hearing is normal to causal conversation. CN IX, X: Phonation is normal. CN XI: Head turning and shoulder shrug are intact  MOTOR: There is no pronator drift of out-stretched arms. Muscle bulk and tone are normal. Muscle strength is normal.  REFLEXES: Reflexes are 2+ and symmetric at the biceps, triceps, knees, and ankles. Plantar responses are flexor.  SENSORY: Intact to light touch, pinprick and vibratory sensation are intact in fingers and toes.  COORDINATION: There is no trunk or limb dysmetria noted.  GAIT/STANCE: Push-up, mildly antalgic  REVIEW OF SYSTEMS:  Full 14 system review of systems performed and notable only for as above All other review of systems were negative.   ALLERGIES: Allergies[1]  HOME MEDICATIONS: Current Outpatient Medications  Medication Sig Dispense Refill   acetaminophen  (TYLENOL ) 500 MG tablet Take 1,000 mg by mouth every 6 (six) hours as needed (pain.).     Ascorbic Acid (VITAMIN C PO) Take by mouth.     estrogens , conjugated, (PREMARIN ) 0.3 MG tablet Take 1 tablet (0.3 mg total) by mouth daily. Take daily for 21 days then do not take for 7 days. 30 tablet 5   ibuprofen  (ADVIL ) 800 MG tablet Take 1 tablet (800 mg total) by mouth every 8 (eight) hours as needed for moderate pain (pain score 4-6). 60 tablet 5   linaclotide  (LINZESS ) 145 MCG CAPS capsule Take 1 capsule (145 mcg total) by mouth daily before breakfast. 30 capsule 2   LORazepam  (ATIVAN ) 1 MG tablet TAKE 1 TABLET BY MOUTH EVERY NIGHT AT BEDTIME AS NEEDED FOR SLEEP 90 tablet 1   mirtazapine  (REMERON ) 15 MG tablet TAKE 1 TABLET(15 MG) BY MOUTH AT BEDTIME 90 tablet 3   Multiple Vitamins-Minerals (HAIR SKIN & NAILS PO) Take by mouth.     NON FORMULARY B1, B6, B12 injection weekly     ondansetron  (ZOFRAN ) 4 MG tablet Take 1 tablet (4 mg total) by mouth every 4 (four) hours as needed for nausea or vomiting. 60 tablet 2    Oral Electrolytes (ELECTROLYTE SR PO) Take 1 Dose by mouth daily as needed (hydration.).     Peppermint Oil (IBGARD) 90 MG CPCR Take as directed     Polyethyl Glycol-Propyl Glycol (SYSTANE) 0.4-0.3 % SOLN Place 1 drop into both eyes 3 (three) times daily as needed (dry/irritated eyes.).     polyethylene glycol (MIRALAX ) 17 g packet Take 17 g by mouth daily as needed.     PREBIOTIC PRODUCT PO Take by mouth.     Probiotic Product (PROBIOTIC PO) Take by mouth.     propranolol  (INDERAL ) 20 MG tablet Take 1 tablet (20 mg total) by mouth 2 (two) times daily. 180 tablet 3   scopolamine  (TRANSDERM SCOP , 1.5 MG,) 1 MG/3DAYS Place 1 patch (1.5 mg total) onto the skin every 3 (three) days. (Patient taking differently: Place 1 patch onto the skin every 3 (three) days. As needed only) 10 patch 5   thyroid  (ARMOUR THYROID ) 60 MG tablet Take 1 tablet (60 mg total) by mouth daily before breakfast. 90 tablet 3   tretinoin (RETIN-A) 0.025 % cream Apply 1  Application topically every other day. At night.     UNABLE TO FIND Med Name: trace mineral (zinc,selenium cooper,manganese,chromium,molybdenum)     Vitamin D -Vitamin K (VITAMIN K2-VITAMIN D3 PO) Take by mouth.     LamoTRIgine  (LAMICTAL  XR) 25 MG TB24 24 hour tablet One tab at bedtime xone week, 2 tabs at bedtime x2nd week 3 tabs x3rd week 4 tabs x4th week (Patient not taking: Reported on 04/10/2024) 120 tablet 0   progesterone (PROMETRIUM) 200 MG capsule Take 200 mg by mouth at bedtime. (Patient not taking: Reported on 04/10/2024)     No current facility-administered medications for this visit.    PAST MEDICAL HISTORY: Past Medical History:  Diagnosis Date   ADHD (attention deficit hyperactivity disorder)    pt does not take meds (diagnosed years ago)   Anemia    iron  deficiency    Blood transfusion without reported diagnosis    Chronic headaches    Chronic lumbar pain since 2013   sees Dr. Elouise Hahn    Clotting disorder    PE prior to pregnancy.    Complication of anesthesia    Bp drops and alot of shaking after anesthesia   COVID-19 03/01/2020   Dysrhythmia 05/16/2021   SVT and hx palpitations   Family history of adverse reaction to anesthesia    Pt thinks her mother had some cardiac arrhythmias with anesthesia   Interstitial cystitis    Palpitations 02/19/2018   PE (pulmonary embolism) 2000   due to surgery   Pneumonia    x several years ago   PONV (postoperative nausea and vomiting)    Sepsis (HCC)    due to surgery   Staph infection    Streptococcal pharyngitis 12/13/2022   Positive strep on 12/13/22, (2) treatments with Zinacef  x 10 days ea.  Last strep symptoms were on 01/01/23.    PAST SURGICAL HISTORY: Past Surgical History:  Procedure Laterality Date   ANTERIOR CERVICAL DECOMP/DISCECTOMY FUSION N/A 10/17/2021   Procedure: ANTERIOR CERVICAL DISCECTOMY AND FUSION FOUR TO SIX;  Surgeon: Burnetta Aures, MD;  Location: [facility] OR;  Service: Orthopedics;  Laterality: N/A;   BREAST REDUCTION SURGERY     COLONOSCOPY  06/25/2019   per Dr. Leigh, adeomatous polyps, repeat in 3 yrs; hx multiple colonoscopies   DILATION AND CURETTAGE OF UTERUS     EXPLORATORY LAPAROTOMY     with left ovary and mass removed   RADIOFREQUENCY ABLATION NERVES     lumbar spine, per Dr. Hahn    REDUCTION MAMMAPLASTY     ROBOTIC ASSISTED LAPAROSCOPIC HYSTERECTOMY AND SALPINGECTOMY Bilateral 04/14/2020   Procedure: XI ROBOTIC ASSISTED TOTAL LAPAROSCOPIC HYSTERECTOMY AND SALPINGECTOMY;  Surgeon: Lavoie, Marie-Lyne, MD;  Location: [facility] OR;  Service: Gynecology;  Laterality: Bilateral;   SHOULDER ARTHROSCOPY WITH SUBACROMIAL DECOMPRESSION Right 09/14/2020   Procedure: SHOULDER MANIPULATION UNDER ANESTHESIA, ARTHROSCOPIC LYSIS OF ADHESIONS AND SUBACROMIAL DECOMPRESSION;  Surgeon: Sharl Selinda Dover, MD;  Location: [facility] OR;  Service: Orthopedics;  Laterality: Right;    STRABISMUS SURGERY Bilateral 01/19/2023   Procedure: REPAIR STRABISMUS  BILATERAL;  Surgeon: Tobie Factor, MD;  Location: [facility] OR;  Service: Ophthalmology;  Laterality: Bilateral;   TUBAL LIGATION     UPPER GASTROINTESTINAL ENDOSCOPY     x multiple    FAMILY HISTORY: Family History  Problem Relation Age of Onset   Colon polyps Mother    Irritable bowel syndrome Mother    Heart defect Mother        tachycardia   Liver disease Father  Hypertension Father    COPD Father    Diabetes Sister    Hypertension Sister    Heart Problems Sister    Irritable bowel syndrome Paternal Aunt    Cancer Paternal Aunt        melanoma   Colon polyps Maternal Grandmother    Hypertension Maternal Grandmother    Diabetes Maternal Grandfather    Heart disease Maternal Grandfather    Hypertension Maternal Grandfather    Colon polyps Paternal Grandmother    Hypertension Paternal Grandmother    Heart defect Paternal Grandmother    Heart attack Paternal Grandmother    Diabetes Paternal Grandfather    Heart disease Paternal Grandfather    Hypertension Paternal Grandfather    Heart attack Paternal Grandfather    Colon cancer Neg Hx    Esophageal cancer Neg Hx    Rectal cancer Neg Hx    Stomach cancer Neg Hx    Crohn's disease Neg Hx    Ulcerative colitis Neg Hx    Breast cancer Neg Hx    BRCA 1/2 Neg Hx     SOCIAL HISTORY: Social History   Socioeconomic History   Marital status: Married    Spouse name: Not on file   Number of children: 3   Years of education: Not on file   Highest education level: Not on file  Occupational History   Occupation: print production planner  Tobacco Use   Smoking status: Never   Smokeless tobacco: Never  Vaping Use   Vaping status: Never Used  Substance and Sexual Activity   Alcohol use: Not Currently    Comment: glass of wine occasionally (maybe once a month)   Drug use: No   Sexual activity: Yes    Partners: Male    Birth control/protection: Surgical    Comment: tubal ligation and hysterectomy  Other Topics Concern   Not on  file  Social History Narrative   Lives at home with husband and 2 children   Right handed   Caffeine: 1-2 cups of coffee per day   Social Drivers of Health   Tobacco Use: Low Risk (04/10/2024)   Patient History    Smoking Tobacco Use: Never    Smokeless Tobacco Use: Never    Passive Exposure: Not on file  Financial Resource Strain: Not on file  Food Insecurity: Not on file  Transportation Needs: Not on file  Physical Activity: Not on file  Stress: Not on file  Social Connections: Not on file  Intimate Partner Violence: Not on file  Depression (PHQ2-9): Low Risk (08/23/2022)   Depression (PHQ2-9)    PHQ-2 Score: 2  Alcohol Screen: Not on file  Housing: Not on file  Utilities: Not on file  Health Literacy: Not on file      Modena Callander, M.D. Ph.D.  [facility] [address] [place], [place] [address] Ph: [phone] Fax: [phone]  CC:  Johnny Garnette LABOR, MD [address] [place],  [place] [address]  Johnny Garnette LABOR, MD    I personally spent a total of 40 minutes in the care of the patient today including preparing to see the patient, getting/reviewing separately obtained history, performing a medically appropriate exam/evaluation, counseling and educating, placing orders, documenting clinical information in the EHR, independently interpreting results, and communicating results.      [1] No Known Allergies  "

## 2024-04-10 NOTE — Telephone Encounter (Signed)
 I sent in for the Estradiol  tablet

## 2024-04-11 MED ORDER — VALACYCLOVIR HCL 1 G PO TABS: 1000.0000 mg | ORAL_TABLET | Freq: Two times a day (BID) | ORAL | 0 refills | Status: AC

## 2024-04-11 NOTE — Telephone Encounter (Signed)
 I sent in for her to take Valtrex  twice daily for 30 days to get this under control. Then hopefully we can change to a smaller daily dose

## 2024-05-15 ENCOUNTER — Other Ambulatory Visit: Payer: PRIVATE HEALTH INSURANCE

## 2024-05-19 ENCOUNTER — Ambulatory Visit: Payer: PRIVATE HEALTH INSURANCE | Admitting: Endocrinology

## 2024-10-13 ENCOUNTER — Ambulatory Visit: Payer: PRIVATE HEALTH INSURANCE | Admitting: Neurology
